# Patient Record
Sex: Male | Born: 1965 | Race: White | Hispanic: No | Marital: Married | Smoking: Former smoker
Health system: Southern US, Community
[De-identification: ages and names within clinical notes are randomized; demographics above are authoritative.]

## PROBLEM LIST (undated history)

## (undated) DIAGNOSIS — M1990 Unspecified osteoarthritis, unspecified site: Secondary | ICD-10-CM

## (undated) DIAGNOSIS — K7460 Unspecified cirrhosis of liver: Secondary | ICD-10-CM

## (undated) DIAGNOSIS — F329 Major depressive disorder, single episode, unspecified: Secondary | ICD-10-CM

## (undated) DIAGNOSIS — E119 Type 2 diabetes mellitus without complications: Secondary | ICD-10-CM

## (undated) DIAGNOSIS — K429 Umbilical hernia without obstruction or gangrene: Secondary | ICD-10-CM

## (undated) DIAGNOSIS — F32A Depression, unspecified: Secondary | ICD-10-CM

## (undated) HISTORY — PX: KNEE SURGERY: SHX244

## (undated) HISTORY — PX: HAND SURGERY: SHX662

## (undated) HISTORY — PX: FOOT SURGERY: SHX648

---

## 2003-01-01 ENCOUNTER — Emergency Department (HOSPITAL_COMMUNITY): Admission: EM | Admit: 2003-01-01 | Discharge: 2003-01-01 | Payer: Self-pay | Admitting: Emergency Medicine

## 2011-01-25 ENCOUNTER — Emergency Department (HOSPITAL_COMMUNITY)
Admission: EM | Admit: 2011-01-25 | Discharge: 2011-01-25 | Disposition: A | Discharge status: Home / Self Care | Payer: Medicaid Other | Attending: Emergency Medicine | Admitting: Emergency Medicine

## 2011-01-25 ENCOUNTER — Emergency Department (HOSPITAL_COMMUNITY): Payer: Medicaid Other

## 2011-01-25 DIAGNOSIS — Y92009 Unspecified place in unspecified non-institutional (private) residence as the place of occurrence of the external cause: Secondary | ICD-10-CM | POA: Insufficient documentation

## 2011-01-25 DIAGNOSIS — IMO0002 Reserved for concepts with insufficient information to code with codable children: Secondary | ICD-10-CM | POA: Insufficient documentation

## 2011-01-25 DIAGNOSIS — S6000XA Contusion of unspecified finger without damage to nail, initial encounter: Secondary | ICD-10-CM | POA: Insufficient documentation

## 2011-01-25 IMAGING — CR DG FINGER MIDDLE 2+V*R*
2 series · 2 of 2 positions shown · non-contrast
Comparison: None.

CLINICAL DATA: Trauma to the middle finger.  Pain.  The soft tissue
swelling

RIGHT MIDDLE FINGER 2+V

[view not recorded (1 of 2)]
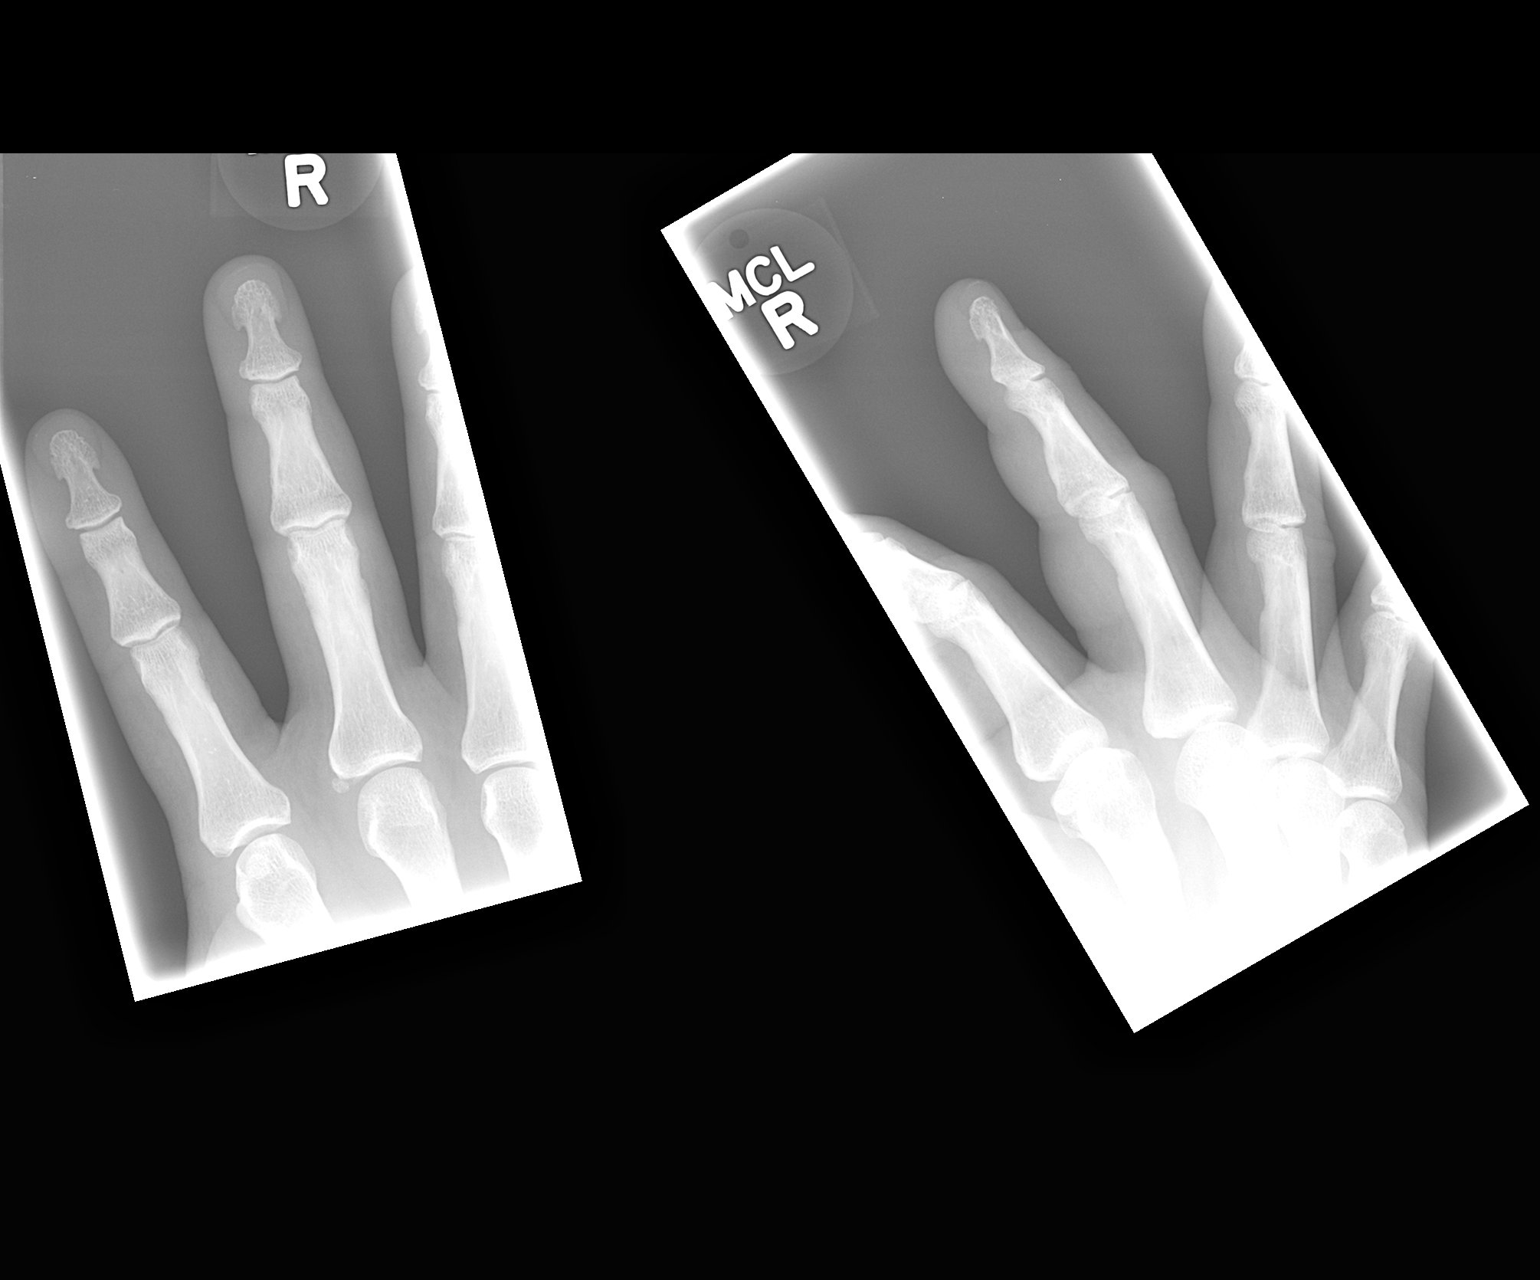

[view not recorded (2 of 2)]
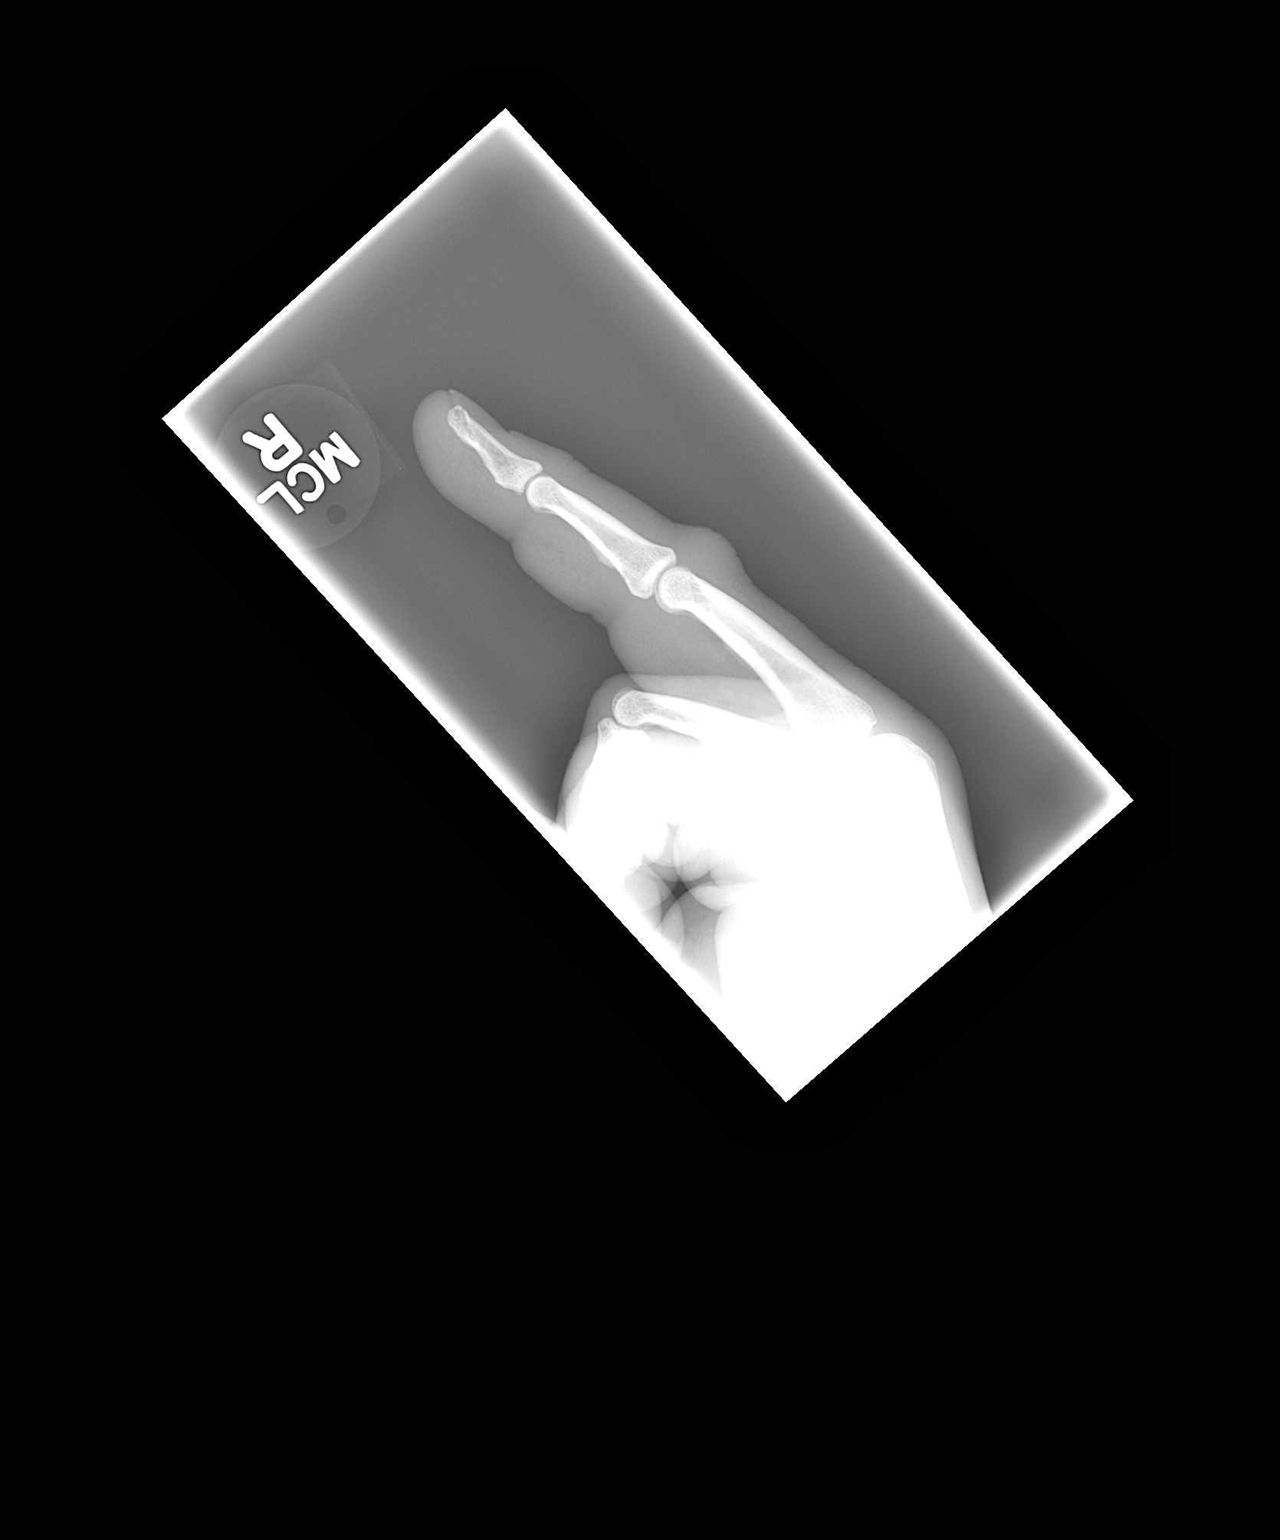

[2 of 2 positions shown; findings below may reference images not displayed]

FINDINGS: Soft tissue swelling is present over the dorsal aspect of
the PIP joint without underlying fracture dislocation.  No
radiopaque foreign body is evident.
IMPRESSION: 1.  Soft tissue swelling over the dorsum of the PIP joint.
2.  No underlying fracture or radiopaque foreign body.

## 2011-10-03 ENCOUNTER — Ambulatory Visit (INDEPENDENT_AMBULATORY_CARE_PROVIDER_SITE_OTHER): Payer: Medicaid Other | Admitting: Otolaryngology

## 2011-10-03 DIAGNOSIS — H905 Unspecified sensorineural hearing loss: Secondary | ICD-10-CM

## 2012-03-19 ENCOUNTER — Emergency Department (HOSPITAL_COMMUNITY): Payer: Medicaid Other

## 2012-03-19 ENCOUNTER — Emergency Department (HOSPITAL_COMMUNITY)
Admission: EM | Admit: 2012-03-19 | Discharge: 2012-03-19 | Disposition: A | Discharge status: Home / Self Care | Payer: Medicaid Other | Attending: Emergency Medicine | Admitting: Emergency Medicine

## 2012-03-19 ENCOUNTER — Encounter (HOSPITAL_COMMUNITY): Payer: Self-pay

## 2012-03-19 DIAGNOSIS — M25551 Pain in right hip: Secondary | ICD-10-CM

## 2012-03-19 DIAGNOSIS — L255 Unspecified contact dermatitis due to plants, except food: Secondary | ICD-10-CM

## 2012-03-19 DIAGNOSIS — IMO0002 Reserved for concepts with insufficient information to code with codable children: Secondary | ICD-10-CM | POA: Insufficient documentation

## 2012-03-19 DIAGNOSIS — M25559 Pain in unspecified hip: Secondary | ICD-10-CM | POA: Insufficient documentation

## 2012-03-19 HISTORY — DX: Unspecified osteoarthritis, unspecified site: M19.90

## 2012-03-19 IMAGING — CR DG HIP COMPLETE 2+V*R*
3 series · 3 of 3 positions shown · non-contrast
Comparison: None

CLINICAL DATA: Right hip pain, no known injury

RIGHT HIP - COMPLETE 2+ VIEW

[view not recorded (1 of 3)]
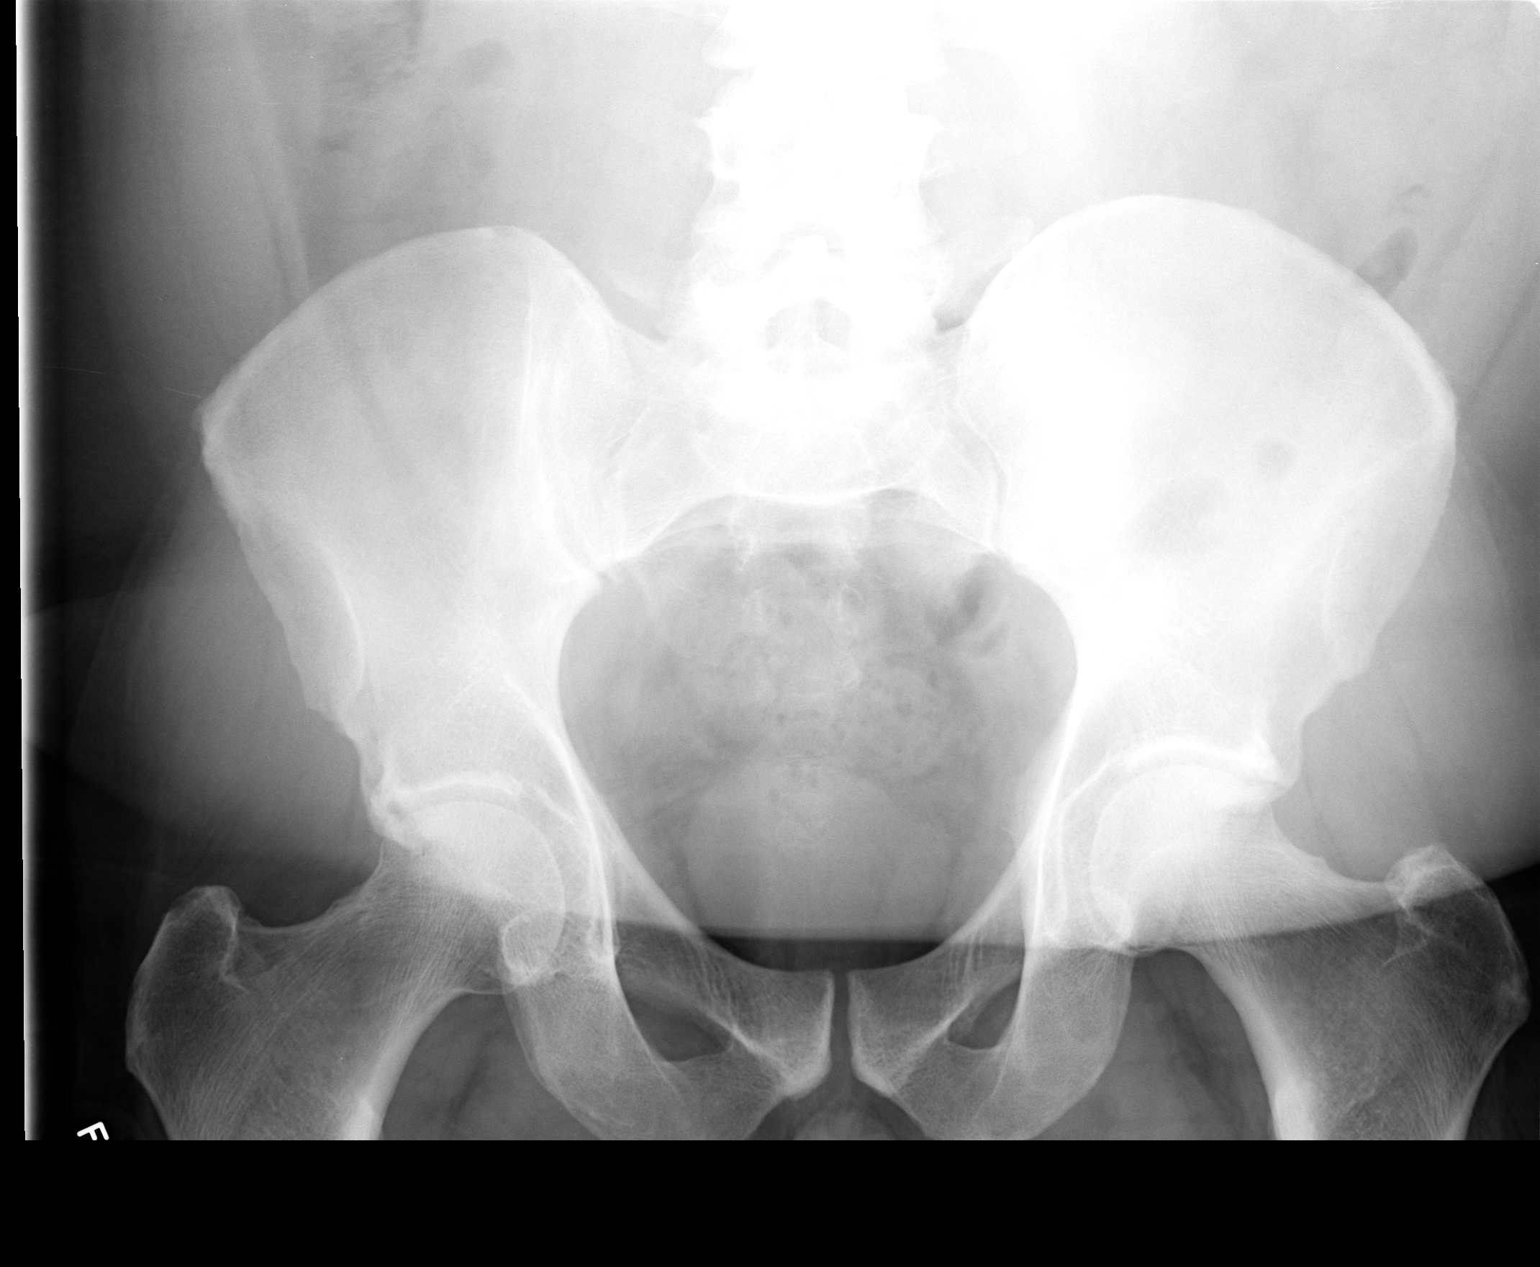

[view not recorded (2 of 3)]
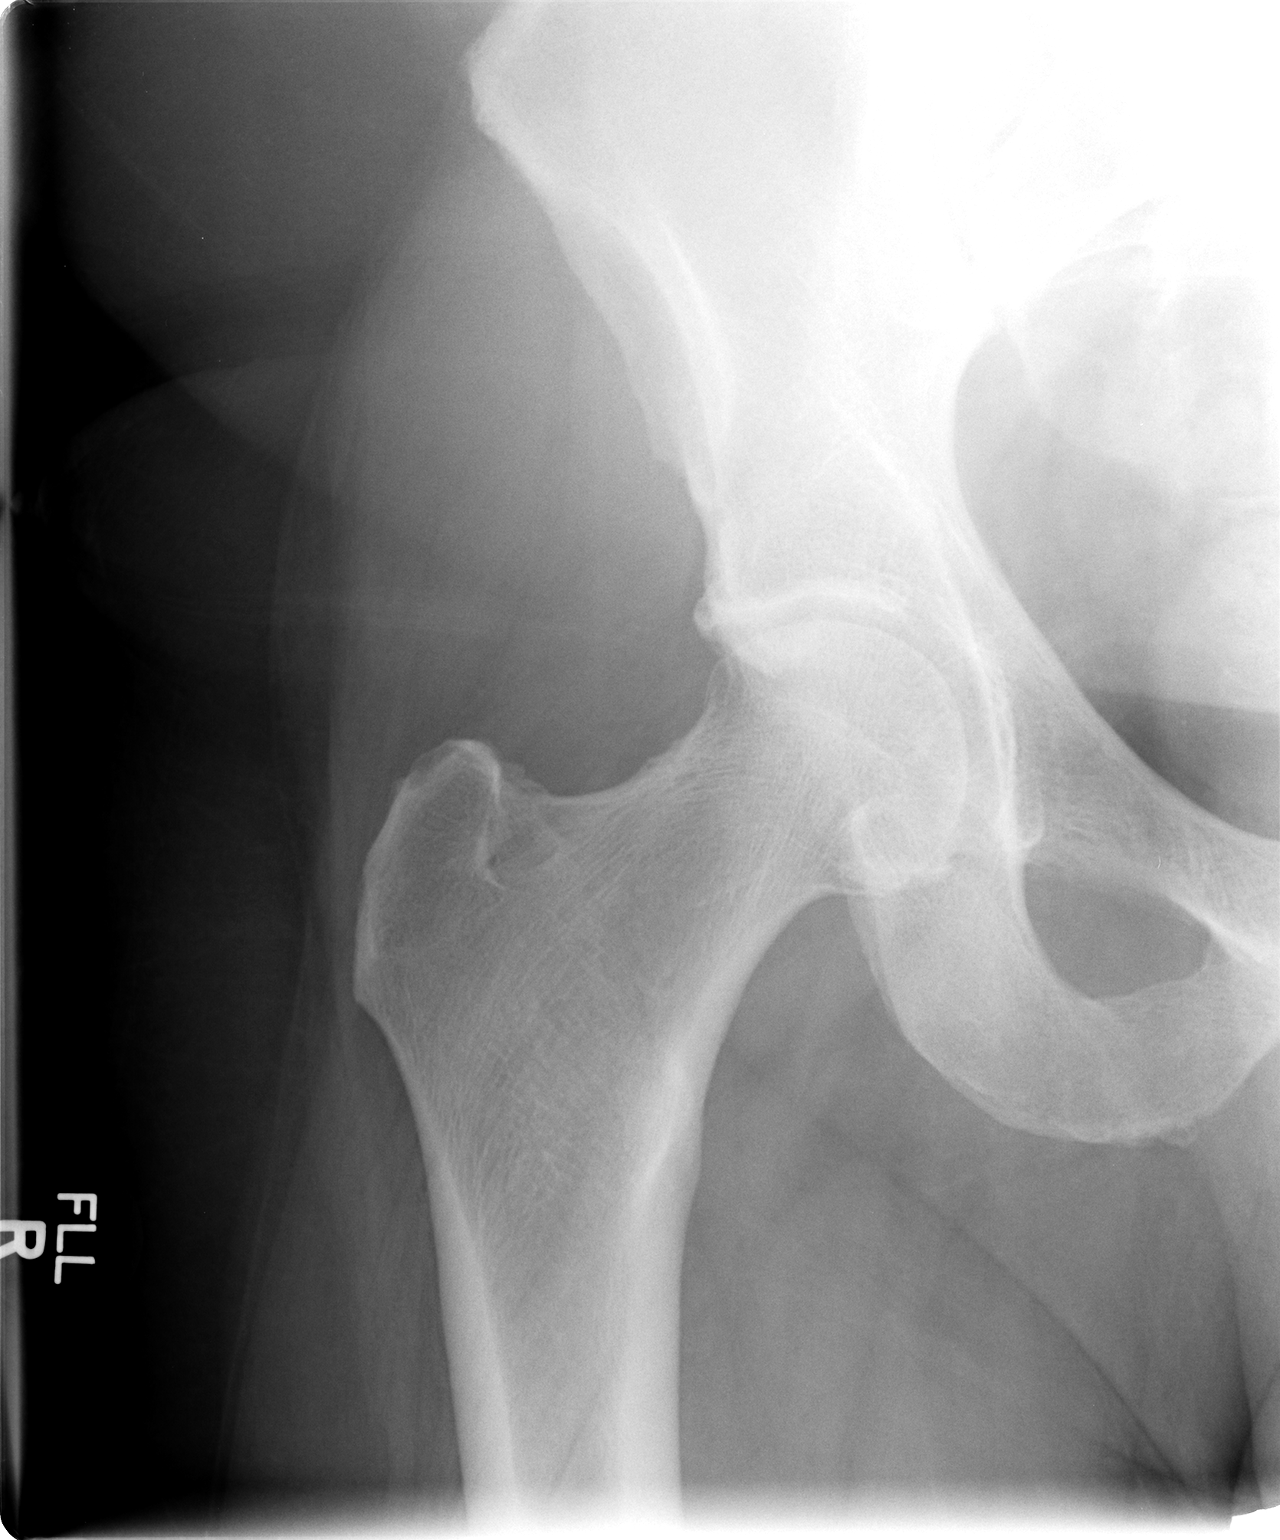

[view not recorded (3 of 3)]
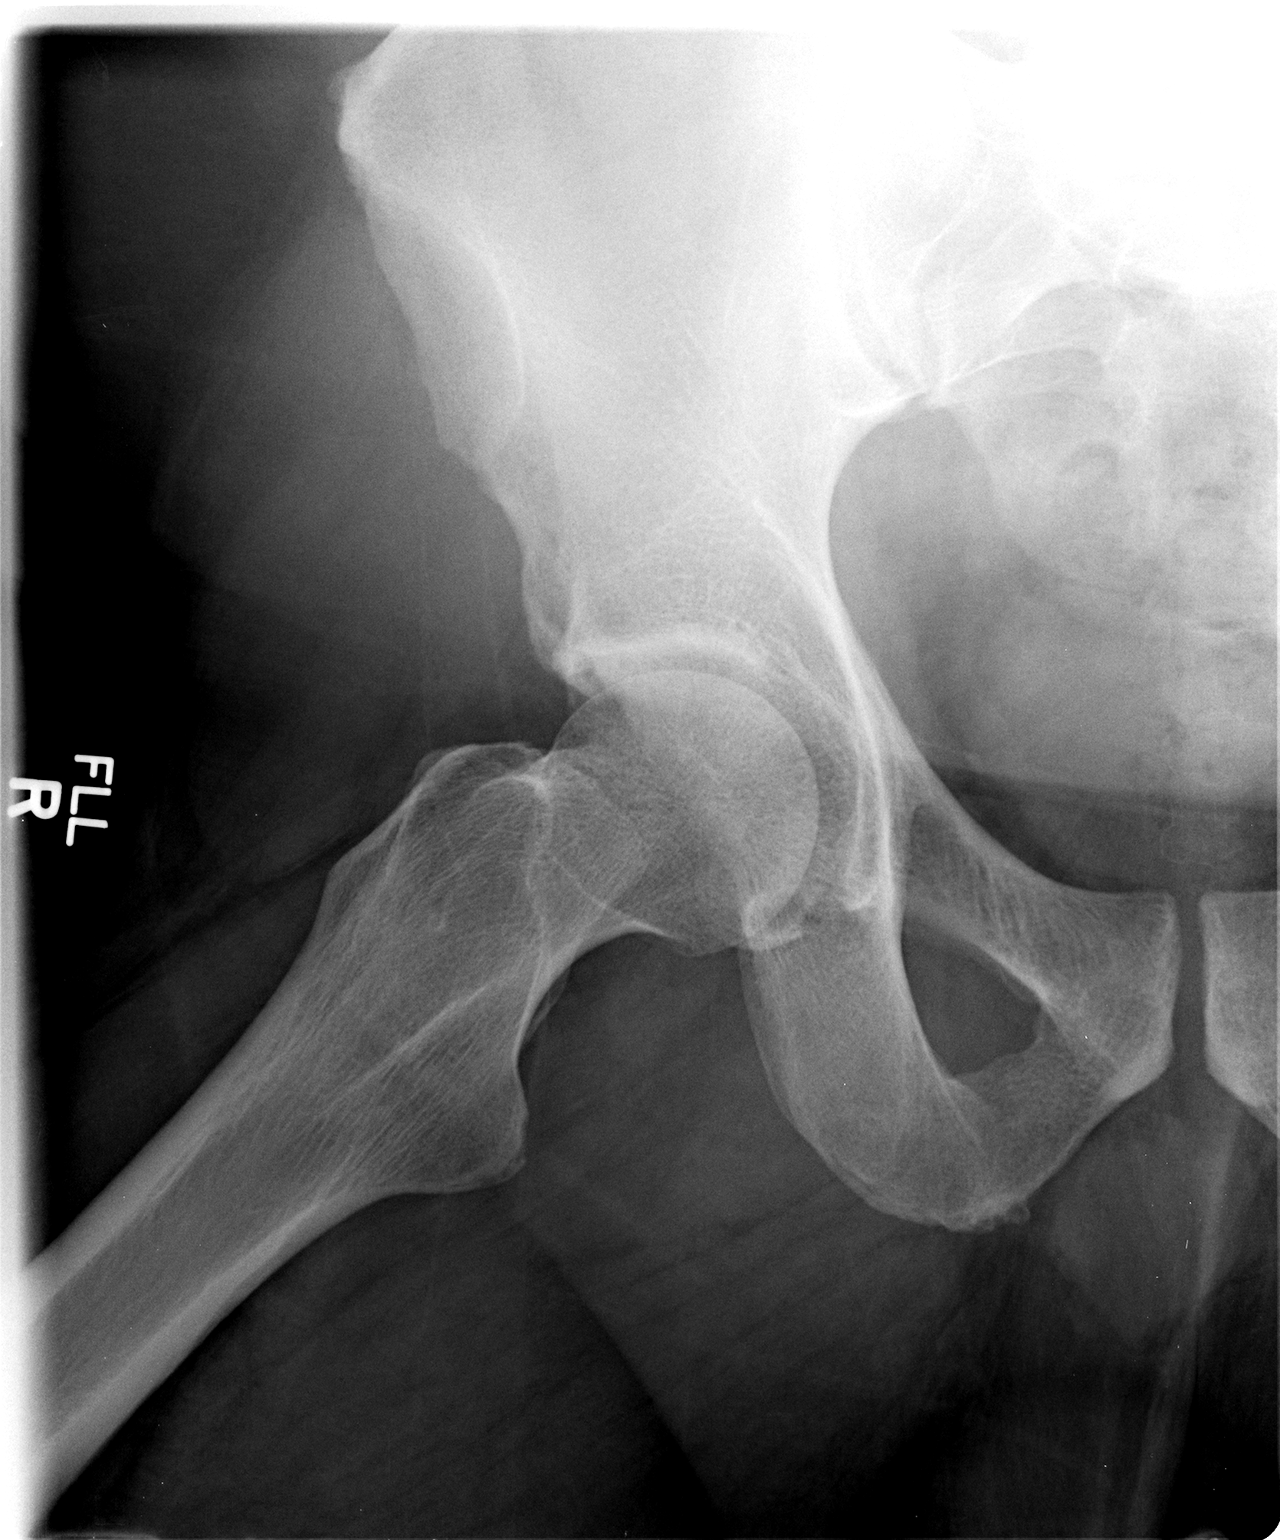

[3 of 3 positions shown; findings below may reference images not displayed]

FINDINGS: Symmetric hip and SI joints.
Osseous mineralization normal.
No acute fracture, dislocation or bone destruction.
Regional soft tissues unremarkable.
IMPRESSION: Normal exam.

## 2012-03-19 MED ORDER — PREDNISONE 20 MG PO TABS
60.0000 mg | ORAL_TABLET | Freq: Once | ORAL | Status: AC
  Administered 2012-03-19: 60 mg via ORAL
  Filled 2012-03-19: qty 3

## 2012-03-19 MED ORDER — PREDNISONE 50 MG PO TABS: ORAL_TABLET | ORAL | Status: AC

## 2012-03-19 NOTE — Discharge Instructions (Signed)
Arthralgia Your caregiver has diagnosed you as suffering from an arthralgia. Arthralgia means there is pain in a joint. This can come from many reasons including:  Bruising the joint which causes soreness (inflammation) in the joint.   Wear and tear on the joints which occur as we grow older (osteoarthritis).   Overusing the joint.   Various forms of arthritis.   Infections of the joint.  Regardless of the cause of pain in your joint, most of these different pains respond to anti-inflammatory drugs and rest. The exception to this is when a joint is infected, and these cases are treated with antibiotics, if it is a bacterial infection. HOME CARE INSTRUCTIONS   Rest the injured area for as long as directed by your caregiver. Then slowly start using the joint as directed by your caregiver and as the pain allows. Crutches as directed may be useful if the ankles, knees or hips are involved. If the knee was splinted or casted, continue use and care as directed. If an stretchy or elastic wrapping bandage has been applied today, it should be removed and re-applied every 3 to 4 hours. It should not be applied tightly, but firmly enough to keep swelling down. Watch toes and feet for swelling, bluish discoloration, coldness, numbness or excessive pain. If any of these problems (symptoms) occur, remove the ace bandage and re-apply more loosely. If these symptoms persist, contact your caregiver or return to this location.   For the first 24 hours, keep the injured extremity elevated on pillows while lying down.   Apply ice for 15 to 20 minutes to the sore joint every couple hours while awake for the first half day. Then 3 to 4 times per day for the first 48 hours. Put the ice in a plastic bag and place a towel between the bag of ice and your skin.   Wear any splinting, casting, elastic bandage applications, or slings as instructed.   Only take over-the-counter or prescription medicines for pain,  discomfort, or fever as directed by your caregiver. Do not use aspirin immediately after the injury unless instructed by your physician. Aspirin can cause increased bleeding and bruising of the tissues.   If you were given crutches, continue to use them as instructed and do not resume weight bearing on the sore joint until instructed.  Persistent pain and inability to use the sore joint as directed for more than 2 to 3 days are warning signs indicating that you should see a caregiver for a follow-up visit as soon as possible. Initially, a hairline fracture (break in bone) may not be evident on X-rays. Persistent pain and swelling indicate that further evaluation, non-weight bearing or use of the joint (use of crutches or slings as instructed), or further X-rays are indicated. X-rays may sometimes not show a small fracture until a week or 10 days later. Make a follow-up appointment with your own caregiver or one to whom we have referred you. A radiologist (specialist in reading X-rays) may read your X-rays. Make sure you know how you are to obtain your X-ray results. Do not assume everything is normal if you do not hear from us. SEEK MEDICAL CARE IF: Bruising, swelling, or pain increases. SEEK IMMEDIATE MEDICAL CARE IF:   Your fingers or toes are numb or blue.   The pain is not responding to medications and continues to stay the same or get worse.   The pain in your joint becomes severe.   You develop a fever over   102 F (38.9 C).   It becomes impossible to move or use the joint.  MAKE SURE YOU:   Understand these instructions.   Will watch your condition.   Will get help right away if you are not doing well or get worse.  Document Released: 09/16/2005 Document Revised: 09/05/2011 Document Reviewed: 05/04/2008 [facility] Patient Information 2012 [place], [place].Poison [employer] ivy is a inflammation of the skin (contact dermatitis) caused by touching the allergens on the leaves of the ivy  plant following previous exposure to the plant. The rash usually appears 48 hours after exposure. The rash is usually bumps (papules) or blisters (vesicles) in a linear pattern. Depending on your own sensitivity, the rash may simply cause redness and itching, or it may also progress to blisters which may break open. These must be well cared for to prevent secondary bacterial (germ) infection, followed by scarring. Keep any open areas dry, clean, dressed, and covered with an antibacterial ointment if needed. The eyes may also get puffy. The puffiness is worst in the morning and gets better as the day progresses. This dermatitis usually heals without scarring, within 2 to 3 weeks without treatment. HOME CARE INSTRUCTIONS  Thoroughly wash with soap and water as soon as you have been exposed to poison ivy. You have about one half hour to remove the plant resin before it will cause the rash. This washing will destroy the oil or antigen on the skin that is causing, or will cause, the rash. Be sure to wash under your fingernails as any plant resin there will continue to spread the rash. Do not rub skin vigorously when washing affected area. Poison ivy cannot spread if no oil from the plant remains on your body. A rash that has progressed to weeping sores will not spread the rash unless you have not washed thoroughly. It is also important to wash any clothes you have been wearing as these may carry active allergens. The rash will return if you wear the unwashed clothing, even several days later. Avoidance of the plant in the future is the best measure. Poison ivy plant can be recognized by the number of leaves. Generally, poison ivy has three leaves with flowering branches on a single stem. Diphenhydramine may be purchased over the counter and used as needed for itching. Do not drive with this medication if it makes you drowsy.Ask your caregiver about medication for children. SEEK MEDICAL CARE IF:  Open sores  develop.   Redness spreads beyond area of rash.   You notice purulent (pus-like) discharge.   You have increased pain.   Other signs of infection develop (such as fever).  Document Released: 09/13/2000 Document Revised: 09/05/2011 Document Reviewed: 08/02/2009 [facility] Patient Information 2012 [place], [place].   The x-rays of your right hip appear normal.  Take the prednisone both for the hip inflammation and the poison sumac.  Follow up with your MD as needed.

## 2012-03-19 NOTE — ED Notes (Signed)
1. Right hip pain x4 days, denies any known injury, usually uses a cane to walk on same ext. 2. Rash to body for 1 week.  Wants that checked

## 2012-03-19 NOTE — ED Notes (Signed)
Pt c/o rt hip pain due to the fact he has not been able to use his cane as advised due to itching with the poison oak.

## 2012-03-19 NOTE — ED Provider Notes (Signed)
History     CSN: [id number]  Arrival date & time 03/19/12  1026   First MD Initiated Contact with Patient 03/19/12 1058      Chief Complaint  Patient presents with  . 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  Normally uses a cane.  Hip hurts to walk.  Denies specific injury.  Pt also has been clearing vines to build a dog lot and thinks he got into poison sumac.  Has lesions all over both arms.  Patient is a 46 y.o. male presenting with hip pain. The history is provided by the patient. No language interpreter was used.  Hip Pain This is a chronic problem. The problem occurs constantly. The problem has been gradually worsening. Pertinent negatives include no chills, fever or weakness. The symptoms are aggravated by walking and standing. He has tried nothing for the symptoms.    Past Medical History  Diagnosis Date  . Arthritis     Past Surgical History  Procedure Date  . Foot surgery   . Knee surgery     No family history on file.  History  Substance Use Topics  . Smoking status: Never Smoker   . Smokeless tobacco: Not on file  . Alcohol Use: No      Review of Systems  Constitutional: Negative for fever and chills.  Musculoskeletal: Positive for gait problem. Negative for back pain.  Neurological: Negative for weakness.  All other systems reviewed and are negative.    Allergies  Review of patient's allergies indicates no known allergies.  Home Medications   Current Outpatient Rx  Name Route Sig Dispense Refill  . PREDNISONE 50 MG PO TABS  One tab po QD 6 tablet 0    BP 132/77  Pulse 74  Temp 98.5 F (36.9 C) (Oral)  Resp 18  Ht 5\' 11"  (1.803 m)  Wt 290 lb (131.543 kg)  BMI 40.45 kg/m2  SpO2 98%  Physical Exam  Nursing note and vitals reviewed. Constitutional: He is oriented  to person, place, and time. He appears well-developed and well-nourished.  HENT:  Head: Normocephalic and atraumatic.  Eyes: EOM are normal.  Neck: Normal range of motion.  Cardiovascular: Normal rate, regular rhythm, normal heart sounds and intact distal pulses.   Pulmonary/Chest: Effort normal and breath sounds normal. No respiratory distress.  Abdominal: Soft. He exhibits no distension. There is no tenderness.  Musculoskeletal: He exhibits tenderness.       Right hip: He exhibits decreased range of motion, tenderness and bony tenderness. He exhibits no crepitus and no deformity.       Legs: Neurological: He is alert and oriented to person, place, and time.  Skin: Skin is warm and dry. Rash noted. Rash is vesicular.       Blistery, red lesions scattered over B arms c/w rhus dermatitis.  Psychiatric: He has a normal mood and affect. Judgment normal.    ED Course  Procedures (including critical care time)  Labs Reviewed - No data to display Dg Hip Complete Right  03/19/2012  *RADIOLOGY REPORT*  Clinical Data: Right hip pain, no known injury  RIGHT HIP - COMPLETE 2+ VIEW  Comparison: None  Findings: Symmetric hip and SI joints. Osseous mineralization normal. No acute fracture, dislocation or bone destruction. Regional soft tissues unremarkable.  IMPRESSION: Normal exam.  Original Report Authenticated By: Lollie Marrow,  M.D.     1. Right hip pain   2. Rhus dermatitis       MDM  Non-traumatic hip pain and rhus dermatitis.  rx-prednisone 50 mg QD, 6        Worthy Rancher, [place] 03/19/12 1240

## 2012-03-20 NOTE — ED Provider Notes (Signed)
Medical screening examination/treatment/procedure(s) were performed by non-physician practitioner and as supervising physician I was immediately available for consultation/collaboration.   Kirsty Monjaraz W Damaria Vachon, MD 03/20/12 2326 

## 2012-10-08 ENCOUNTER — Ambulatory Visit (INDEPENDENT_AMBULATORY_CARE_PROVIDER_SITE_OTHER): Payer: Medicaid Other | Admitting: Otolaryngology

## 2013-02-26 ENCOUNTER — Emergency Department (HOSPITAL_COMMUNITY): Payer: Medicaid Other

## 2013-02-26 ENCOUNTER — Encounter (HOSPITAL_COMMUNITY): Payer: Self-pay | Admitting: *Deleted

## 2013-02-26 ENCOUNTER — Emergency Department (HOSPITAL_COMMUNITY)
Admission: EM | Admit: 2013-02-26 | Discharge: 2013-02-26 | Disposition: A | Discharge status: Home / Self Care | Payer: Medicaid Other | Attending: Emergency Medicine | Admitting: Emergency Medicine

## 2013-02-26 DIAGNOSIS — J0100 Acute maxillary sinusitis, unspecified: Secondary | ICD-10-CM | POA: Insufficient documentation

## 2013-02-26 DIAGNOSIS — Z8739 Personal history of other diseases of the musculoskeletal system and connective tissue: Secondary | ICD-10-CM | POA: Insufficient documentation

## 2013-02-26 DIAGNOSIS — R51 Headache: Secondary | ICD-10-CM | POA: Insufficient documentation

## 2013-02-26 LAB — BASIC METABOLIC PANEL
BUN: 15 mg/dL (ref 6–23)
CO2: 28 mEq/L (ref 19–32)
Chloride: 108 mEq/L (ref 96–112)
GFR calc Af Amer: >90 mL/min (ref >90)
Potassium: 3.4 mEq/L — ABNORMAL LOW (ref 3.5–5.1)

## 2013-02-26 IMAGING — CT CT MAXILLOFACIAL W/ CM
3 series · 16 of 47 positions shown, 19 images · IV contrast (omnipaque)
Comparison: None.

CLINICAL DATA: Left facial pain and swelling.  Recent dental
infection.  Evaluate for abscess.

CT MAXILLOFACIAL WITH CONTRAST
TECHNIQUE: Multidetector CT imaging of the maxillofacial
structures was performed with intravenous contrast. Multiplanar CT
image reconstructions were also generated.
Contrast: 80mL OMNIPAQUE IOHEXOL 300 MG/ML  SOLN

[Series 2: max st axial · axial · 0.38mm/px · z∈[+14,+166]mm · 10 of 90 slices shown, 13 images]
[im 7/90  brain]
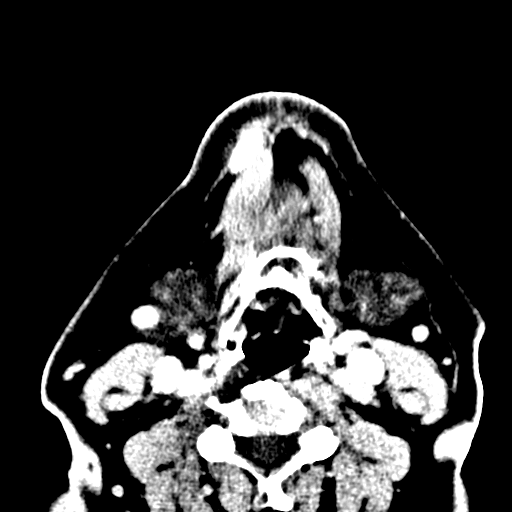
[im 7/90  bone]
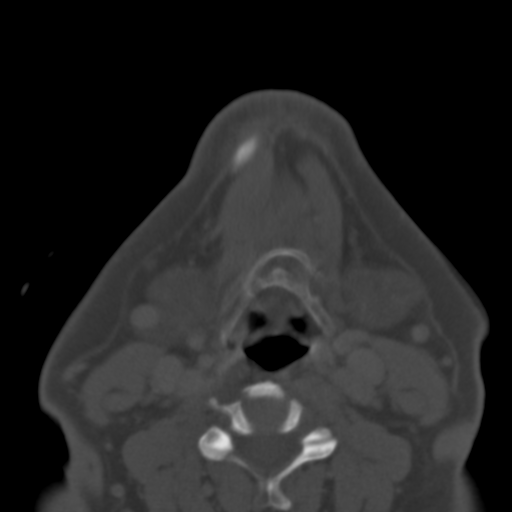
[im 16/90  bone]
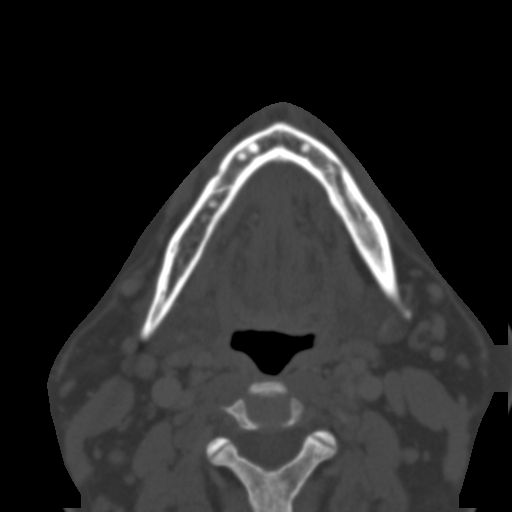
[im 25/90  bone]
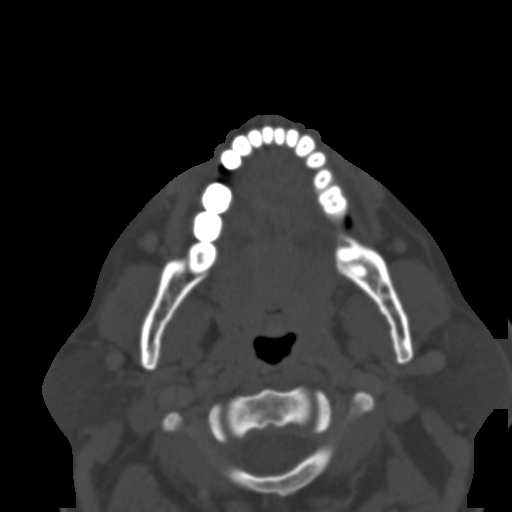
[im 31/90  bone]
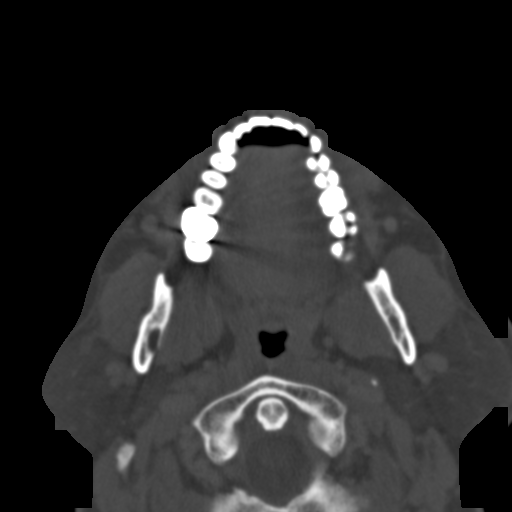
[im 40/90  brain]
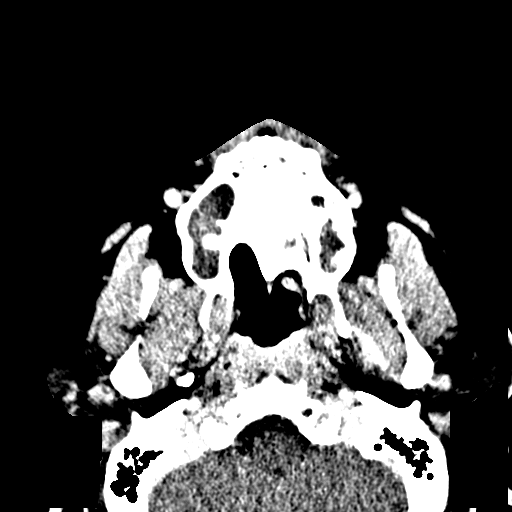
[im 40/90  bone]
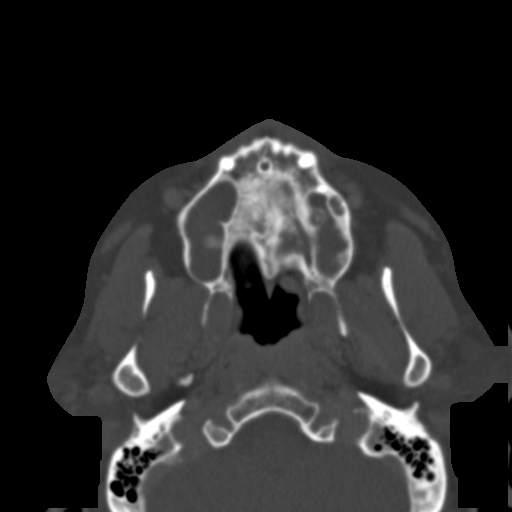
[im 50/90  bone]
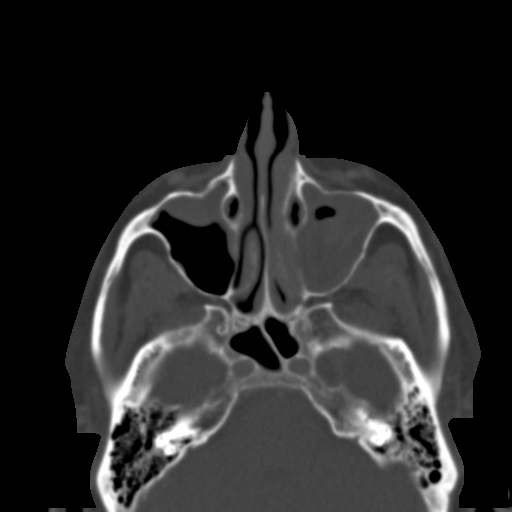
[im 59/90  bone]
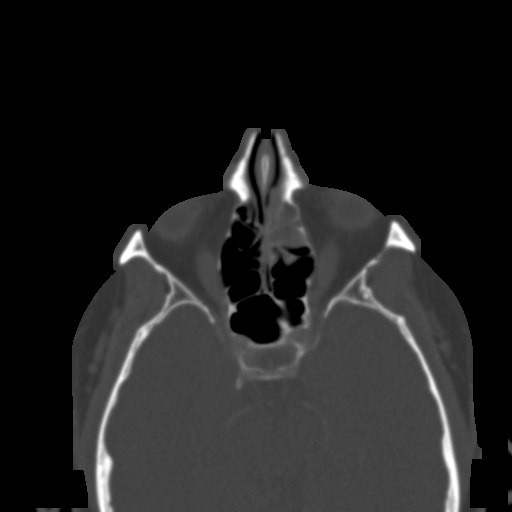
[im 68/90  bone]
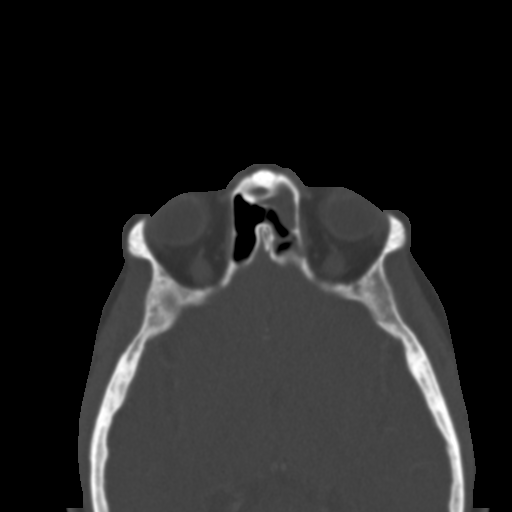
[im 74/90  brain]
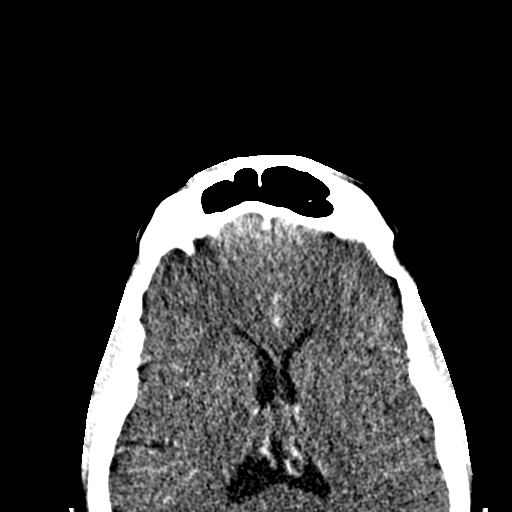
[im 74/90  bone]
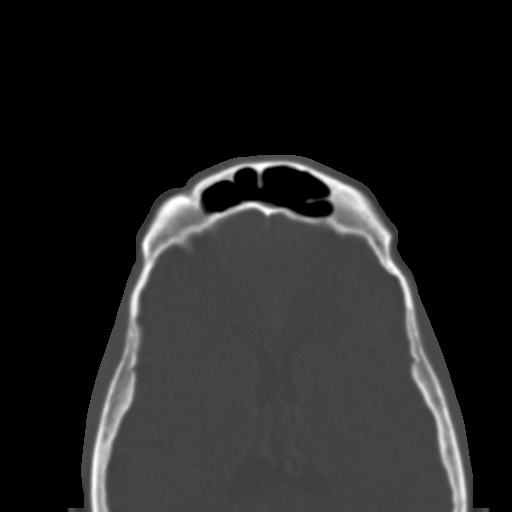
[im 83/90  bone]
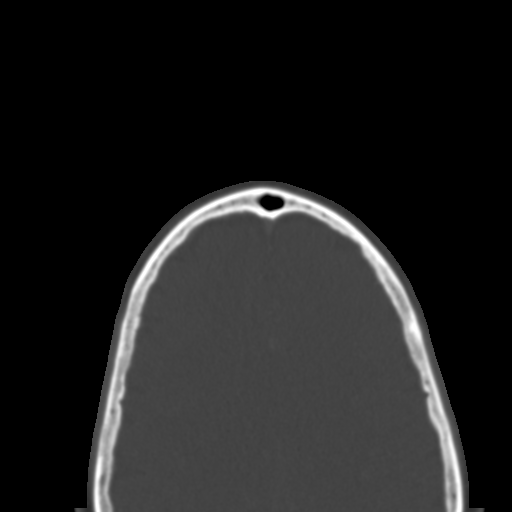

[Series 4: max st coro · coronal · 0.33mm/px · 3 of 78 slices shown]
[im 26/78  bone]
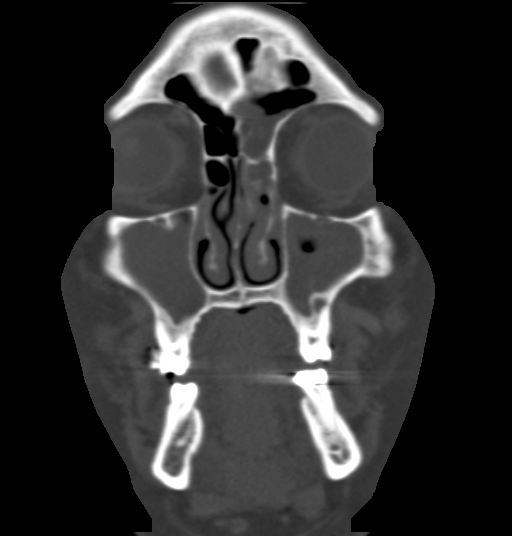
[im 35/78  bone]
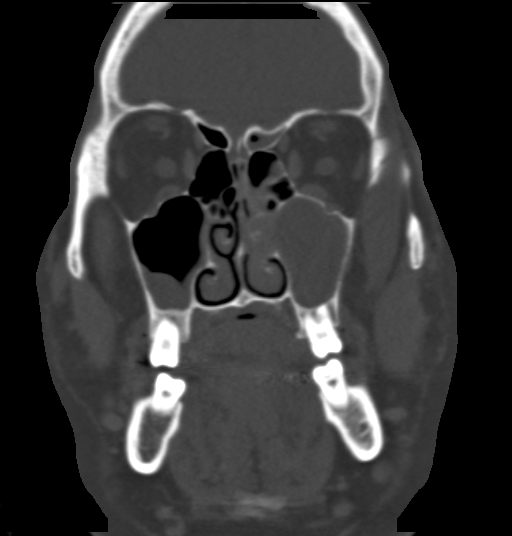
[im 43/78  bone]
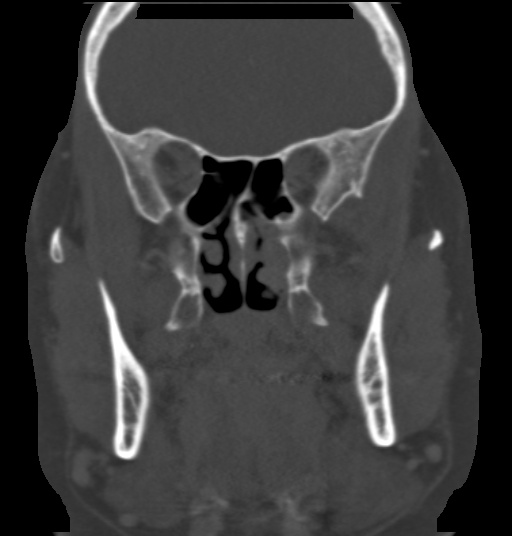

[Series 5: max st sag · sagittal · 0.31mm/px · 3 of 87 slices shown]
[im 29/87  bone]
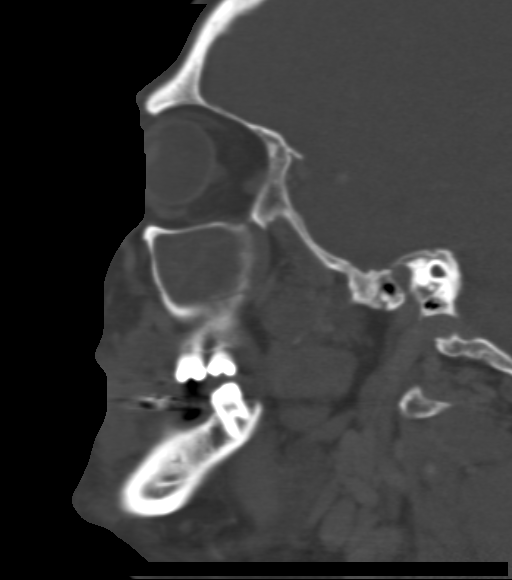
[im 44/87  bone]
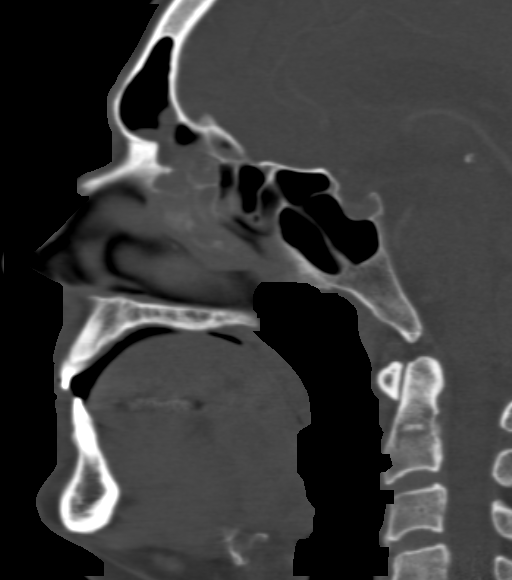
[im 58/87  bone]
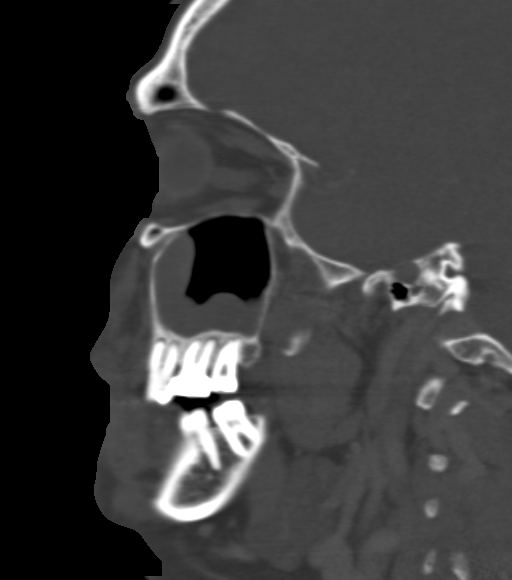

[16 of 47 positions shown; findings below may reference images not displayed]

FINDINGS: No evidence of orbital or facial bone fractures.  Globes
and intraorbital anatomy are normal appearance.  There is no
evidence of facial inflammatory process or abscess.  No evidence of
orbital emphysema.

Periapical lucency is seen surrounding the roots of the right
second maxillary molar and the left first maxillary molar. There is
near complete opacification the left maxillary sinus and extensive
mucosal thickening involving the right maxillary sinus.  In
addition, there is mucosal thickening involving the left frontal
and ethmoid sinus.
IMPRESSION: 1.  Peri apical lucency involving roots of the right second and
left first maxillary molars.
2.  Near complete opacification of left maxillary sinus, with
significant mucosal thickening also involving the right maxillary,
left ethmoid, and left frontal sinuses.
3.  No evidence of facial or orbital soft tissue inflammatory
process or abscess.

## 2013-02-26 MED ORDER — KETOROLAC TROMETHAMINE 10 MG PO TABS
10.0000 mg | ORAL_TABLET | Freq: Once | ORAL | Status: AC
  Administered 2013-02-26: 10 mg via ORAL
  Filled 2013-02-26: qty 1

## 2013-02-26 MED ORDER — DEXTROSE 5 % IV SOLN
1.0000 g | Freq: Once | INTRAVENOUS | Status: AC
  Administered 2013-02-26: 1 g via INTRAVENOUS
  Filled 2013-02-26: qty 10

## 2013-02-26 MED ORDER — CEFTRIAXONE SODIUM 1 G IJ SOLR: 1.0000 g | Freq: Once | INTRAMUSCULAR | Status: DC

## 2013-02-26 MED ORDER — IOHEXOL 300 MG/ML  SOLN
80.0000 mL | Freq: Once | INTRAMUSCULAR | Status: AC | PRN
  Administered 2013-02-26: 80 mL via INTRAVENOUS

## 2013-02-26 MED ORDER — DICLOFENAC SODIUM 75 MG PO TBEC: 75.0000 mg | DELAYED_RELEASE_TABLET | Freq: Two times a day (BID) | ORAL | Status: DC

## 2013-02-26 MED ORDER — PSEUDOEPHEDRINE HCL ER 120 MG PO TB12: 120.0000 mg | ORAL_TABLET | Freq: Two times a day (BID) | ORAL | Status: DC

## 2013-02-26 MED ORDER — ONDANSETRON HCL 4 MG PO TABS
4.0000 mg | ORAL_TABLET | Freq: Once | ORAL | Status: AC
  Administered 2013-02-26: 4 mg via ORAL
  Filled 2013-02-26: qty 1

## 2013-02-26 MED ORDER — DOXYCYCLINE HYCLATE 100 MG PO CAPS: 100.0000 mg | ORAL_CAPSULE | Freq: Two times a day (BID) | ORAL | Status: AC

## 2013-02-26 NOTE — ED Notes (Signed)
Pt thinks he has a sinus infection. Had recent  Tooth abscess.  And treated for that.

## 2013-02-26 NOTE — ED Provider Notes (Signed)
History     CSN: [id number]  Arrival date & time 02/26/13  1237   First MD Initiated Contact with Patient 02/26/13 1316      Chief Complaint  Patient presents with  . Nasal Congestion    (Consider location/radiation/quality/duration/timing/severity/associated sxs/prior treatment) Patient is a 47 y.o. male presenting with URI. The history is provided by the patient.  URI Presenting symptoms: congestion, facial pain and rhinorrhea   Presenting symptoms: no cough   Severity:  Moderate Onset quality:  Gradual Duration:  3 days Timing:  Constant Progression:  Worsening Chronicity:  Recurrent Relieved by:  Nothing Worsened by:  Nothing tried Ineffective treatments:  Decongestant and OTC medications Associated symptoms: headaches and sinus pain   Associated symptoms: no arthralgias, no neck pain and no wheezing     Past Medical History  Diagnosis Date  . Arthritis     Past Surgical History  Procedure Laterality Date  . Foot surgery    . Knee surgery      History reviewed. No pertinent family history.  History  Substance Use Topics  . Smoking status: Never Smoker   . Smokeless tobacco: Not on file  . Alcohol Use: No      Review of Systems  Constitutional: Negative for activity change.       All ROS Neg except as noted in HPI  HENT: Positive for congestion and rhinorrhea. Negative for nosebleeds and neck pain.   Eyes: Negative for photophobia and discharge.  Respiratory: Negative for cough, shortness of breath and wheezing.   Cardiovascular: Negative for chest pain and palpitations.  Gastrointestinal: Negative for abdominal pain and blood in stool.  Genitourinary: Negative for dysuria, frequency and hematuria.  Musculoskeletal: Negative for back pain and arthralgias.  Skin: Negative.   Neurological: Positive for headaches. Negative for dizziness, seizures and speech difficulty.  Psychiatric/Behavioral: Negative for hallucinations and confusion.    Allergies   Review of patient's allergies indicates no known allergies.  Home Medications  No current outpatient prescriptions on file.  BP 133/81  Pulse 86  Temp(Src) 98.2 F (36.8 C) (Oral)  Resp 20  Ht 5\' 11"  (1.803 m)  Wt 280 lb (127.007 kg)  BMI 39.07 kg/m2  SpO2 98%  Physical Exam  Nursing note and vitals reviewed. Constitutional: He is oriented to person, place, and time. He appears well-developed and well-nourished.  Non-toxic appearance.  HENT:  Head: Normocephalic.  Right Ear: Tympanic membrane and external ear normal.  Left Ear: Tympanic membrane and external ear normal.  Pain to percusion over the maxillary sinuses.Left facial swelling and tenderness.  No hot or red areas of the face. Nasal congestion.   Eyes: EOM and lids are normal. Pupils are equal, round, and reactive to light.  Neck: Normal range of motion. Neck supple. Carotid bruit is not present.  Cardiovascular: Normal rate, regular rhythm, normal heart sounds, intact distal pulses and normal pulses.   Pulmonary/Chest: Breath sounds normal. No respiratory distress.  Abdominal: Soft. Bowel sounds are normal. There is no tenderness. There is no guarding.  Musculoskeletal: Normal range of motion.  Lymphadenopathy:       Head (right side): No submandibular adenopathy present.       Head (left side): No submandibular adenopathy present.    He has no cervical adenopathy.  Neurological: He is alert and oriented to person, place, and time. He has normal strength. No cranial nerve deficit or sensory deficit.  Skin: Skin is warm and dry.  Psychiatric: He has a normal mood and  affect. His speech is normal.    ED Course  Procedures (including critical care time)  Labs Reviewed - No data to display No results found.   No diagnosis found.    MDM  I have reviewed nursing notes, vital signs, and all appropriate lab and imaging results for this patient. Pt presents to the ED with c/o left facial swelling and nasal  congestion. He has dental problems recently.  CT reveal nearly complete opacification of the left maxillary sinus and right maxillary and left frontal sinuses. No abscess noted.       Kathie Dike, PA-C 03/02/13 1309

## 2013-02-26 NOTE — ED Notes (Signed)
Pt alert & oriented x4, stable gait. Patient given discharge instructions, paperwork & prescription(s). Patient  instructed to stop at the registration desk to finish any additional paperwork. Patient verbalized understanding. Pt left department w/ no further questions. 

## 2013-03-02 NOTE — ED Provider Notes (Signed)
Medical screening examination/treatment/procedure(s) were performed by non-physician practitioner and as supervising physician I was immediately available for consultation/collaboration.  Donnetta Hutching, MD 03/02/13 [phone]

## 2014-04-25 ENCOUNTER — Encounter (HOSPITAL_COMMUNITY): Payer: Self-pay | Admitting: Emergency Medicine

## 2014-04-25 ENCOUNTER — Emergency Department (HOSPITAL_COMMUNITY): Payer: Medicare Other

## 2014-04-25 ENCOUNTER — Emergency Department (HOSPITAL_COMMUNITY)
Admission: EM | Admit: 2014-04-25 | Discharge: 2014-04-25 | Disposition: A | Discharge status: Home / Self Care | Payer: Medicare Other | Attending: Emergency Medicine | Admitting: Emergency Medicine

## 2014-04-25 DIAGNOSIS — Z8739 Personal history of other diseases of the musculoskeletal system and connective tissue: Secondary | ICD-10-CM | POA: Diagnosis not present

## 2014-04-25 DIAGNOSIS — Y9389 Activity, other specified: Secondary | ICD-10-CM | POA: Diagnosis not present

## 2014-04-25 DIAGNOSIS — S61409A Unspecified open wound of unspecified hand, initial encounter: Secondary | ICD-10-CM | POA: Insufficient documentation

## 2014-04-25 DIAGNOSIS — Z23 Encounter for immunization: Secondary | ICD-10-CM | POA: Insufficient documentation

## 2014-04-25 DIAGNOSIS — W268XXA Contact with other sharp object(s), not elsewhere classified, initial encounter: Secondary | ICD-10-CM | POA: Diagnosis not present

## 2014-04-25 DIAGNOSIS — Y929 Unspecified place or not applicable: Secondary | ICD-10-CM | POA: Diagnosis not present

## 2014-04-25 DIAGNOSIS — S61412A Laceration without foreign body of left hand, initial encounter: Secondary | ICD-10-CM

## 2014-04-25 DIAGNOSIS — Z8659 Personal history of other mental and behavioral disorders: Secondary | ICD-10-CM | POA: Insufficient documentation

## 2014-04-25 HISTORY — DX: Major depressive disorder, single episode, unspecified: F32.9

## 2014-04-25 HISTORY — DX: Depression, unspecified: F32.A

## 2014-04-25 IMAGING — CR DG HAND COMPLETE 3+V*L*
3 series · 3 of 3 positions shown · non-contrast
Comparison: None.

CLINICAL DATA: Extremity laceration. Tripped and fell today.
Laceration to index finger at MCP joint and middle finger head MCP
joint and PIP joint.

EXAM:
LEFT HAND - COMPLETE 3+ VIEW

[view not recorded (1 of 3)]
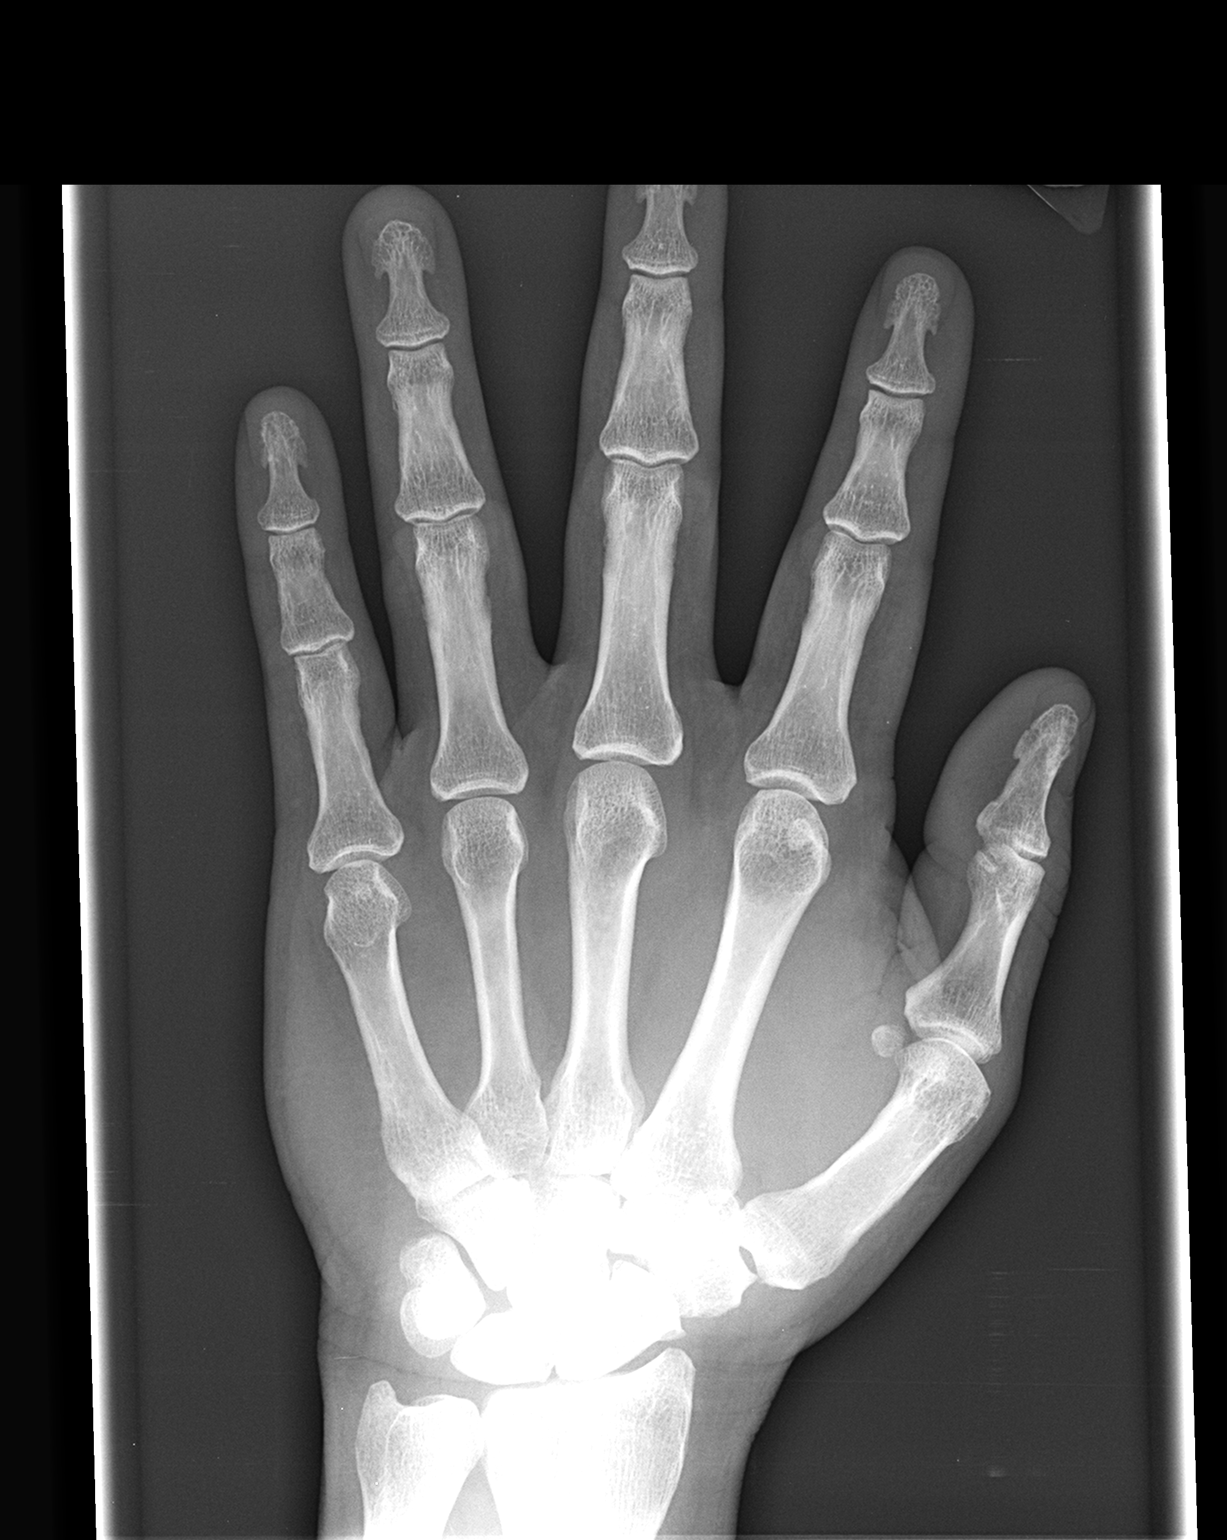

[view not recorded (2 of 3)]
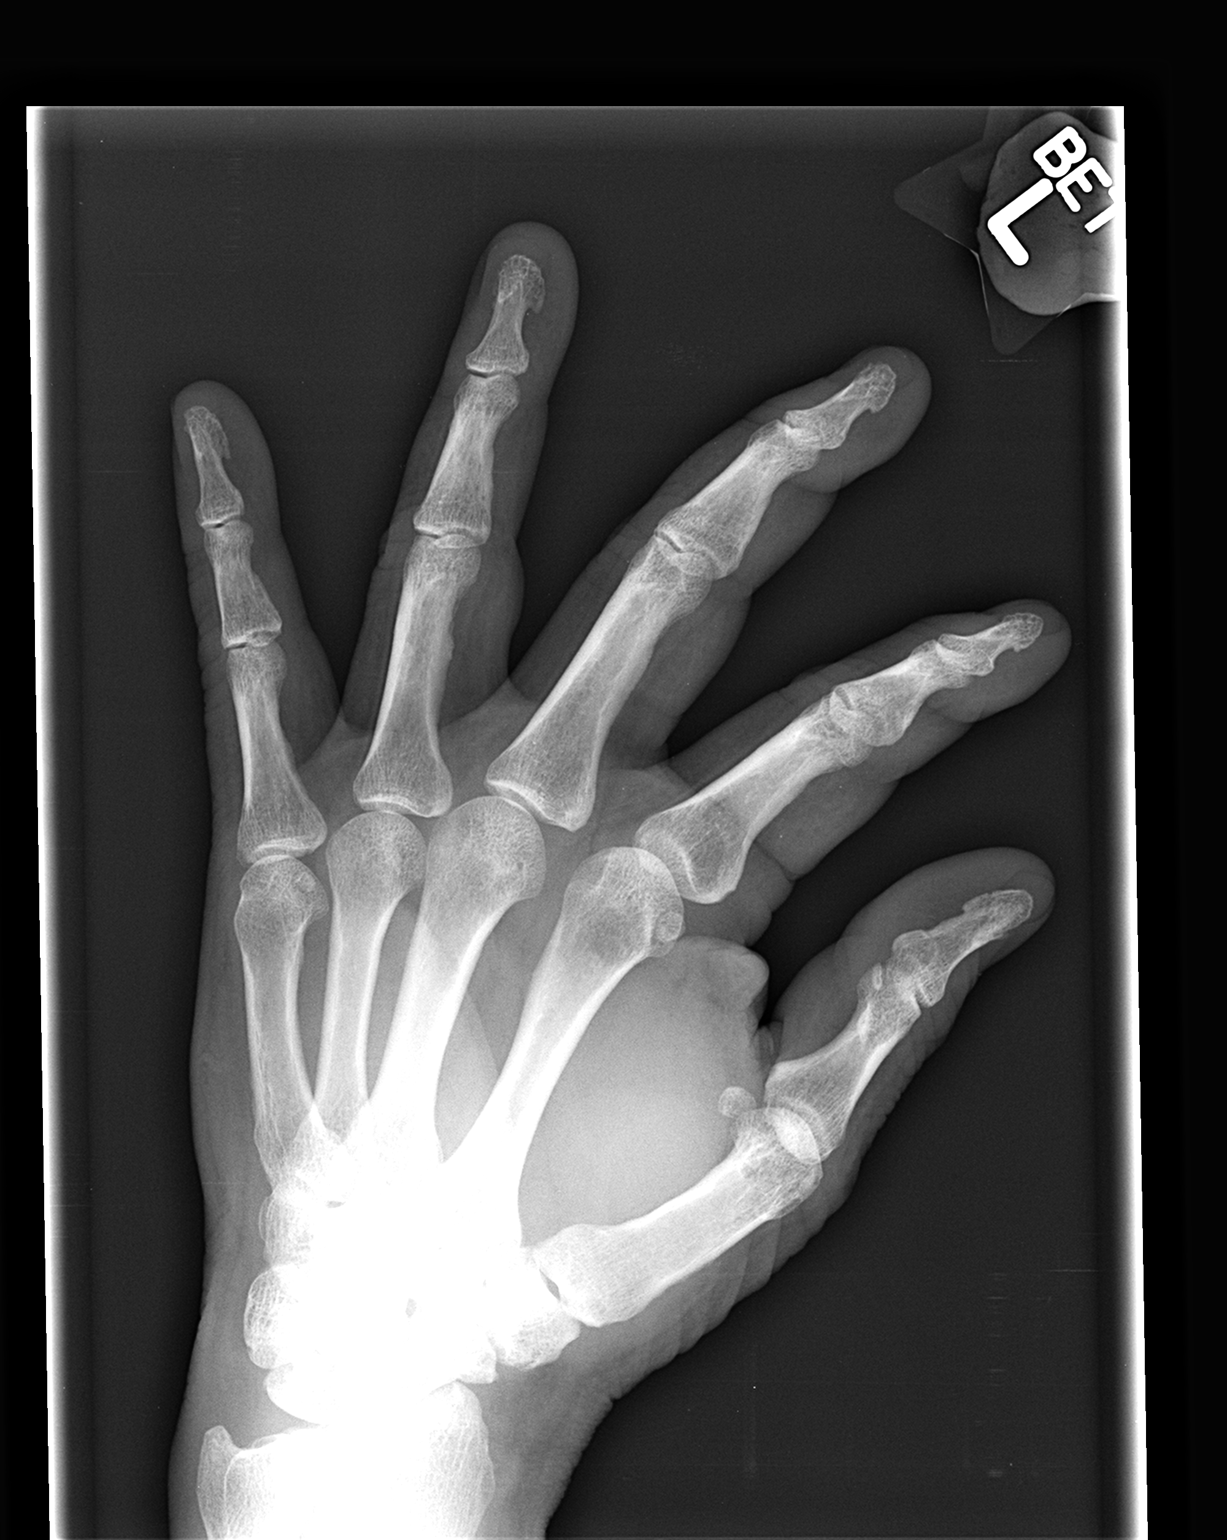

[view not recorded (3 of 3)]
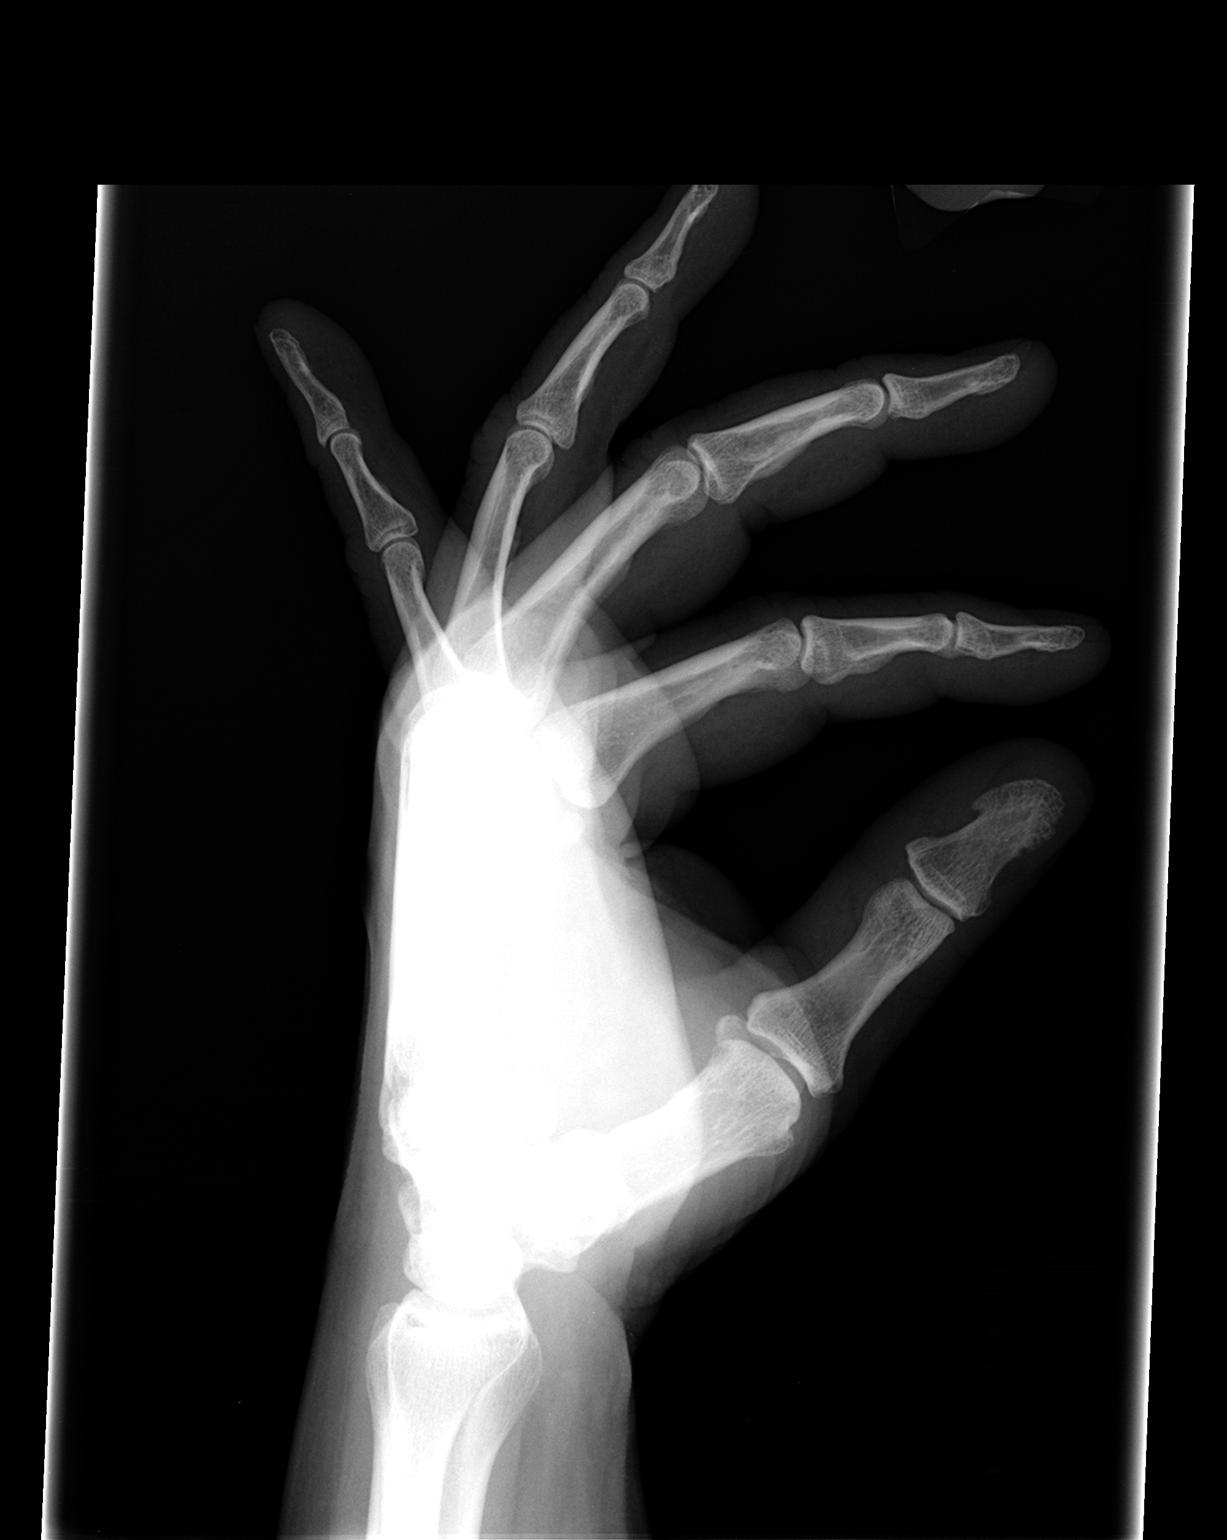

[3 of 3 positions shown; findings below may reference images not displayed]

FINDINGS: The joints of the left hand are aligned. No acute or healing
fracture is identified. No radiopaque foreign body. No soft tissue
gas. No discrete soft tissue laceration visualized radiographically.
IMPRESSION: No acute bony abnormality.

## 2014-04-25 MED ORDER — BACITRACIN-NEOMYCIN-POLYMYXIN 400-5-5000 EX OINT
TOPICAL_OINTMENT | CUTANEOUS | Status: AC
Start: 2014-04-25 — End: 2014-04-25
  Administered 2014-04-25: 20:00:00
  Filled 2014-04-25: qty 1

## 2014-04-25 MED ORDER — TETANUS-DIPHTH-ACELL PERTUSSIS 5-2.5-18.5 LF-MCG/0.5 IM SUSP
0.5000 mL | Freq: Once | INTRAMUSCULAR | Status: AC
Start: 2014-04-25 — End: 2014-04-25
  Administered 2014-04-25: 0.5 mL via INTRAMUSCULAR
  Filled 2014-04-25: qty 0.5

## 2014-04-25 MED ORDER — LIDOCAINE HCL (PF) 1 % IJ SOLN
INTRAMUSCULAR | Status: AC
  Administered 2014-04-25: 20:00:00
  Filled 2014-04-25: qty 5

## 2014-04-25 MED ORDER — BACITRACIN-NEOMYCIN-POLYMYXIN 400-5-5000 EX OINT
TOPICAL_OINTMENT | CUTANEOUS | Status: AC
  Filled 2014-04-25: qty 1

## 2014-04-25 NOTE — Discharge Instructions (Signed)

## 2014-04-25 NOTE — ED Provider Notes (Signed)
CSN: [id number]     Arrival date & time 04/25/14  1811 History  This chart was scribed for No att. providers found,  by Stacy Gardner, ED Scribe. The patient was seen in room APA15/APA15 and the patient's care was started at 6:43 PM.   First MD Initiated Contact with Patient 04/25/14 1842     Chief Complaint  Patient presents with  . Extremity Laceration     (Consider location/radiation/quality/duration/timing/severity/associated sxs/prior Treatment) The history is provided by the patient and medical records. No language interpreter was used.   HPI Comments: [name] [name] is a 48 y.o. 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 Pt mentions having pain to the affect sites. He is unsure of his tetanus shot.  The bleeding is controlled upon arrival to the ED today.   Past Medical History  Diagnosis Date  . Arthritis   . Depression    Past Surgical History  Procedure Laterality Date  . Foot surgery    . Knee surgery     No family history on file. History  Substance Use Topics  . Smoking status: Never Smoker   . Smokeless tobacco: Not on file  . Alcohol Use: No    Review of Systems  Skin: Positive for wound.  Hematological: Does not bruise/bleed easily.      Allergies  Review of patient's allergies indicates no known allergies.  Home Medications   Prior to Admission medications   Not on File   BP 114/86  Pulse 109  Temp(Src) 99.5 F (37.5 C) (Oral)  Resp 20  Ht 5\' 11"  (1.803 m)  Wt 279 lb (126.554 kg)  BMI 38.93 kg/m2  SpO2 97% Physical Exam  Nursing note and vitals reviewed. Constitutional: He is oriented to person, place, and time. He appears well-developed and well-nourished. No distress.  HENT:  Head: Normocephalic and atraumatic.  Mouth/Throat: Oropharynx is clear and moist.  Eyes: Conjunctivae  and EOM are normal. Pupils are equal, round, and reactive to light.  Neck: Normal range of motion.  No meningismus.  Cardiovascular: Normal rate, regular rhythm and normal heart sounds.   Pulmonary/Chest: Effort normal and breath sounds normal.  Abdominal: Soft. There is no tenderness. There is no rebound and no guarding.  Musculoskeletal: Normal range of motion. He exhibits no edema.  Able to extend fingers fully.  Flexion and extension of the MCP, DIP and PIP joints of digits 2 and 3 intact.  Neurological: He is alert and oriented to person, place, and time. No cranial nerve deficit. He exhibits normal muscle tone. Coordination normal.  No ataxia on finger to nose bilaterally. No pronator drift. 5/5 strength throughout. CN 2-12 intact. Negative Romberg. Equal grip strength. Sensation intact. Gait is normal.   Skin: Skin is warm.  Abrasion and skin tear to dorsal proximal phamx of second digit. 2 cm transverse laceration just pass third MCP joint. No tendons visible. Full extension achieved  Psychiatric: He has a normal mood and affect. His behavior is normal.    ED Course  LACERATION REPAIR Date/Time: 04/25/2014 7:25 PM Performed by: Ezequiel Essex Authorized by: Ezequiel Essex Consent: Verbal consent obtained. Risks and benefits: risks, benefits and alternatives were discussed Consent given by: patient Patient understanding: patient states understanding of the procedure being performed Patient consent: the patient's understanding of the procedure matches consent given Procedure consent: procedure consent  matches procedure scheduled Relevant documents: relevant documents present and verified Test results: test results available and properly labeled Site marked: the operative site was marked Imaging studies: imaging studies available Patient identity confirmed: verbally with patient and provided demographic data Time out: Immediately prior to procedure a "time out" was called to  verify the correct patient, procedure, equipment, support staff and site/side marked as required. Body area: upper extremity Location details: left hand Laceration length: 3 cm Tendon involvement: none Nerve involvement: none Vascular damage: no Anesthesia: local infiltration Local anesthetic: lidocaine 1% without epinephrine Anesthetic total: 4 ml Patient sedated: no Preparation: Patient was prepped and draped in the usual sterile fashion. Irrigation solution: saline Irrigation method: syringe Amount of cleaning: standard Debridement: none Degree of undermining: none Skin closure: 5-0 Prolene Number of sutures: 4 Technique: simple Approximation: close Approximation difficulty: complex Dressing: gauze packing and 4x4 sterile gauze Patient tolerance: Patient tolerated the procedure well with no immediate complications.   (including critical care time) DIAGNOSTIC STUDIES: Oxygen Saturation is 97% on RA, normal by my interpretation.    COORDINATION OF CARE:  6:46 PM Discussed course of care with pt which includes suture repair. Pt understands and agrees.   Labs Review Labs Reviewed - No data to display  Imaging Review Dg Hand Complete Left  04/25/2014   CLINICAL DATA:  Extremity laceration. Tripped and fell today. Laceration to index finger at MCP joint and middle finger head MCP joint and PIP joint.  EXAM: LEFT HAND - COMPLETE 3+ VIEW  COMPARISON:  None.  FINDINGS: The joints of the left hand are aligned. No acute or healing fracture is identified. No radiopaque foreign body. No soft tissue gas. No discrete soft tissue laceration visualized radiographically.  IMPRESSION: No acute bony abnormality.   Electronically Signed   By: Curlene Dolphin M.D.   On: 04/25/2014 19:13     EKG Interpretation None      MDM   Final diagnoses:  Hand laceration, left, initial encounter   Laceration to left MCP joint from piece of metal.  Extension intact, flexion intact at the MCP, PIP and  PIP joints. We'll update tetanus today. No evidence of tendon, nerve, or vessel involvement, Xray negative. Wound repaired as above. Follow up in 1 week for suture removal.      Ezequiel Essex, MD 04/26/14 0130

## 2014-04-25 NOTE — ED Notes (Signed)
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 Unable to recall last tetanus.

## 2014-11-23 ENCOUNTER — Encounter (HOSPITAL_COMMUNITY): Payer: Self-pay | Admitting: Emergency Medicine

## 2014-11-23 ENCOUNTER — Emergency Department (HOSPITAL_COMMUNITY)
Admission: EM | Admit: 2014-11-23 | Discharge: 2014-11-23 | Disposition: A | Discharge status: Home / Self Care | Payer: Medicare Other | Attending: Emergency Medicine | Admitting: Emergency Medicine

## 2014-11-23 DIAGNOSIS — Z8739 Personal history of other diseases of the musculoskeletal system and connective tissue: Secondary | ICD-10-CM | POA: Diagnosis not present

## 2014-11-23 DIAGNOSIS — Z8659 Personal history of other mental and behavioral disorders: Secondary | ICD-10-CM | POA: Diagnosis not present

## 2014-11-23 DIAGNOSIS — K029 Dental caries, unspecified: Secondary | ICD-10-CM | POA: Insufficient documentation

## 2014-11-23 DIAGNOSIS — K088 Other specified disorders of teeth and supporting structures: Secondary | ICD-10-CM | POA: Diagnosis present

## 2014-11-23 DIAGNOSIS — K047 Periapical abscess without sinus: Secondary | ICD-10-CM | POA: Diagnosis not present

## 2014-11-23 MED ORDER — CLINDAMYCIN HCL 300 MG PO CAPS: 300.0000 mg | ORAL_CAPSULE | Freq: Four times a day (QID) | ORAL | Status: DC

## 2014-11-23 MED ORDER — HYDROCODONE-ACETAMINOPHEN 5-325 MG PO TABS: ORAL_TABLET | ORAL | Status: DC

## 2014-11-23 NOTE — Discharge Instructions (Signed)

## 2014-11-23 NOTE — ED Provider Notes (Signed)
CSN: [id number]     Arrival date & time 11/23/14  1512 History   First MD Initiated Contact with Patient 11/23/14 1526     Chief Complaint  Patient presents with  . Dental Pain     (Consider location/radiation/quality/duration/timing/severity/associated sxs/prior Treatment) HPI   [name] [name] is a 49 y.o. male who presents to the Emergency Department complaining of dental pain for one week.  He states the symptoms began after accidentally biting down on a bone while eating.  He reports having pain to the left lower tooth that was controlled with naproxen, but states that he noticed facial swelling yesterday.  He contacted his dentist , but he was unable to arrange an appt.  He denies nausea, vomiting, neck pain, difficulty swallowing or fever.       Past Medical History  Diagnosis Date  . Arthritis   . Depression    Past Surgical History  Procedure Laterality Date  . Foot surgery    . Knee surgery     History reviewed. No pertinent family history. History  Substance Use Topics  . Smoking status: Never Smoker   . Smokeless tobacco: Not on file  . Alcohol Use: No    Review of Systems  Constitutional: Negative for fever and appetite change.  HENT: Positive for dental problem and facial swelling. Negative for congestion, sore throat and trouble swallowing.   Eyes: Negative for pain and visual disturbance.  Gastrointestinal: Negative for nausea and vomiting.  Musculoskeletal: Negative for neck pain and neck stiffness.  Neurological: Negative for dizziness, facial asymmetry and headaches.  Hematological: Negative for adenopathy.  All other systems reviewed and are negative.     Allergies  Review of patient's allergies indicates no known allergies.  Home Medications   Prior to Admission medications   Not on File   BP 163/102 mmHg  Pulse 99  Temp(Src) 98.2 F (36.8 C) (Oral)  Resp 18  Ht 5\' 11"  (1.803 m)  Wt 280 lb (127.007 kg)  BMI 39.07 kg/m2  SpO2  100% Physical Exam  Constitutional: He is oriented to person, place, and time. He appears well-developed and well-nourished. No distress.  HENT:  Head: Normocephalic and atraumatic.  Right Ear: Tympanic membrane and ear canal normal.  Left Ear: Tympanic membrane and ear canal normal.  Mouth/Throat: Uvula is midline, oropharynx is clear and moist and mucous membranes are normal. No trismus in the jaw. Dental caries present. No dental abscesses or uvula swelling.    Tenderness of the gums adjacent to the left lower first molar.  Mild localized facial swelling, No trismus, or sublingual abnml.    Neck: Normal range of motion. Neck supple.  Cardiovascular: Normal rate, regular rhythm and normal heart sounds.   No murmur heard. Pulmonary/Chest: Effort normal and breath sounds normal. No respiratory distress.  Musculoskeletal: Normal range of motion.  Lymphadenopathy:    He has no cervical adenopathy.  Neurological: He is alert and oriented to person, place, and time. He exhibits normal muscle tone. Coordination normal.  Skin: Skin is warm and dry.  Nursing note and vitals reviewed.   ED Course  Procedures (including critical care time) Labs Review Labs Reviewed - No data to display  Imaging Review No results found.   EKG Interpretation None      MDM   Final diagnoses:  Dental abscess    Vital signs are stable. he is nontoxic appearing. Likely dental abscess although nothing appears drainable at this time. No concerning symptoms for infection  to the floor the mouth or deep structures of the neck. Patient agrees to close follow-up with a dentist    Darris Staiger L. Ammie Ferrier 11/23/14 1557  Virgel Manifold, MD 11/24/14 1006

## 2014-11-23 NOTE — ED Notes (Signed)
Pt states that he has been having dental pain for about a week and bit down on a piece of bone by accident and exacerbated it.

## 2015-10-18 ENCOUNTER — Emergency Department (HOSPITAL_COMMUNITY): Payer: Medicare Other

## 2015-10-18 ENCOUNTER — Emergency Department (HOSPITAL_COMMUNITY)
Admission: EM | Admit: 2015-10-18 | Discharge: 2015-10-19 | Disposition: A | Discharge status: Home / Self Care | Payer: Medicare Other | Attending: Emergency Medicine | Admitting: Emergency Medicine

## 2015-10-18 ENCOUNTER — Encounter (HOSPITAL_COMMUNITY): Payer: Self-pay | Admitting: *Deleted

## 2015-10-18 DIAGNOSIS — K922 Gastrointestinal hemorrhage, unspecified: Secondary | ICD-10-CM | POA: Diagnosis not present

## 2015-10-18 DIAGNOSIS — Z87891 Personal history of nicotine dependence: Secondary | ICD-10-CM | POA: Insufficient documentation

## 2015-10-18 DIAGNOSIS — Z8739 Personal history of other diseases of the musculoskeletal system and connective tissue: Secondary | ICD-10-CM | POA: Insufficient documentation

## 2015-10-18 DIAGNOSIS — R1013 Epigastric pain: Secondary | ICD-10-CM | POA: Diagnosis present

## 2015-10-18 DIAGNOSIS — Z8659 Personal history of other mental and behavioral disorders: Secondary | ICD-10-CM | POA: Insufficient documentation

## 2015-10-18 HISTORY — DX: Umbilical hernia without obstruction or gangrene: K42.9

## 2015-10-18 LAB — URINALYSIS, ROUTINE W REFLEX MICROSCOPIC
Bilirubin Urine: NEGATIVE
Glucose, UA: NEGATIVE mg/dL
LEUKOCYTES UA: NEGATIVE
NITRITE: NEGATIVE
Specific Gravity, Urine: >1.03 — ABNORMAL HIGH (ref 1.005–1.030)
pH: 5.5 (ref 5.0–8.0)

## 2015-10-18 LAB — COMPREHENSIVE METABOLIC PANEL
ALBUMIN: 4 g/dL (ref 3.5–5.0)
ALT: 69 U/L — ABNORMAL HIGH (ref 17–63)
ANION GAP: 8 (ref 5–15)
AST: 67 U/L — ABNORMAL HIGH (ref 15–41)
Alkaline Phosphatase: 126 U/L (ref 38–126)
BILIRUBIN TOTAL: 1 mg/dL (ref 0.3–1.2)
BUN: 13 mg/dL (ref 6–20)
CO2: 25 mmol/L (ref 22–32)
Calcium: 8.9 mg/dL (ref 8.9–10.3)
Chloride: 104 mmol/L (ref 101–111)
Creatinine, Ser: 1 mg/dL (ref 0.61–1.24)
GLUCOSE: 150 mg/dL — AB (ref 65–99)
POTASSIUM: 3.6 mmol/L (ref 3.5–5.1)
Sodium: 137 mmol/L (ref 135–145)
TOTAL PROTEIN: 7.5 g/dL (ref 6.5–8.1)

## 2015-10-18 LAB — CBC WITH DIFFERENTIAL/PLATELET
BASOS PCT: 0 %
Basophils Absolute: 0 10*3/uL (ref 0.0–0.1)
Eosinophils Absolute: 0 10*3/uL (ref 0.0–0.7)
Eosinophils Relative: 0 %
HEMATOCRIT: 45.2 % (ref 39.0–52.0)
Hemoglobin: 15.9 g/dL (ref 13.0–17.0)
Lymphocytes Relative: 20 %
Lymphs Abs: 1.8 10*3/uL (ref 0.7–4.0)
MCH: 31.9 pg (ref 26.0–34.0)
MCHC: 35.2 g/dL (ref 30.0–36.0)
MCV: 90.8 fL (ref 78.0–100.0)
MONO ABS: 0.7 10*3/uL (ref 0.1–1.0)
MONOS PCT: 8 %
NEUTROS ABS: 6.5 10*3/uL (ref 1.7–7.7)
Neutrophils Relative %: 72 %
Platelets: 130 10*3/uL — ABNORMAL LOW (ref 150–400)
RBC: 4.98 MIL/uL (ref 4.22–5.81)
RDW: 12.4 % (ref 11.5–15.5)
WBC: 9.1 10*3/uL (ref 4.0–10.5)

## 2015-10-18 LAB — URINE MICROSCOPIC-ADD ON

## 2015-10-18 LAB — AMYLASE: Amylase: 37 U/L (ref 28–100)

## 2015-10-18 LAB — POC OCCULT BLOOD, ED: FECAL OCCULT BLD: POSITIVE — AB

## 2015-10-18 IMAGING — CT CT ABD-PELV W/ CM
2 of 5 series · 16 of 46 positions shown, 18 images · IV contrast (Omnipaque 300)
Comparison: None.

CLINICAL DATA: Acute onset of epigastric and right flank pain.
Initial encounter.

EXAM:
CT ABDOMEN AND PELVIS WITH CONTRAST
TECHNIQUE: Multidetector CT imaging of the abdomen and pelvis was performed
using the standard protocol following bolus administration of
intravenous contrast.
CONTRAST:  100mL OMNIPAQUE IOHEXOL 300 MG/ML  SOLN

[Series 2: abd_pel_with 5.0 b40f · axial · 0.89mm/px · z∈[-532,-92]mm · 13 of 100 slices shown, 15 images]
[im 6/100  soft-tissue]
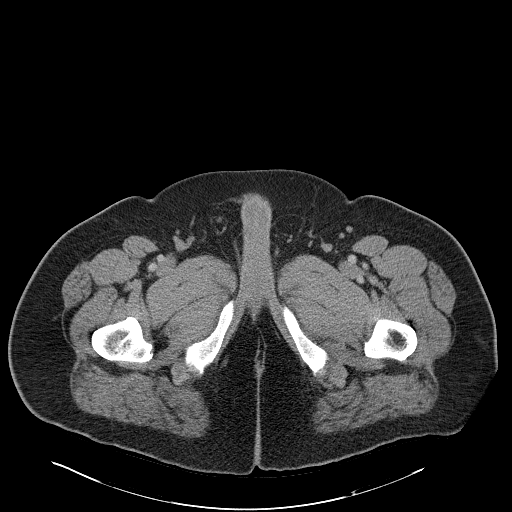
[im 6/100  bone]
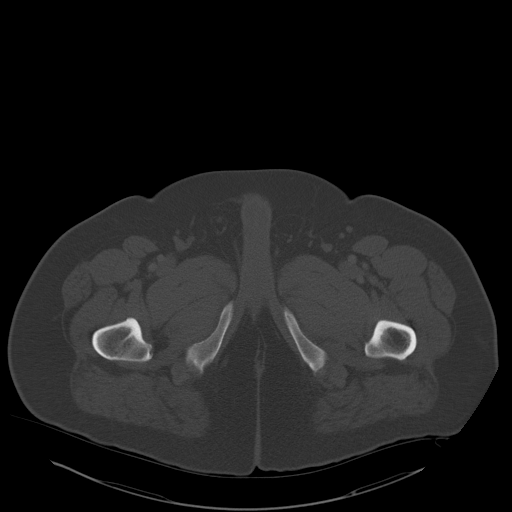
[im 16/100  soft-tissue]
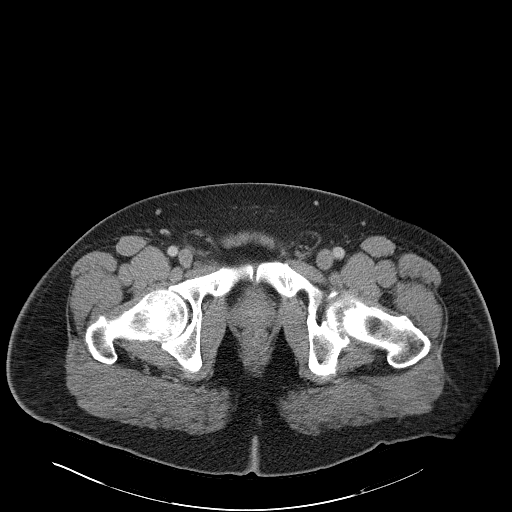
[im 21/100  soft-tissue]
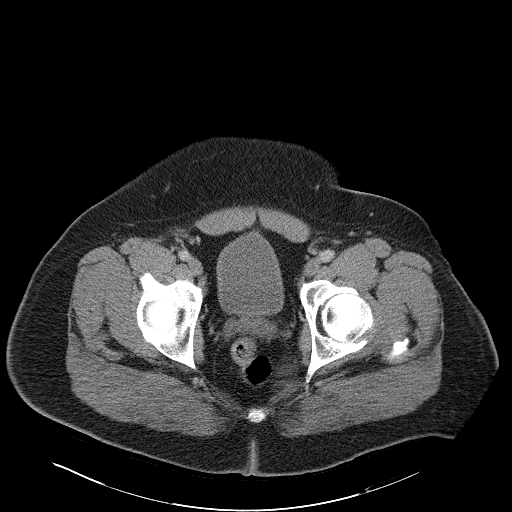
[im 27/100  soft-tissue]
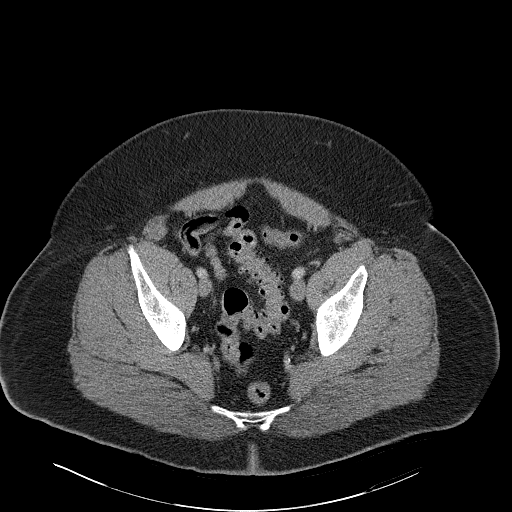
[im 37/100  soft-tissue]
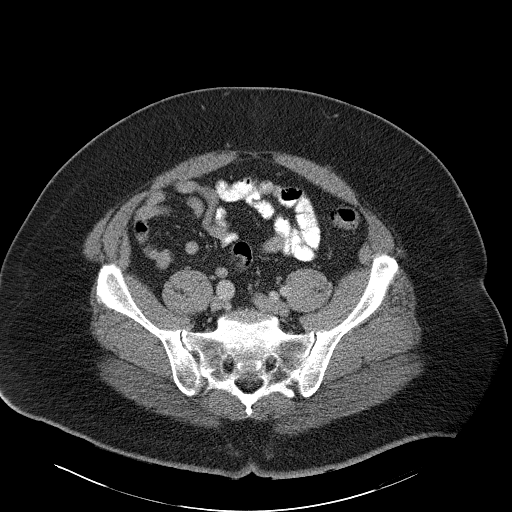
[im 42/100  soft-tissue]
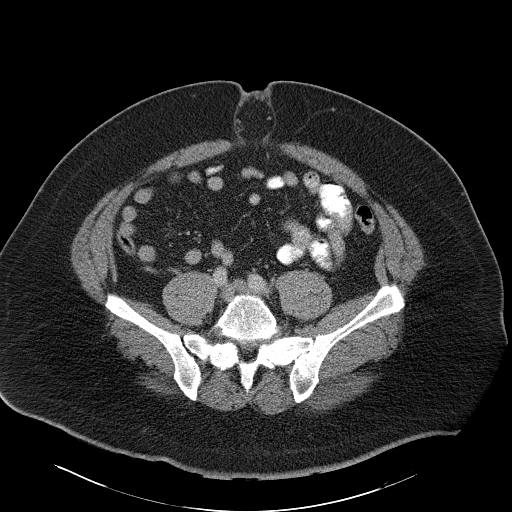
[im 53/100  soft-tissue]
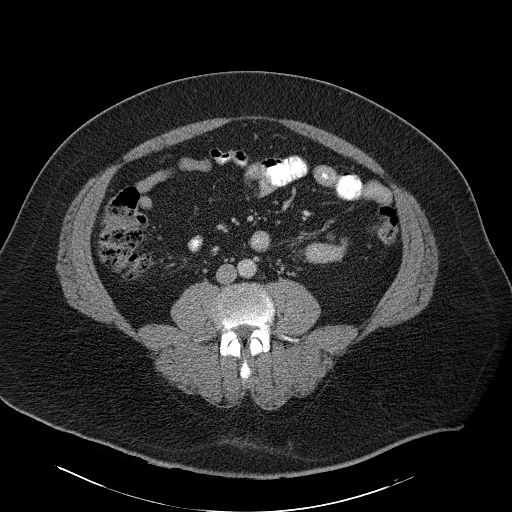
[im 58/100  soft-tissue]
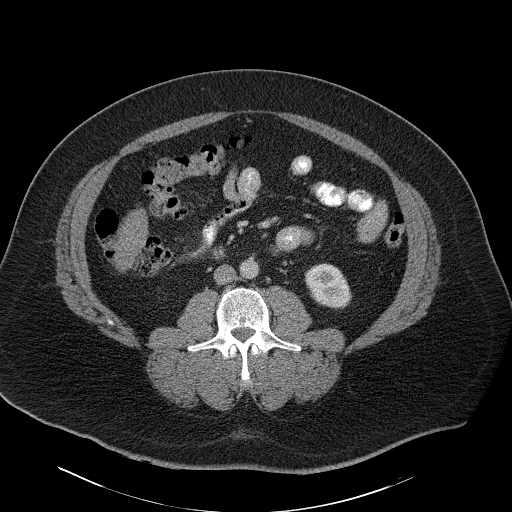
[im 63/100  soft-tissue]
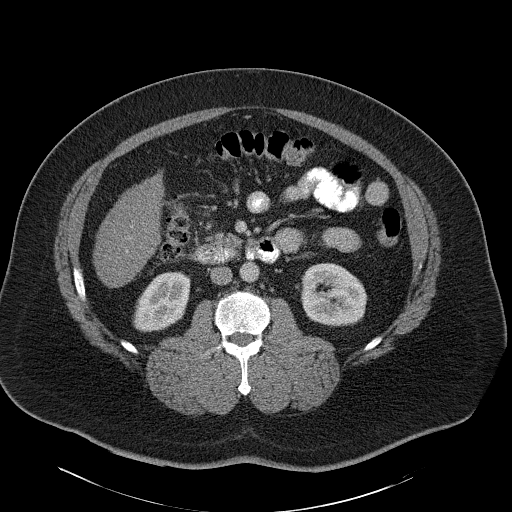
[im 63/100  bone]
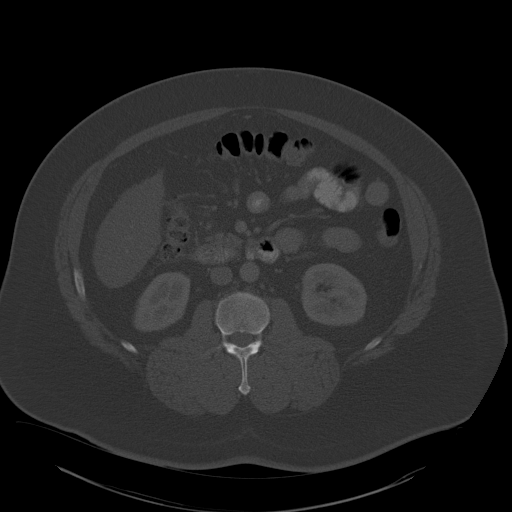
[im 73/100  soft-tissue]
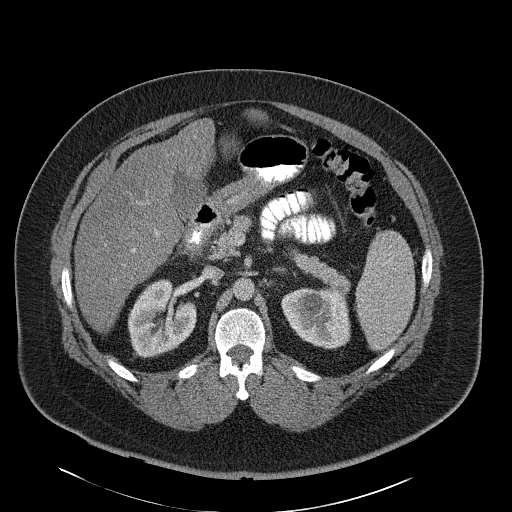
[im 79/100  soft-tissue]
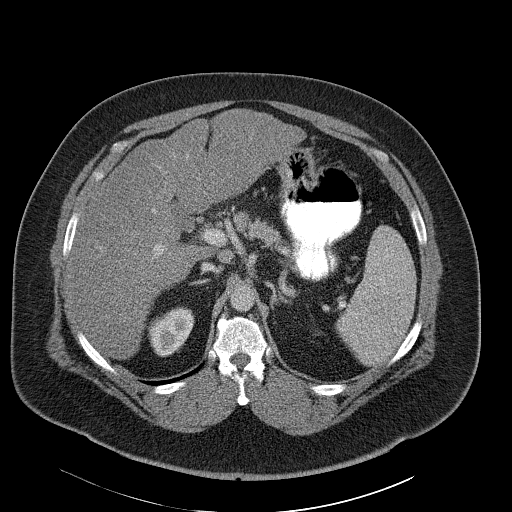
[im 84/100  soft-tissue]
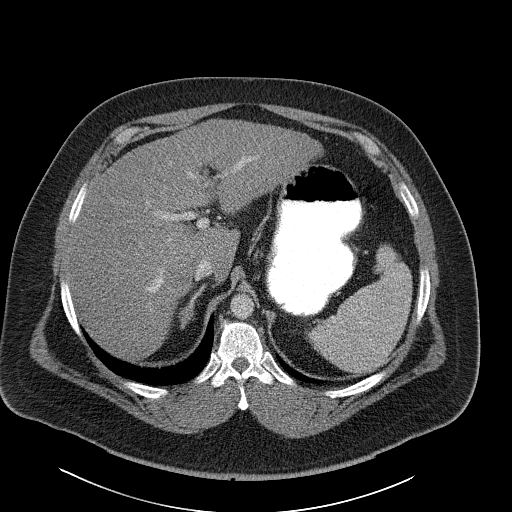
[im 94/100  soft-tissue]
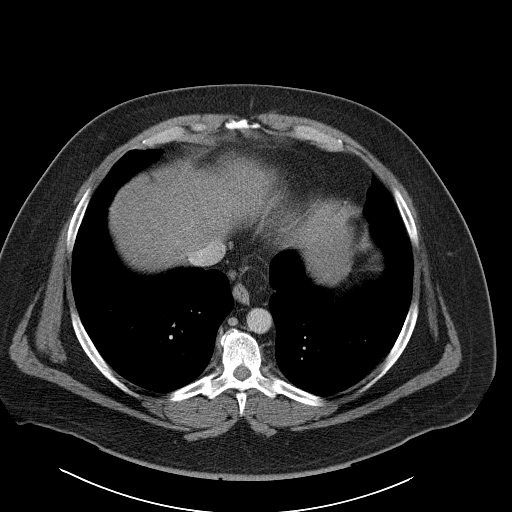

[Series 3: abd_pel_with 3.0 spo cor · coronal · 0.90mm/px · 3 of 110 slices shown]
[im 37/110  soft-tissue]
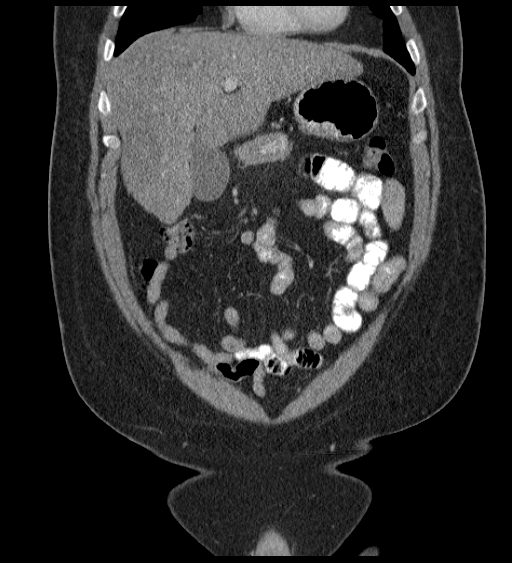
[im 49/110  soft-tissue]
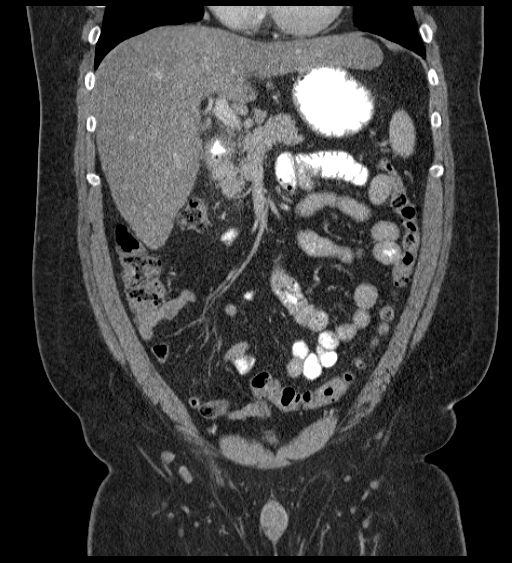
[im 61/110  soft-tissue]
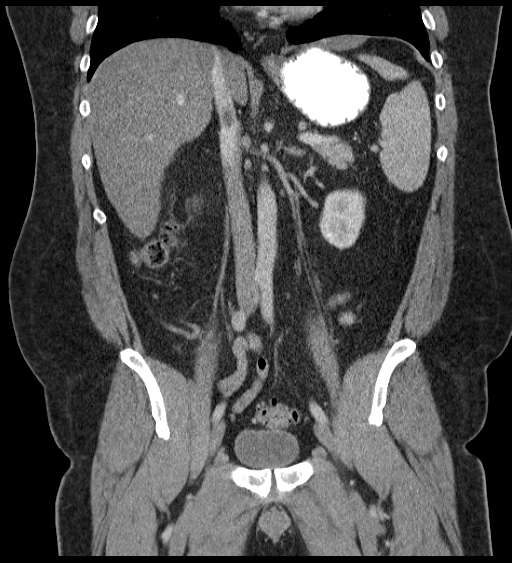

[16 of 46 positions shown; findings below may reference images not displayed]

FINDINGS: The visualized lung bases are clear.

There is a slightly nodular contour to the liver, raising concern
for mild changes of hepatic cirrhosis. The spleen is enlarged,
measuring 15.4 cm in length. The gallbladder is within normal
limits. The pancreas and left adrenal gland are unremarkable. A
small 1.1 cm right adrenal adenoma is suggested.

A 2.2 cm cyst is noted at the upper pole of the left kidney. The
kidneys are otherwise unremarkable. There is no evidence of
hydronephrosis. No renal or ureteral stones are seen. No perinephric
stranding is appreciated.

No free fluid is identified. The small bowel is unremarkable in
appearance. The stomach is within normal limits. No acute vascular
abnormalities are seen.

A small umbilical hernia is noted, containing only fat.

The appendix is normal in caliber, without evidence of appendicitis.
Scattered diverticulosis is noted along the proximal sigmoid colon,
without evidence of diverticulitis.

The bladder is mildly distended and grossly unremarkable. The
prostate remains normal in size. No inguinal lymphadenopathy is
seen.

No acute osseous abnormalities are identified.
IMPRESSION: 1. No acute abnormality seen to explain the patient's symptoms.
2. Slightly nodular contour to the liver, concerning for mild
changes of hepatic cirrhosis. Given mildly elevated LFTs, would
correlate clinically for underlying infectious or other causes of
cirrhosis.
3. Associated splenomegaly noted.
4. Small left renal cyst noted.
5. Small right adrenal adenoma suggested.
6. Small umbilical hernia, containing only fat.
7. Scattered diverticulosis along the proximal sigmoid colon,
without evidence of diverticulitis.

## 2015-10-18 MED ORDER — SODIUM CHLORIDE 0.9 % IV SOLN: 1000.0000 mL | INTRAVENOUS | Status: DC

## 2015-10-18 MED ORDER — IOHEXOL 300 MG/ML  SOLN
100.0000 mL | Freq: Once | INTRAMUSCULAR | Status: AC | PRN
  Administered 2015-10-19: 100 mL via INTRAVENOUS

## 2015-10-18 MED ORDER — SODIUM CHLORIDE 0.9 % IV SOLN
1000.0000 mL | Freq: Once | INTRAVENOUS | Status: AC
  Administered 2015-10-18: 1000 mL via INTRAVENOUS

## 2015-10-18 MED ORDER — PROCHLORPERAZINE EDISYLATE 5 MG/ML IJ SOLN
5.0000 mg | Freq: Once | INTRAMUSCULAR | Status: AC
  Administered 2015-10-18: 5 mg via INTRAVENOUS
  Filled 2015-10-18: qty 2

## 2015-10-18 MED ORDER — MORPHINE SULFATE (PF) 4 MG/ML IV SOLN
4.0000 mg | Freq: Once | INTRAVENOUS | Status: AC
  Administered 2015-10-18: 4 mg via INTRAVENOUS
  Filled 2015-10-18: qty 1

## 2015-10-18 NOTE — ED Notes (Signed)
Pt c/o lower abdominal pain that radiates to the right side of his abomen x 2 days; pt states he feels nauseous, but has not vomited; pt's last BM was today

## 2015-10-18 NOTE — ED Provider Notes (Signed)
CSN: [id number]     Arrival date & time 10/18/15  1909 History   None    Chief Complaint  Patient presents with  . Abdominal Pain     (Consider location/radiation/quality/duration/timing/severity/associated sxs/prior Treatment) Patient is a 50 y.o. male presenting with abdominal pain. The history is provided by the patient.  Abdominal Pain Pain location:  R flank and epigastric Pain quality: sharp   Duration:  5 days Timing:  Intermittent Progression:  Worsening Chronicity:  New Context: not alcohol use, not diet changes, not recent travel, not suspicious food intake and not trauma   Relieved by:  Nothing Worsened by:  Position changes Associated symptoms: nausea   Associated symptoms: no belching, no constipation, no fever and no flatus   Risk factors: NSAID use   Risk factors: no alcohol abuse     Past Medical History  Diagnosis Date  . Arthritis   . Depression   . Umbilical hernia    Past Surgical History  Procedure Laterality Date  . Foot surgery    . Knee surgery    . Hand surgery Right    History reviewed. No pertinent family history. Social History  Substance Use Topics  . Smoking status: Former Smoker    Types: Cigarettes  . Smokeless tobacco: None  . Alcohol Use: No    Review of Systems  Constitutional: Negative for fever.  Gastrointestinal: Positive for nausea and abdominal pain. Negative for constipation and flatus.  Musculoskeletal: Positive for arthralgias.  All other systems reviewed and are negative.     Allergies  Review of patient's allergies indicates no known allergies.  Home Medications   Prior to Admission medications   Medication Sig Start Date End Date Taking? Authorizing Provider  amoxicillin (AMOXIL) 500 MG capsule Take 1,500 mg by mouth 2 (two) times daily. 10/18/15  Yes Historical Provider, MD  omeprazole (PRILOSEC) 40 MG capsule Take 40 mg by mouth daily.  10/18/15  Yes Historical Provider, MD   BP 132/90 mmHg  Pulse 86   Temp(Src) 99.3 F (37.4 C) (Oral)  Resp 16  Ht 5\' 11"  (1.803 m)  Wt 130.182 kg  BMI 40.05 kg/m2  SpO2 97% Physical Exam  Constitutional: He is oriented to person, place, and time. He appears well-developed and well-nourished.  Non-toxic appearance.  HENT:  Head: Normocephalic.  Right Ear: Tympanic membrane and external ear normal.  Left Ear: Tympanic membrane and external ear normal.  Eyes: EOM and lids are normal. Pupils are equal, round, and reactive to light.  Neck: Normal range of motion. Neck supple. Carotid bruit is not present.  Cardiovascular: Normal rate, regular rhythm, normal heart sounds, intact distal pulses and normal pulses.   Pulmonary/Chest: Breath sounds normal. No respiratory distress.  Abdominal: Soft. Bowel sounds are normal. He exhibits no distension. There is no tenderness. There is no guarding.  Genitourinary: Prostate normal. Guaiac positive stool.  Musculoskeletal: Normal range of motion.  Lymphadenopathy:       Head (right side): No submandibular adenopathy present.       Head (left side): No submandibular adenopathy present.    He has no cervical adenopathy.  Neurological: He is alert and oriented to person, place, and time. He has normal strength. No cranial nerve deficit or sensory deficit.  Skin: Skin is warm and dry.  Psychiatric: He has a normal mood and affect. His speech is normal.  Nursing note and vitals reviewed.   ED Course  Procedures (including critical care time) Labs Review Labs Reviewed  CBC  WITH DIFFERENTIAL/PLATELET - Abnormal; Notable for the following:    Platelets 130 (*)    All other components within normal limits  COMPREHENSIVE METABOLIC PANEL - Abnormal; Notable for the following:    Glucose, Bld 150 (*)    AST 67 (*)    ALT 69 (*)    All other components within normal limits  URINALYSIS, ROUTINE W REFLEX MICROSCOPIC (NOT AT [facility]) - Abnormal; Notable for the following:    Specific Gravity, Urine >1.030 (*)    Hgb urine  dipstick TRACE (*)    Ketones, ur TRACE (*)    Protein, ur TRACE (*)    All other components within normal limits  URINE MICROSCOPIC-ADD ON - Abnormal; Notable for the following:    Squamous Epithelial / LPF 0-5 (*)    Bacteria, UA FEW (*)    All other components within normal limits  AMYLASE    Imaging Review No results found. I have personally reviewed and evaluated these images and lab results as part of my medical decision-making.   EKG Interpretation None      MDM  Vital signs are nonacute. Shortly after completing the examination, the patient began to have cramping sensation in the epigastric area as well as the right flank area.  Patient was treated with IV fluids and IV Compazine and morphine. The discomfort was significantly improved.  Stool for occult blood was found to be positive. Complete blood count was well within normal limits. Components of metabolic panel showed the AST and ALT mildly elevated. Otherwise within normal limits. Urine analysis shows a clear yellow specimen with a specific gravity of greater than 1.030 with a trace of protein, and a trace of ketones present. CT scan of the abdomen and pelvis with contrast shows no acute abnormality. There was a slight nodular contour of the liver possibly consistent with mild hepatic cirrhosis. There is also is associated splenomegaly present. And there is some scattered diverticulosis of the proximal sigmoid colon without evidence of diverticulitis.  I discussed the findings with the patient and the patient's wife in terms which they understand. The patient is currently on Prilosec and high-dose Amoxil. The patient will have prescription for Carafate added to that, and he is referred to Dr. Oneida Alar for GI evaluation. The patient and his wife acknowledge understanding of these instructions, and are in agreement.    Final diagnoses:  None    **I have reviewed nursing notes, vital signs, and all appropriate lab and  imaging results for this patient.Lily Kocher, PA-C 10/19/15 [id number]  Merryl Hacker, MD 10/20/15 [id number]

## 2015-10-19 ENCOUNTER — Encounter: Payer: Self-pay | Admitting: Gastroenterology

## 2015-10-19 ENCOUNTER — Other Ambulatory Visit: Payer: Self-pay

## 2015-10-19 ENCOUNTER — Other Ambulatory Visit: Payer: Self-pay | Admitting: Gastroenterology

## 2015-10-19 ENCOUNTER — Ambulatory Visit (INDEPENDENT_AMBULATORY_CARE_PROVIDER_SITE_OTHER): Payer: Medicare Other | Admitting: Gastroenterology

## 2015-10-19 VITALS — BP 149/87 | HR 82 | Temp 97.1°F | Ht 71.0 in | Wt 288.4 lb

## 2015-10-19 DIAGNOSIS — R1013 Epigastric pain: Secondary | ICD-10-CM

## 2015-10-19 DIAGNOSIS — K7469 Other cirrhosis of liver: Secondary | ICD-10-CM

## 2015-10-19 DIAGNOSIS — K7460 Unspecified cirrhosis of liver: Secondary | ICD-10-CM | POA: Insufficient documentation

## 2015-10-19 DIAGNOSIS — D696 Thrombocytopenia, unspecified: Secondary | ICD-10-CM

## 2015-10-19 DIAGNOSIS — K7581 Nonalcoholic steatohepatitis (NASH): Secondary | ICD-10-CM

## 2015-10-19 MED ORDER — SUCRALFATE 1 G PO TABS: 1.0000 g | ORAL_TABLET | Freq: Three times a day (TID) | ORAL | Status: DC

## 2015-10-19 MED ORDER — HYDROCODONE-ACETAMINOPHEN 5-325 MG PO TABS: 1.0000 | ORAL_TABLET | ORAL | Status: DC | PRN

## 2015-10-19 MED ORDER — SUCRALFATE 1 G PO TABS
1.0000 g | ORAL_TABLET | Freq: Once | ORAL | Status: AC
  Administered 2015-10-19: 1 g via ORAL
  Filled 2015-10-19: qty 1

## 2015-10-19 NOTE — Progress Notes (Signed)
   Subjective:    Patient ID: [name] [name], male    DOB: October 27, 1965, 50 y.o.   MRN: [id number]  Marval Regal, MD  HPI Was having trouble with tooth infection.SYMPTOMS FOR 3 DAYS SHARP UPPER ABDOMINAL PAIN. TAKING AMOXICILLIN x1 DOSE STARTED TAKING IBUPROFEN OTC 4 ON SCHEDULE AND WOULD TAKE 2 MORE. THEN TAKING ACETAMINOPHEN. NO ASPIRIN, BC/GOODY POWDERS, OR NAPROXEN/ALEVE. NO ETOH. UPPER ABDOMINAL PAIN ALWAYS AND RIGHT BELOW RIB CAGE. BETTER: W/ CARAFATE. APPETITE: NAUSEATED BEFORE BUT BETTER. . SAW A DENTIST. DARK ONE TIME. HAS AHD SOME BLEEDING IN YOUR MOUTH AND FROM TOOTH AREA. BMs: 2-3 X/DAY-NL. PAST 15-20 YEARS 225-280 LBS. NO TRANSFUSION OR RECREATIONAL DRUGS.  PT DENIES FEVER, CHILLS, HEMATOCHEZIA, HEMATEMESIS, nausea, vomiting, melena, diarrhea, CHEST PAIN, SHORTNESS OF BREATH, CHANGE IN BOWEL IN HABITS, constipation, problems swallowing, problems with sedation, OR heartburn or indigestion.  Past Medical History  Diagnosis Date  . Arthritis   . Depression   . Umbilical hernia    Past Surgical History  Procedure Laterality Date  . Foot surgery    . Knee surgery    . Hand surgery Right    No Known Allergies  Current Outpatient Prescriptions  Medication Sig Dispense Refill  . amoxicillin (AMOXIL) 500 MG capsule Take 1,500 mg by mouth 2 (two) times daily.    Marland Kitchen HYDROcodone-acetaminophen (NORCO/VICODIN) 5-325 MG tablet Take 1-2 tablets by mouth every 4 (four) hours as needed for moderate pain.    Marland Kitchen omeprazole (PRILOSEC) 40 MG capsule Take 40 mg by mouth daily.     . sucralfate (CARAFATE) 1 g tablet Take 1 tablet (1 g total) by mouth 4 (four) times daily -  with meals and at bedtime.     Family History  Problem Relation Age of Onset  . Colon cancer Neg Hx   . Colon polyps Neg Hx   . Stomach cancer Neg Hx   . Pancreatic cancer Neg Hx    Social History   Social History  . Marital Status: Married    Spouse Name: N/A  . Number of Children: N/A  . Years of Education: N/A    Social History Main Topics  . Smoking status: Former Smoker    Types: Cigarettes  . Smokeless tobacco: None  . Alcohol Use: No  . Drug Use: No  . 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 HAS 5 KIDS. MARRIED FOR 10 YEARS.  Review of Systems PER HPI OTHERWISE ALL SYSTEMS ARE NEGATIVE.    Objective:   Physical Exam  Constitutional: He is oriented to person, place, and time. He appears well-developed and well-nourished. No distress.  HENT:  Head: Normocephalic and atraumatic.  Mouth/Throat: Oropharynx is clear and moist. No oropharyngeal exudate.  Eyes: Pupils are equal, round, and reactive to light. No scleral icterus.  Neck: Normal range of motion. Neck supple.  Cardiovascular: Normal rate, regular rhythm and normal heart sounds.   Pulmonary/Chest: Effort normal and breath sounds normal. No respiratory distress.  Abdominal: Soft. Bowel sounds are normal. He exhibits no distension. There is no tenderness.  Musculoskeletal: He exhibits no edema.  R LEG SMALLER THAN LEFT  Lymphadenopathy:    He has no cervical adenopathy.  Neurological: He is alert and oriented to person, place, and time.  NO FOCAL DEFICITS  Psychiatric: He has a normal mood and affect.  Vitals reviewed.     Assessment & Plan:

## 2015-10-19 NOTE — Assessment & Plan Note (Addendum)
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   CONTINUE YOUR WEIGHT LOSS EFFORTS. YOU SHOULD LOSE 50 LBS. DRINK WATER TO KEEP YOUR URINE LIGHT YELLOW. CHECKED FOR VIRAL HEPATITIS.  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 FOLLOW UP IN 6 MOS.

## 2015-10-19 NOTE — Discharge Instructions (Signed)
Your vital signs are nonacute. Your comprehensive metabolic panel reveals a few changes in your liver enzymes, but otherwise within normal limits. The CT scan reveals some mild changes of your liver, and mild to moderate enlargement of your spleen, but no other problems noted. Your stool is positive for occult (hidden) blood. Please continue your  Prilosec and Amoxil. Please add Carafate 3 times dailywith meals. Please refrain from all aspirin, ibuprofen, Aleve, or related medications. Call Dr. Oneida Alar, and schedule an appointment as soon as possible concerning your bleeding and stomach cramping. Use Tylenol for mild pain, use Norco for more severe pain. Norco may cause drowsiness, please use this medication with caution.  Gastrointestinal Bleeding Gastrointestinal bleeding is bleeding somewhere along the path that food travels through the body (digestive tract). This path is anywhere between the mouth and the opening of the butt (anus). You may have blood in your throw up (vomit) or in your poop (stools). If there is a lot of bleeding, you may need to stay in the hospital. [place]  Only take medicine as told by your doctor.  Eat foods with fiber such as whole grains, fruits, and vegetables. You can also try eating 1 to 3 prunes a day.  Drink enough fluids to keep your pee (urine) clear or pale yellow. GET HELP RIGHT AWAY IF:   Your bleeding gets worse.  You feel dizzy, weak, or you pass out (faint).  You have bad cramps in your back or belly (abdomen).  You have large blood clumps (clots) in your poop.  Your problems are getting worse. MAKE SURE YOU:   Understand these instructions.  Will watch your condition.  Will get help right away if you are not doing well or get worse.   This information is not intended to replace advice given to you by your health care provider. Make sure you discuss any questions you have with your health care provider.   Document Released: 06/25/2008  Document Revised: 09/02/2012 Document Reviewed: 03/06/2015 Elsevier Interactive Patient Education [employer].

## 2015-10-19 NOTE — Assessment & Plan Note (Signed)
  CONTINUE YOUR WEIGHT LOSS EFFORTS. YOU SHOULD LOSE 50 LBS. DRINK WATER TO KEEP YOUR URINE LIGHT YELLOW. CHECKED FOR VIRAL HEPATITIS.  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 FOLLOW UP IN 6 MOS.

## 2015-10-19 NOTE — Assessment & Plan Note (Signed)
MOST LIKELY DUE TO NSAID GASTRITIS,LESS LIKELY PUD OR GASTRIC CA.    CONTINUE YOUR WEIGHT LOSS EFFORTS. YOU SHOULD LOSE 50 LBS. DRINK WATER TO KEEP YOUR URINE LIGHT YELLOW. FOLLOW A HIGH FIBER/LOW FAT DIET. SEE INFO BELOW. Continue PRILOSEC 30 MINS BEFORE YOUR FIRST MEAL. USE SUCRALFATE AS NEEDED FOR UPPER ABDOMINAL PAIN. YOU SHOULD BE CHECKED FOR VIRAL HEPATITIS. COMPLETE YOUR LAB DRAW. 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 FOLLOW UP IN 6 MOS.

## 2015-10-19 NOTE — Patient Instructions (Signed)
YOUR ENLARGED SPLEEN IS MOST LIKELY DUE TO YOUR BMI > 40 AND POSSIBLE MILD CIRRHOSIS FROM NONALCOHOLIC STEATO-HEPATITIS. YOUR LIVER ENZYMES ARE SLIGHTLY ELEVATED MOST LIKELY DUE TO NON-ALCOHOLIC STEATO-HEPATITIS.   CONTINUE YOUR WEIGHT LOSS EFFORTS. YOU SHOULD LOSE 50 LBS.  DRINK WATER TO KEEP YOUR URINE LIGHT YELLOW.  FOLLOW A HIGH FIBER/LOW FAT DIET. SEE INFO BELOW.  Continue PRILOSEC 30 MINS BEFORE YOUR FIRST MEAL.  USE SUCRALFATE AS NEEDED FOR UPPER ABDOMINAL PAIN.  YOU SHOULD BE CHECKED FOR VIRAL HEPATITIS. COMPLETE YOUR LAB DRAW.  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  FOLLOW UP IN 6 MOS.   High-Fiber Diet A high-fiber diet changes your normal diet to include more whole grains, legumes, fruits, and vegetables. Changes in the diet involve replacing refined carbohydrates with unrefined foods. The calorie level of the diet is essentially unchanged. The Dietary Reference Intake (recommended amount) for adult males is 38 grams per day. For adult females, it is 25 grams per day. Pregnant and lactating women should consume 28 grams of fiber per day. Fiber is the intact part of a plant that is not broken down during digestion. Functional fiber is fiber that has been isolated from the plant to provide a beneficial effect in the body. PURPOSE  Increase stool bulk.   Ease and regulate bowel movements.   Lower cholesterol.  REDUCE RISK OF COLON CANCER  INDICATIONS THAT YOU NEED MORE FIBER  Constipation and hemorrhoids.   Uncomplicated diverticulosis (intestine condition) and irritable bowel syndrome.   Weight management.   As a protective measure against hardening of the arteries (atherosclerosis), diabetes, and cancer.   GUIDELINES FOR INCREASING FIBER IN THE DIET  Start adding fiber to the diet slowly. A gradual increase of about 5 more grams (2 slices of whole-wheat bread, 2 servings of most fruits  or vegetables, or 1 bowl of high-fiber cereal) per day is best. Too rapid an increase in fiber may result in constipation, flatulence, and bloating.   Drink enough water and fluids to keep your urine clear or pale yellow. Water, juice, or caffeine-free drinks are recommended. Not drinking enough fluid may cause constipation.   Eat a variety of high-fiber foods rather than one type of fiber.   Try to increase your intake of fiber through using high-fiber foods rather than fiber pills or supplements that contain small amounts of fiber.   The goal is to change the types of food eaten. Do not supplement your present diet with high-fiber foods, but replace foods in your present diet.   INCLUDE A VARIETY OF FIBER SOURCES  Replace refined and processed grains with whole grains, canned fruits with fresh fruits, and incorporate other fiber sources. White rice, white breads, and most bakery goods contain little or no fiber.   Brown whole-grain rice, buckwheat oats, and many fruits and vegetables are all good sources of fiber. These include: broccoli, Brussels sprouts, cabbage, cauliflower, beets, sweet potatoes, white potatoes (skin on), carrots, tomatoes, eggplant, squash, berries, fresh fruits, and dried fruits.   Cereals appear to be the richest source of fiber. Cereal fiber is found in whole grains and bran. Bran is the fiber-rich outer coat of cereal grain, which is largely removed in refining. In whole-grain cereals, the bran remains. In breakfast cereals, the largest amount of fiber is found in those with "bran" in their names. The fiber content is sometimes indicated on the label.   You may need to  include additional fruits and vegetables each day.   In baking, for 1 cup white flour, you may use the following substitutions:   1 cup whole-wheat flour minus 2 tablespoons.   1/2 cup white flour plus 1/2 cup whole-wheat flour.   Low-Fat Diet BREADS, CEREALS, PASTA, RICE, DRIED PEAS, AND  BEANS These products are high in carbohydrates and most are low in fat. Therefore, they can be increased in the diet as substitutes for fatty foods. They too, however, contain calories and should not be eaten in excess. Cereals can be eaten for snacks as well as for breakfast.   FRUITS AND VEGETABLES It is good to eat fruits and vegetables. Besides being sources of fiber, both are rich in vitamins and some minerals. They help you get the daily allowances of these nutrients. Fruits and vegetables can be used for snacks and desserts.  MEATS Limit lean meat, chicken, [place], and fish to no more than 6 ounces per day. Beef, Pork, and Lamb Use lean cuts of beef, pork, and lamb. Lean cuts include:  Extra-lean ground beef.  Arm roast.  Sirloin tip.  Center-cut ham.  Round steak.  Loin chops.  Rump roast.  Tenderloin.  Trim all fat off the outside of meats before cooking. It is not necessary to severely decrease the intake of red meat, but lean choices should be made. Lean meat is rich in protein and contains a highly absorbable form of iron. Premenopausal women, in particular, should avoid reducing lean red meat because this could increase the risk for low red blood cells (iron-deficiency anemia).  Chicken and [place] These are good sources of protein. The fat of poultry can be reduced by removing the skin and underlying fat layers before cooking. Chicken and [place] can be substituted for lean red meat in the diet. Poultry should not be fried or covered with high-fat sauces. Fish and Shellfish Fish is a good source of protein. Shellfish contain cholesterol, but they usually are low in saturated fatty acids. The preparation of fish is important. Like chicken and [place], they should not be fried or covered with high-fat sauces. EGGS Egg whites contain no fat or cholesterol. They can be eaten often. Try 1 to 2 egg whites instead of whole eggs in recipes or use egg substitutes that do not contain  yolk. MILK AND DAIRY PRODUCTS Use skim or 1% milk instead of 2% or whole milk. Decrease whole milk, natural, and processed cheeses. Use nonfat or low-fat (2%) cottage cheese or low-fat cheeses made from vegetable oils. Choose nonfat or low-fat (1 to 2%) yogurt. Experiment with evaporated skim milk in recipes that call for heavy cream. Substitute low-fat yogurt or low-fat cottage cheese for sour cream in dips and salad dressings. Have at least 2 servings of low-fat dairy products, such as 2 glasses of skim (or 1%) milk each day to help get your daily calcium intake. FATS AND OILS Reduce the total intake of fats, especially saturated fat. Butterfat, lard, and beef fats are high in saturated fat and cholesterol. These should be avoided as much as possible. Vegetable fats do not contain cholesterol, but certain vegetable fats, such as coconut oil, palm oil, and palm kernel oil are very high in saturated fats. These should be limited. These fats are often used in bakery goods, processed foods, popcorn, oils, and nondairy creamers. Vegetable shortenings and some peanut butters contain hydrogenated oils, which are also saturated fats. Read the labels on these foods and check for saturated vegetable oils. Unsaturated  vegetable oils and fats do not raise blood cholesterol. However, they should be limited because they are fats and are high in calories. Total fat should still be limited to 30% of your daily caloric intake. Desirable liquid vegetable oils are corn oil, cottonseed oil, olive oil, canola oil, safflower oil, soybean oil, and sunflower oil. Peanut oil is not as good, but small amounts are acceptable. Buy a heart-healthy tub margarine that has no partially hydrogenated oils in the ingredients. Mayonnaise and salad dressings often are made from unsaturated fats, but they should also be limited because of their high calorie and fat content. Seeds, nuts, peanut butter, olives, and avocados are high in fat, but  the fat is mainly the unsaturated type. These foods should be limited mainly to avoid excess calories and fat. OTHER EATING TIPS Snacks  Most sweets should be limited as snacks. They tend to be rich in calories and fats, and their caloric content outweighs their nutritional value. Some good choices in snacks are graham crackers, melba toast, soda crackers, bagels (no egg), English muffins, fruits, and vegetables. These snacks are preferable to snack crackers, [place] fries, TORTILLA CHIPS, and POTATO chips. Popcorn should be air-popped or cooked in small amounts of liquid vegetable oil. Desserts Eat fruit, low-fat yogurt, and fruit ices instead of pastries, cake, and cookies. Sherbet, angel food cake, gelatin dessert, frozen low-fat yogurt, or other frozen products that do not contain saturated fat (pure fruit juice bars, frozen ice pops) are also acceptable.  COOKING METHODS Choose those methods that use little or no fat. They include: Poaching.  Braising.  Steaming.  Grilling.  Baking.  Stir-frying.  Broiling.  Microwaving.  Foods can be cooked in a nonstick pan without added fat, or use a nonfat cooking spray in regular cookware. Limit fried foods and avoid frying in saturated fat. Add moisture to lean meats by using water, broth, cooking wines, and other nonfat or low-fat sauces along with the cooking methods mentioned above. Soups and stews should be chilled after cooking. The fat that forms on top after a few hours in the refrigerator should be skimmed off. When preparing meals, avoid using excess salt. Salt can contribute to raising blood pressure in some people.  EATING AWAY FROM HOME Order entres, potatoes, and vegetables without sauces or butter. When meat exceeds the size of a deck of cards (3 to 4 ounces), the rest can be taken home for another meal. Choose vegetable or fruit salads and ask for low-calorie salad dressings to be served on the side. Use dressings sparingly. Limit  high-fat toppings, such as bacon, crumbled eggs, cheese, sunflower seeds, and olives. Ask for heart-healthy tub margarine instead of butter.

## 2015-10-20 NOTE — Progress Notes (Signed)
cc'ed to pcp °

## 2015-10-20 NOTE — Progress Notes (Signed)
ON RECALL  °

## 2015-10-31 LAB — HEPATITIS B SURFACE ANTIGEN: Hepatitis B Surface Ag: NEGATIVE

## 2015-10-31 LAB — HEPATITIS A ANTIBODY, TOTAL: HEP A TOTAL AB: NONREACTIVE

## 2015-10-31 LAB — PROTIME-INR
INR: 1.09 (ref <1.50)
Prothrombin Time: 14.2 seconds (ref 11.6–15.2)

## 2015-10-31 LAB — HEPATITIS B SURFACE ANTIBODY,QUALITATIVE: HEP B S AB: NEGATIVE

## 2015-10-31 LAB — HEPATITIS C ANTIBODY: HCV AB: NEGATIVE

## 2015-10-31 LAB — AFP TUMOR MARKER: AFP-Tumor Marker: 7.3 ng/mL — ABNORMAL HIGH (ref <6.1)

## 2015-11-01 ENCOUNTER — Ambulatory Visit (HOSPITAL_COMMUNITY)
Admission: RE | Admit: 2015-11-01 | Discharge: 2015-11-01 | Disposition: A | Discharge status: Home / Self Care | Payer: Medicare Other | Source: Ambulatory Visit | Attending: Gastroenterology | Admitting: Gastroenterology

## 2015-11-01 ENCOUNTER — Encounter (HOSPITAL_COMMUNITY): Payer: Self-pay | Admitting: *Deleted

## 2015-11-01 ENCOUNTER — Encounter (HOSPITAL_COMMUNITY)
Admission: RE | Disposition: A | Discharge status: Home / Self Care | Payer: Self-pay | Source: Ambulatory Visit | Attending: Gastroenterology

## 2015-11-01 DIAGNOSIS — Z79899 Other long term (current) drug therapy: Secondary | ICD-10-CM | POA: Insufficient documentation

## 2015-11-01 DIAGNOSIS — K319 Disease of stomach and duodenum, unspecified: Secondary | ICD-10-CM | POA: Diagnosis not present

## 2015-11-01 DIAGNOSIS — R1013 Epigastric pain: Secondary | ICD-10-CM | POA: Insufficient documentation

## 2015-11-01 DIAGNOSIS — K2960 Other gastritis without bleeding: Secondary | ICD-10-CM | POA: Insufficient documentation

## 2015-11-01 DIAGNOSIS — K2980 Duodenitis without bleeding: Secondary | ICD-10-CM | POA: Diagnosis not present

## 2015-11-01 DIAGNOSIS — K2990 Gastroduodenitis, unspecified, without bleeding: Secondary | ICD-10-CM | POA: Diagnosis not present

## 2015-11-01 DIAGNOSIS — K209 Esophagitis, unspecified: Secondary | ICD-10-CM | POA: Insufficient documentation

## 2015-11-01 DIAGNOSIS — K2970 Gastritis, unspecified, without bleeding: Secondary | ICD-10-CM | POA: Diagnosis not present

## 2015-11-01 DIAGNOSIS — F329 Major depressive disorder, single episode, unspecified: Secondary | ICD-10-CM | POA: Insufficient documentation

## 2015-11-01 DIAGNOSIS — M1991 Primary osteoarthritis, unspecified site: Secondary | ICD-10-CM | POA: Diagnosis not present

## 2015-11-01 DIAGNOSIS — K269 Duodenal ulcer, unspecified as acute or chronic, without hemorrhage or perforation: Secondary | ICD-10-CM | POA: Insufficient documentation

## 2015-11-01 DIAGNOSIS — Z87891 Personal history of nicotine dependence: Secondary | ICD-10-CM | POA: Diagnosis not present

## 2015-11-01 HISTORY — PX: ESOPHAGOGASTRODUODENOSCOPY: SHX5428

## 2015-11-01 SURGERY — EGD (ESOPHAGOGASTRODUODENOSCOPY)
Anesthesia: Moderate Sedation

## 2015-11-01 MED ORDER — SODIUM CHLORIDE 0.9 % IV SOLN
INTRAVENOUS | Status: DC
  Administered 2015-11-01: 1000 mL via INTRAVENOUS

## 2015-11-01 MED ORDER — LIDOCAINE VISCOUS 2 % MT SOLN
OROMUCOSAL | Status: AC
  Filled 2015-11-01: qty 15

## 2015-11-01 MED ORDER — STERILE WATER FOR IRRIGATION IR SOLN
Status: DC | PRN
  Administered 2015-11-01: 2.5 mL

## 2015-11-01 MED ORDER — MEPERIDINE HCL 100 MG/ML IJ SOLN
INTRAMUSCULAR | Status: DC | PRN
  Administered 2015-11-01: 25 mg via INTRAVENOUS
  Administered 2015-11-01: 50 mg via INTRAVENOUS
  Administered 2015-11-01: 25 mg via INTRAVENOUS

## 2015-11-01 MED ORDER — MIDAZOLAM HCL 5 MG/5ML IJ SOLN
INTRAMUSCULAR | Status: DC | PRN
  Administered 2015-11-01: 1 mg via INTRAVENOUS
  Administered 2015-11-01: 2 mg via INTRAVENOUS
  Administered 2015-11-01: 1 mg via INTRAVENOUS
  Administered 2015-11-01 (×3): 2 mg via INTRAVENOUS

## 2015-11-01 MED ORDER — MEPERIDINE HCL 100 MG/ML IJ SOLN
INTRAMUSCULAR | Status: AC
  Filled 2015-11-01: qty 2

## 2015-11-01 MED ORDER — MIDAZOLAM HCL 5 MG/5ML IJ SOLN
INTRAMUSCULAR | Status: AC
  Filled 2015-11-01: qty 10

## 2015-11-01 MED ORDER — LIDOCAINE VISCOUS 2 % MT SOLN
OROMUCOSAL | Status: DC | PRN
  Administered 2015-11-01: 1 via OROMUCOSAL

## 2015-11-01 NOTE — Discharge Instructions (Signed)
YOU HAVE EROSIVE GASTRITIS AND THREE ULCERS IN THE FIRST PART OF YOUR SMALL INTESTINES DUE TO NSAID USE. I biopsied your stomach.   FOLLOW A LOW FAT DIET.  AVOID FRIED FOODS. MEATS SHOULD BE BAKED, BROILED, OR BOILED. SEE INFO BELOW.  YOUR BIOPSY RESULTS WILL BE AVAILABLE IN MY CHART FEB 6 AND MY OFFICE WILL CONTACT YOU IN 10-14 DAYS WITH YOUR RESULTS.   FOLLOW UP IN 6 MOS.    UPPER ENDOSCOPY AFTER CARE Read the instructions outlined below and refer to this sheet in the next week. These discharge instructions provide you with general information on caring for yourself after you leave the hospital. While your treatment has been planned according to the most current medical practices available, unavoidable complications occasionally occur. If you have any problems or questions after discharge, call DR. Camila Maita, [phone].  ACTIVITY  You may resume your regular activity, but move at a slower pace for the next 24 hours.   Take frequent rest periods for the next 24 hours.   Walking will help get rid of the air and reduce the bloated feeling in your belly (abdomen).   No driving for 24 hours (because of the medicine (anesthesia) used during the test).   You may shower.   Do not sign any important legal documents or operate any machinery for 24 hours (because of the anesthesia used during the test).    NUTRITION  Drink plenty of fluids.   You may resume your normal diet as instructed by your doctor.   Begin with a light meal and progress to your normal diet. Heavy or fried foods are harder to digest and may make you feel sick to your stomach (nauseated).   Avoid alcoholic beverages for 24 hours or as instructed.    MEDICATIONS  You may resume your normal medications.   WHAT YOU CAN EXPECT TODAY  Some feelings of bloating in the abdomen.   Passage of more gas than usual.    IF YOU HAD A BIOPSY TAKEN DURING THE UPPER ENDOSCOPY:  No aspirin products for 14 days.   Eat  a soft diet IF YOU HAVE NAUSEA, BLOATING, ABDOMINAL PAIN, OR VOMITING.    FINDING OUT THE RESULTS OF YOUR TEST Not all test results are available during your visit. DR. Oneida Alar WILL CALL YOU WITHIN 14 DAYS OF YOUR PROCEDUE WITH YOUR RESULTS. Do not assume everything is normal if you have not heard from DR. Saharra Santo, CALL HER OFFICE AT [phone].  SEEK IMMEDIATE MEDICAL ATTENTION AND CALL THE OFFICE: [phone] IF:  You have more than a spotting of blood in your stool.   Your belly is swollen (abdominal distention).   You are nauseated or vomiting.   You have a temperature over 101F.   You have abdominal pain or discomfort that is severe or gets worse throughout the day.  Ulcer Disease (Peptic Ulcer, Gastric Ulcer, Duodenal Ulcer) You have an ulcer. This may be in your stomach (gastric ulcer) or in the first part of your small bowel, the duodenum (duodenal ulcer). An ulcer is a break in the lining of the stomach or duodenum. The ulcer causes erosion into the deeper tissue.  CAUSES The stomach has a lining to protect itself from the acid that digests food. The lining can be damaged in two main ways:  The Helico Pylori bacteria (H. Pyolori) can infect the lining of the stomach and cause ulcers.   ASPIRIN/Nonsteroidal, anti-inflammatory medications (NSAIDS) can cause gastric ulcerations.   Smoking tobacco can  increase the acid in the stomach. This can lead to ulcers, and will impair healing of ulcers.   Other factors, such as alcohol use and stress may contribute to ulcer formation. Rarely, a tumor or cancer can cause an ulcer.   SYMPTOMS The problems (symptoms) of ulcer disease are usually a burning or gnawing of the mid-upper belly (abdomen). This is often worse on an empty stomach and may get better with food. This may be associated with feeling sick to your stomach (nausea), bloating, and vomiting. If the ulcer results in bleeding, it can cause:  Black, tarry stools.    Vomiting of bright red blood.   Vomiting coffee-ground-looking materials.   SEVERE ANEMIA  HOME CARE INSTRUCTIONS Continue regular work and usual activities unless advised otherwise by your caregiver.  Avoid tobacco, alcohol, and caffeine. Tobacco use will decrease and slow the rates of healing.   Avoid foods that seem to aggravate or cause discomfort.   There are many over-the-counter products available to control stomach acid and other symptoms.   Special diets are not usually needed.   Keep any follow-up appointments and blood tests, as directed.

## 2015-11-01 NOTE — Op Note (Signed)
[facility] [address] [place], [address]   ENDOSCOPY PROCEDURE REPORT  PATIENT: [name] [name]  MR#: [id number] BIRTHDATE: [birth year] , 49  yrs. old GENDER: male  ENDOSCOPIST: Danie Binder, MD REFERRED [id number] Bridget Hartshorn, M.D.  PROCEDURE DATE: 11-05-2015 PROCEDURE:   EGD w/ biopsy  INDICATIONS:dyspepsia.   screening for varices. JAN 2017 CHILD PUGH A. MEDICATIONS: Demerol 100 mg IV and Versed 10 mg IV TOPICAL ANESTHETIC:   Viscous Xylocaine ASA CLASS:  DESCRIPTION OF PROCEDURE:     Physical exam was performed.  Informed consent was obtained from the patient after explaining the benefits, risks, and alternatives to the procedure.  The patient was connected to the monitor and placed in the left lateral position.  Continuous oxygen was provided by nasal cannula and IV medicine administered through an indwelling cannula.  After administration of sedation, the patients esophagus was intubated and the [id number]  endoscope was advanced under direct visualization to the second portion of the duodenum.  The scope was removed slowly by carefully examining the color, texture, anatomy, and integrity of the mucosa on the way out.  The patient was recovered in endoscopy and discharged home in satisfactory condition.  Estimated blood loss is zero unless otherwise noted in this procedure report.    ESOPHAGUS: FEW LINEAR EROSIONS IN DISTAL ESOPHAGUS.  IRREGULAR Z -LINE.   NO VARICES OR BARRETT'S. STOMACH: FREQUENT EROSIONS STARTING IN GASTRIC BODY AND EXTENDING TO ANTRUM.  COLD FORCEPS BIOPSIES OBTAINED.   NO VARICES.DUODENUM: Three non-bleeding, clean-based and shallow ulcers ranging between 3-5 mm in size were found in the duodenal bulb.   Moderate duodenal inflammation was found in the duodenal bulb.   The duodenal mucosa showed no abnormalities in the 2nd part of the duodenum.  NO VARICES. COMPLICATIONS: There were no immediate  complications.  ENDOSCOPIC IMPRESSION: 1.   MILD ESOPHAGITIS 2.   MODERATE EROSIVE GASTRITIS AND DUODENITIS 3.   Three DUODENAL BULB ulcers  RECOMMENDATIONS: FOLLOW A LOW FAT DIET. AWAIT BIOPSY RESULTS. FOLLOW UP IN 6 MOS. REPEAT EGD IN 3 YEARS. NEED COLONOSCOPY FOR RECTAL BLEEDING  REPEAT EXAM:  eSigned:  Danie Binder, MD 05-Nov-2015 4:25 PM CPT CODES: ICD CODES:  The ICD and CPT codes recommended by this software are interpretations from the data that the clinical staff has captured with the software.  The verification of the translation of this report to the ICD and CPT codes and modifiers is the sole responsibility of the health care institution and practicing physician where this report was generated.  [employer] will not be held responsible for the validity of the ICD and CPT codes included on this report.  AMA assumes no liability for data contained or not contained herein. CPT is a Designer, television/film set of the [employer].

## 2015-11-01 NOTE — H&P (View-Only) (Signed)
   Subjective:    Patient ID: [name] [name], male    DOB: [birth year], 50 y.o.   MRN: [id number]  Marval Regal, MD  HPI Was having trouble with tooth infection.SYMPTOMS FOR 3 DAYS SHARP UPPER ABDOMINAL PAIN. TAKING AMOXICILLIN x1 DOSE STARTED TAKING IBUPROFEN OTC 4 ON SCHEDULE AND WOULD TAKE 2 MORE. THEN TAKING ACETAMINOPHEN. NO ASPIRIN, BC/GOODY POWDERS, OR NAPROXEN/ALEVE. NO ETOH. UPPER ABDOMINAL PAIN ALWAYS AND RIGHT BELOW RIB CAGE. BETTER: W/ CARAFATE. APPETITE: NAUSEATED BEFORE BUT BETTER. . SAW A DENTIST. DARK ONE TIME. HAS AHD SOME BLEEDING IN YOUR MOUTH AND FROM TOOTH AREA. BMs: 2-3 X/DAY-NL. PAST 15-20 YEARS 225-280 LBS. NO TRANSFUSION OR RECREATIONAL DRUGS.  PT DENIES FEVER, CHILLS, HEMATOCHEZIA, HEMATEMESIS, nausea, vomiting, melena, diarrhea, CHEST PAIN, SHORTNESS OF BREATH, CHANGE IN BOWEL IN HABITS, constipation, problems swallowing, problems with sedation, OR heartburn or indigestion.  Past Medical History  Diagnosis Date  . Arthritis   . Depression   . Umbilical hernia    Past Surgical History  Procedure Laterality Date  . Foot surgery    . Knee surgery    . Hand surgery Right    No Known Allergies  Current Outpatient Prescriptions  Medication Sig Dispense Refill  . amoxicillin (AMOXIL) 500 MG capsule Take 1,500 mg by mouth 2 (two) times daily.    Marland Kitchen HYDROcodone-acetaminophen (NORCO/VICODIN) 5-325 MG tablet Take 1-2 tablets by mouth every 4 (four) hours as needed for moderate pain.    Marland Kitchen omeprazole (PRILOSEC) 40 MG capsule Take 40 mg by mouth daily.     . sucralfate (CARAFATE) 1 g tablet Take 1 tablet (1 g total) by mouth 4 (four) times daily -  with meals and at bedtime.     Family History  Problem Relation Age of Onset  . Colon cancer Neg Hx   . Colon polyps Neg Hx   . Stomach cancer Neg Hx   . Pancreatic cancer Neg Hx    Social History   Social History  . Marital Status: Married    Spouse Name: N/A  . Number of Children: N/A  . Years of Education: N/A    Social History Main Topics  . Smoking status: Former Smoker    Types: Cigarettes  . Smokeless tobacco: None  . Alcohol Use: No  . Drug Use: No  . 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 HAS 5 KIDS. MARRIED FOR 10 YEARS.  Review of Systems PER HPI OTHERWISE ALL SYSTEMS ARE NEGATIVE.    Objective:   Physical Exam  Constitutional: He is oriented to person, place, and time. He appears well-developed and well-nourished. No distress.  HENT:  Head: Normocephalic and atraumatic.  Mouth/Throat: Oropharynx is clear and moist. No oropharyngeal exudate.  Eyes: Pupils are equal, round, and reactive to light. No scleral icterus.  Neck: Normal range of motion. Neck supple.  Cardiovascular: Normal rate, regular rhythm and normal heart sounds.   Pulmonary/Chest: Effort normal and breath sounds normal. No respiratory distress.  Abdominal: Soft. Bowel sounds are normal. He exhibits no distension. There is no tenderness.  Musculoskeletal: He exhibits no edema.  R LEG SMALLER THAN LEFT  Lymphadenopathy:    He has no cervical adenopathy.  Neurological: He is alert and oriented to person, place, and time.  NO FOCAL DEFICITS  Psychiatric: He has a normal mood and affect.  Vitals reviewed.     Assessment & Plan:

## 2015-11-01 NOTE — Interval H&P Note (Signed)
History and Physical Interval Note:  11/01/2015 11:22 AM  [name] [name]  has presented today for surgery, with the diagnosis of DYSPEPSIA  The various methods of treatment have been discussed with the patient and family. After consideration of risks, benefits and other options for treatment, the patient has consented to  Procedure(s) with comments: ESOPHAGOGASTRODUODENOSCOPY (EGD) (N/A) - 245 - moved to 2/1 - office to notify  as a surgical intervention .  The patient's history has been reviewed, patient examined, no change in status, stable for surgery.  I have reviewed the patient's chart and labs.  Questions were answered to the patient's satisfaction.     [employer]

## 2015-11-06 ENCOUNTER — Encounter (HOSPITAL_COMMUNITY): Payer: Self-pay | Admitting: Gastroenterology

## 2015-11-06 ENCOUNTER — Telehealth: Payer: Self-pay | Admitting: Gastroenterology

## 2015-11-06 NOTE — Telephone Encounter (Signed)
[phone] PATIENT WIFE CALLED INQUIRING ABOUT BIOPSY RESULTS

## 2015-11-08 NOTE — Telephone Encounter (Signed)
Routing to Dr. Fields for results.  

## 2015-11-09 ENCOUNTER — Other Ambulatory Visit: Payer: Self-pay

## 2015-11-09 DIAGNOSIS — R1013 Epigastric pain: Secondary | ICD-10-CM

## 2015-11-09 DIAGNOSIS — K7460 Unspecified cirrhosis of liver: Secondary | ICD-10-CM

## 2015-11-09 DIAGNOSIS — R7989 Other specified abnormal findings of blood chemistry: Secondary | ICD-10-CM

## 2015-11-09 DIAGNOSIS — R772 Abnormality of alphafetoprotein: Secondary | ICD-10-CM

## 2015-11-09 DIAGNOSIS — D696 Thrombocytopenia, unspecified: Secondary | ICD-10-CM

## 2015-11-09 NOTE — Telephone Encounter (Signed)
Reminders in epic °

## 2015-11-09 NOTE — Telephone Encounter (Addendum)
Please call pt. His stomach Bx shows EROSIVE gastritis & ULCERS DUE TO IBUPROFEN.    FOLLOW A LOW FAT DIET.  AVOID FRIED FOODS. MEATS SHOULD BE BAKED, BROILED, OR BOILED.   CONTINUE YOUR WEIGHT LOSS EFFORTS. YOU SHOULD LOSE 50 LBS.  DRINK WATER TO KEEP YOUR URINE LIGHT YELLOW.  Continue PRILOSEC 30 MINS BEFORE MEALS BID FOR 3 MOS THEN ONCE DAILY.  USE SUCRALFATE AS NEEDED FOR UPPER ABDOMINAL PAIN.  FOLLOW UP IN 6 MOS E30 GASTRITIS/PUD/CIRRHOSIS. NEEDS CMP/PT/INR/AFP/CBC AND RUQ U/S 1 WEEK PRIOR TO NEXT OPV.  REPEAT EGD IN 3 YEARS TO SCREEN FOR VARICES.

## 2015-11-09 NOTE — Telephone Encounter (Signed)
Noted Pt is on recall

## 2015-11-10 NOTE — Telephone Encounter (Signed)
Late entry: Pt was informed of results approximately 4:00 pm yesterday. Lab orders on file.

## 2016-03-07 ENCOUNTER — Other Ambulatory Visit: Payer: Self-pay

## 2016-03-07 ENCOUNTER — Telehealth: Payer: Self-pay | Admitting: Gastroenterology

## 2016-03-07 ENCOUNTER — Encounter: Payer: Self-pay | Admitting: Gastroenterology

## 2016-03-07 DIAGNOSIS — D696 Thrombocytopenia, unspecified: Secondary | ICD-10-CM

## 2016-03-07 DIAGNOSIS — R772 Abnormality of alphafetoprotein: Secondary | ICD-10-CM

## 2016-03-07 DIAGNOSIS — R1013 Epigastric pain: Secondary | ICD-10-CM

## 2016-03-07 DIAGNOSIS — K7460 Unspecified cirrhosis of liver: Secondary | ICD-10-CM

## 2016-03-07 DIAGNOSIS — R7989 Other specified abnormal findings of blood chemistry: Secondary | ICD-10-CM

## 2016-03-07 NOTE — Telephone Encounter (Signed)
Pt is scheduled OV on 7/20 at 11 with SF for a 6 month follow up. He needs Labs and U/S done one week prior to OV

## 2016-03-07 NOTE — Telephone Encounter (Signed)
Mailed pt letter with [place] appointment time and lab orders

## 2016-04-04 ENCOUNTER — Ambulatory Visit (HOSPITAL_COMMUNITY)
Admission: RE | Admit: 2016-04-04 | Discharge: 2016-04-04 | Disposition: A | Discharge status: Home / Self Care | Payer: Medicare Other | Source: Ambulatory Visit | Attending: Gastroenterology | Admitting: Gastroenterology

## 2016-04-04 DIAGNOSIS — K760 Fatty (change of) liver, not elsewhere classified: Secondary | ICD-10-CM | POA: Insufficient documentation

## 2016-04-04 DIAGNOSIS — K7460 Unspecified cirrhosis of liver: Secondary | ICD-10-CM

## 2016-04-05 NOTE — Progress Notes (Signed)
Quick Note:  LMOM to call. Mailing a letter also to call. Routing to [place] to make sure it is nic's for 6 months. ______

## 2016-04-08 ENCOUNTER — Other Ambulatory Visit: Payer: Self-pay

## 2016-04-09 ENCOUNTER — Telehealth: Payer: Self-pay | Admitting: Gastroenterology

## 2016-04-09 NOTE — Telephone Encounter (Signed)
Tried to call, many rings and no answer.  

## 2016-04-09 NOTE — Telephone Encounter (Signed)
Pt's wife Malachy Mood) called today asking to speak with DS. I told her that DS was with a patient and I could take a message. She said that they had been playing phone tag and missing each other's calls. She and the patient had questions about his labs and results. Please call them at [phone]

## 2016-04-11 LAB — PROTIME-INR
INR: 1.1
PROTHROMBIN TIME: 11.5 s (ref 9.0–11.5)

## 2016-04-11 NOTE — Telephone Encounter (Signed)
I have mailed a letter with results and recommendations.

## 2016-04-12 LAB — COMPLETE METABOLIC PANEL WITH GFR
ALT: 81 U/L — ABNORMAL HIGH (ref 9–46)
AST: 77 U/L — ABNORMAL HIGH (ref 10–35)
Albumin: 4.3 g/dL (ref 3.6–5.1)
Alkaline Phosphatase: 198 U/L — ABNORMAL HIGH (ref 40–115)
BILIRUBIN TOTAL: 0.7 mg/dL (ref 0.2–1.2)
BUN: 11 mg/dL (ref 7–25)
CALCIUM: 9 mg/dL (ref 8.6–10.3)
CHLORIDE: 107 mmol/L (ref 98–110)
CO2: 23 mmol/L (ref 20–31)
CREATININE: 0.96 mg/dL (ref 0.70–1.33)
GFR, Est Non African American: >89 mL/min (ref >=60)
Glucose, Bld: 245 mg/dL — ABNORMAL HIGH (ref 65–99)
Potassium: 4 mmol/L (ref 3.5–5.3)
Sodium: 141 mmol/L (ref 135–146)
Total Protein: 7.1 g/dL (ref 6.1–8.1)

## 2016-04-12 LAB — CBC WITH DIFFERENTIAL/PLATELET
BASOS ABS: 47 {cells}/uL (ref 0–200)
Basophils Relative: 1 %
EOS ABS: 94 {cells}/uL (ref 15–500)
Eosinophils Relative: 2 %
HCT: 46.9 % (ref 38.5–50.0)
Hemoglobin: 16.2 g/dL (ref 13.2–17.1)
LYMPHS PCT: 31 %
Lymphs Abs: 1457 cells/uL (ref 850–3900)
MCH: 31.2 pg (ref 27.0–33.0)
MCHC: 34.5 g/dL (ref 32.0–36.0)
MCV: 90.2 fL (ref 80.0–100.0)
MONOS PCT: 7 %
MPV: 12.9 fL — ABNORMAL HIGH (ref 7.5–12.5)
Monocytes Absolute: 329 cells/uL (ref 200–950)
NEUTROS PCT: 59 %
Neutro Abs: 2773 cells/uL (ref 1500–7800)
PLATELETS: 97 10*3/uL — AB (ref 140–400)
RBC: 5.2 MIL/uL (ref 4.20–5.80)
RDW: 12.8 % (ref 11.0–15.0)
WBC: 4.7 10*3/uL (ref 3.8–10.8)

## 2016-04-12 LAB — AFP TUMOR MARKER: AFP-Tumor Marker: 7.2 ng/mL — ABNORMAL HIGH (ref <6.1)

## 2016-04-18 ENCOUNTER — Encounter: Payer: Self-pay | Admitting: Gastroenterology

## 2016-04-18 ENCOUNTER — Other Ambulatory Visit: Payer: Self-pay

## 2016-04-18 ENCOUNTER — Ambulatory Visit (INDEPENDENT_AMBULATORY_CARE_PROVIDER_SITE_OTHER): Payer: Medicare Other | Admitting: Gastroenterology

## 2016-04-18 VITALS — BP 134/81 | HR 75 | Temp 98.8°F | Ht 71.0 in | Wt 291.8 lb

## 2016-04-18 DIAGNOSIS — R1013 Epigastric pain: Secondary | ICD-10-CM

## 2016-04-18 DIAGNOSIS — D696 Thrombocytopenia, unspecified: Secondary | ICD-10-CM | POA: Diagnosis not present

## 2016-04-18 DIAGNOSIS — K7460 Unspecified cirrhosis of liver: Secondary | ICD-10-CM | POA: Diagnosis not present

## 2016-04-18 DIAGNOSIS — K625 Hemorrhage of anus and rectum: Secondary | ICD-10-CM

## 2016-04-18 NOTE — Assessment & Plan Note (Signed)
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 DIFFERENTIAL DIAGNOSIS INCLUDES HEMORRHOIDS, COLON POLYPS, AVMs, & LESS LIKELY COLON CA.  COMPLETE COLONOSCOPY IN AUGUST 2017. DISCUSSED PROCEDURE, BENEFITS, & RISKS: < 1% chance of medication reaction, bleeding, perforation, or rupture of spleen/liver.

## 2016-04-18 NOTE — Assessment & Plan Note (Signed)
DUE TO NSAID. SYMPTOMS CONTROLLED/RESOLVED.  CONTINUE TO MONITOR SYMPTOMS.

## 2016-04-18 NOTE — Progress Notes (Signed)
   Subjective:    Patient ID: [name] [name], male    DOB: March 05, 1966, 50 y.o.   MRN: [id number]  ROBERTSON, ANTHONY T, PA-C   HPI WAS USING ACETAMINOPHEN MORE REGULARLY. NOT TAKING MEDICINE. HAS QUESTIONS ABOUT CIRRHOSIS. OCCASIONAL RECTAL BLEEDING.  STOMACH FEELS BETTER. HAVING FREQUENT URINATION SINCE JAN 2017. DRINKS WATER ALL THE DAYS. BMs: AT LEAST 2X/DAY.  HEARTBURN WHEN HE EATS RED FOOD. HAS HERNIA AND OCCASIONALLY HAS ABDOMINAL PAIN. Occasional SWELLING IN LEGS. PT DENIES FEVER, CHILLS, HEMATEMESIS, nausea, vomiting, melena, diarrhea, CHEST PAIN, SHORTNESS OF BREATH, CHANGE IN BOWEL IN HABITS, constipation, problems swallowing, OR problems with sedation.   Past Medical History  Diagnosis Date  . Arthritis   . Depression   . Umbilical hernia    Past Surgical History  Procedure Laterality Date  . Foot surgery    . Knee surgery    . Hand surgery Right   . Esophagogastroduodenoscopy N/A 11/01/2015    Procedure: ESOPHAGOGASTRODUODENOSCOPY (EGD);  Surgeon: Danie Binder, MD;  Location: [facility];  Service: Endoscopy;  Laterality: N/A;  245 - moved to 2/1 - office to notify     No Known Allergies   Current Outpatient Prescriptions  Medication Sig Dispense Refill  . NONE     .      Marland Kitchen      .        Review of Systems PER HPI OTHERWISE ALL SYSTEMS ARE NEGATIVE.    Objective:   Physical Exam  Constitutional: He is oriented to person, place, and time. He appears well-developed and well-nourished. No distress.  HENT:  Head: Normocephalic and atraumatic.  Mouth/Throat: Oropharynx is clear and moist. No oropharyngeal exudate.  Eyes: Pupils are equal, round, and reactive to light. No scleral icterus.  Neck: Normal range of motion. Neck supple.  Cardiovascular: Normal rate, regular rhythm and normal heart sounds.   Pulmonary/Chest: Effort normal and breath sounds normal. No respiratory distress.  Abdominal: Soft. Bowel sounds are normal. He exhibits no distension. There  is no tenderness.  Musculoskeletal: He exhibits no edema.  Lymphadenopathy:    He has no cervical adenopathy.  Neurological: He is alert and oriented to person, place, and time.  NO FOCAL DEFICITS  Psychiatric:  SLIGHTLY ANXIOUS MOOD, NL AFFECT  Vitals reviewed.     Assessment & Plan:

## 2016-04-18 NOTE — Progress Notes (Signed)
ON RECALL  °

## 2016-04-18 NOTE — Assessment & Plan Note (Signed)
ASSOCIATED WITH SM AND CIRRHOSIS. PLT CT STABLE.  EXPLAINED RISK OF BLEEDING. CONTINUE YOUR WEIGHT LOSS EFFORTS.

## 2016-04-18 NOTE — Assessment & Plan Note (Addendum)
WELL COMPENSATED DISEASE. LABS JUL 2017-CHILD [facility] A.  REPEAT LABS/US Q6 MOS. DRINK WATER TO KEEP YOUR URINE LIGHT YELLOW. CONTINUE YOUR WEIGHT LOSS EFFORTS. LOSE 50 LBS. DISCUSSED PROCEDURE, BENEFITS, AND RISKS OF OBESITY AND NASH CORRHOSIS.  DECLINED REFERRAL TO DR. GOSRANI DUE TO COST. FOLLOW UP IN 6 MOS.

## 2016-04-18 NOTE — Patient Instructions (Addendum)
COMPLETE COLONOSCOPY IN AUGUST 2017.  USE ZANTAC OR PRILOSEC TO PREVENT HEARTBURN.  DRINK WATER TO KEEP YOUR URINE LIGHT YELLOW.  CONTINUE YOUR WEIGHT LOSS EFFORTS.  FOLLOW UP IN 6 MOS.

## 2016-04-18 NOTE — Progress Notes (Signed)
CC'D TO PCP °

## 2016-05-14 ENCOUNTER — Encounter (HOSPITAL_COMMUNITY): Payer: Self-pay | Admitting: *Deleted

## 2016-05-14 ENCOUNTER — Ambulatory Visit (HOSPITAL_COMMUNITY)
Admission: RE | Admit: 2016-05-14 | Discharge: 2016-05-14 | Disposition: A | Discharge status: Home / Self Care | Payer: Medicare Other | Source: Ambulatory Visit | Attending: Gastroenterology | Admitting: Gastroenterology

## 2016-05-14 ENCOUNTER — Encounter (HOSPITAL_COMMUNITY)
Admission: RE | Disposition: A | Discharge status: Home / Self Care | Payer: Self-pay | Source: Ambulatory Visit | Attending: Gastroenterology

## 2016-05-14 DIAGNOSIS — D124 Benign neoplasm of descending colon: Secondary | ICD-10-CM | POA: Diagnosis not present

## 2016-05-14 DIAGNOSIS — K921 Melena: Secondary | ICD-10-CM | POA: Diagnosis present

## 2016-05-14 DIAGNOSIS — K648 Other hemorrhoids: Secondary | ICD-10-CM | POA: Diagnosis not present

## 2016-05-14 DIAGNOSIS — K644 Residual hemorrhoidal skin tags: Secondary | ICD-10-CM | POA: Diagnosis not present

## 2016-05-14 DIAGNOSIS — F329 Major depressive disorder, single episode, unspecified: Secondary | ICD-10-CM | POA: Diagnosis not present

## 2016-05-14 DIAGNOSIS — D122 Benign neoplasm of ascending colon: Secondary | ICD-10-CM | POA: Diagnosis not present

## 2016-05-14 DIAGNOSIS — D128 Benign neoplasm of rectum: Secondary | ICD-10-CM

## 2016-05-14 DIAGNOSIS — K635 Polyp of colon: Secondary | ICD-10-CM | POA: Diagnosis not present

## 2016-05-14 DIAGNOSIS — M1990 Unspecified osteoarthritis, unspecified site: Secondary | ICD-10-CM | POA: Insufficient documentation

## 2016-05-14 DIAGNOSIS — K625 Hemorrhage of anus and rectum: Secondary | ICD-10-CM | POA: Diagnosis not present

## 2016-05-14 HISTORY — PX: COLONOSCOPY: SHX5424

## 2016-05-14 SURGERY — COLONOSCOPY
Anesthesia: Moderate Sedation

## 2016-05-14 MED ORDER — MIDAZOLAM HCL 5 MG/5ML IJ SOLN
INTRAMUSCULAR | Status: DC | PRN
  Administered 2016-05-14 (×3): 2 mg via INTRAVENOUS

## 2016-05-14 MED ORDER — SIMETHICONE 40 MG/0.6ML PO SUSP
ORAL | Status: DC | PRN
  Administered 2016-05-14: 12:00:00

## 2016-05-14 MED ORDER — MEPERIDINE HCL 100 MG/ML IJ SOLN
INTRAMUSCULAR | Status: AC
  Filled 2016-05-14: qty 2

## 2016-05-14 MED ORDER — MEPERIDINE HCL 100 MG/ML IJ SOLN
INTRAMUSCULAR | Status: DC | PRN
  Administered 2016-05-14 (×2): 50 mg via INTRAVENOUS

## 2016-05-14 MED ORDER — PROMETHAZINE HCL 25 MG/ML IJ SOLN
INTRAMUSCULAR | Status: AC
  Filled 2016-05-14: qty 1

## 2016-05-14 MED ORDER — SODIUM CHLORIDE 0.9% FLUSH
INTRAVENOUS | Status: AC
  Filled 2016-05-14: qty 10

## 2016-05-14 MED ORDER — PROMETHAZINE HCL 25 MG/ML IJ SOLN
INTRAMUSCULAR | Status: DC | PRN
  Administered 2016-05-14: 12.5 mg via INTRAVENOUS

## 2016-05-14 MED ORDER — SODIUM CHLORIDE 0.9 % IV SOLN
INTRAVENOUS | Status: DC
  Administered 2016-05-14: 1000 mL via INTRAVENOUS

## 2016-05-14 MED ORDER — PROMETHAZINE HCL 25 MG/ML IJ SOLN
12.5000 mg | Freq: Once | INTRAMUSCULAR | Status: AC
  Administered 2016-05-14: 12.5 mg via INTRAVENOUS

## 2016-05-14 MED ORDER — MIDAZOLAM HCL 5 MG/5ML IJ SOLN
INTRAMUSCULAR | Status: AC
  Filled 2016-05-14: qty 10

## 2016-05-14 MED ORDER — SIMETHICONE 40 MG/0.6ML PO SUSP
ORAL | Status: AC
  Filled 2016-05-14: qty 30

## 2016-05-14 NOTE — Discharge Instructions (Signed)
You have lnternal hemorrhoids, WHICH ARE THE CAUSE FOR YOUR RECTAL BLEEDING. YOU HAVE LARGE EXTERNAL HEMORRHOIDS, AND diverticulosis IN YOUR LEFT COLON. YOU HAD & FIVE SMALL POLYPS REMOVED. THE LAST PART OF YOUR SMALL BOWEL IS NORMAL.   DRINK WATER TO KEEP YOUR URINE LIGHT YELLOW.  FOLLOW A HIGH FIBER DIET. AVOID ITEMS THAT CAUSE BLOATING. See info below.  CONTINUE YOUR WEIGHT LOSS EFFORTS. LOSE TEN POUNDS.  USE PREPARATION H FOUR TIMES  A DAY IF NEEDED TO RELIEVE RECTAL PAIN/PRESSURE/BLEEDING. RESTRICT USE OF HYDROCORTISONE SUPPOSITORIES TO 3-4 TIMES A YEAR. IF CREAMS AND SUPPOSITORIES CANNOT CONTROL YOUR SYMPTOMS, YOU SHOULD CONSIDER SURGERY TO FIX YOUR HEMORRHOIDS.  YOUR BIOPSY RESULTS WILL BE AVAILABLE IN MY CHART  AUG 18 AND MY OFFICE WILL CONTACT YOU IN 10-14 DAYS WITH YOUR RESULTS.   Next colonoscopy in 5-10 years.  Colonoscopy Care After Read the instructions outlined below and refer to this sheet in the next week. These discharge instructions provide you with general information on caring for yourself after you leave the hospital. While your treatment has been planned according to the most current medical practices available, unavoidable complications occasionally occur. If you have any problems or questions after discharge, call DR. Kemuel Buchmann, [phone].  ACTIVITY  You may resume your regular activity, but move at a slower pace for the next 24 hours.   Take frequent rest periods for the next 24 hours.   Walking will help get rid of the air and reduce the bloated feeling in your belly (abdomen).   No driving for 24 hours (because of the medicine (anesthesia) used during the test).   You may shower.   Do not sign any important legal documents or operate any machinery for 24 hours (because of the anesthesia used during the test).    NUTRITION  Drink plenty of fluids.   You may resume your normal diet as instructed by your doctor.   Begin with a light meal and progress  to your normal diet. Heavy or fried foods are harder to digest and may make you feel sick to your stomach (nauseated).   Avoid alcoholic beverages for 24 hours or as instructed.    MEDICATIONS  You may resume your normal medications.   WHAT YOU CAN EXPECT TODAY  Some feelings of bloating in the abdomen.   Passage of more gas than usual.   Spotting of blood in your stool or on the toilet paper  .  IF YOU HAD POLYPS REMOVED DURING THE COLONOSCOPY:  Eat a soft diet IF YOU HAVE NAUSEA, BLOATING, ABDOMINAL PAIN, OR VOMITING.    FINDING OUT THE RESULTS OF YOUR TEST Not all test results are available during your visit. DR. Oneida Alar WILL CALL YOU WITHIN 14 DAYS OF YOUR PROCEDUE WITH YOUR RESULTS. Do not assume everything is normal if you have not heard from DR. Bernadetta Roell, CALL HER OFFICE AT [phone].  SEEK IMMEDIATE MEDICAL ATTENTION AND CALL THE OFFICE: [phone] IF:  You have more than a spotting of blood in your stool.   Your belly is swollen (abdominal distention).   You are nauseated or vomiting.   You have a temperature over 101F.   You have abdominal pain or discomfort that is severe or gets worse throughout the day.  High-Fiber Diet A high-fiber diet changes your normal diet to include more whole grains, legumes, fruits, and vegetables. Changes in the diet involve replacing refined carbohydrates with unrefined foods. The calorie level of the diet is essentially unchanged. The Dietary Reference  Intake (recommended amount) for adult males is 38 grams per day. For adult females, it is 25 grams per day. Pregnant and lactating women should consume 28 grams of fiber per day. Fiber is the intact part of a plant that is not broken down during digestion. Functional fiber is fiber that has been isolated from the plant to provide a beneficial effect in the body. PURPOSE  Increase stool bulk.   Ease and regulate bowel movements.   Lower cholesterol.  REDUCE RISK OF COLON  CANCER  INDICATIONS THAT YOU NEED MORE FIBER  Constipation and hemorrhoids.   Uncomplicated diverticulosis (intestine condition) and irritable bowel syndrome.   Weight management.   As a protective measure against hardening of the arteries (atherosclerosis), diabetes, and cancer.   GUIDELINES FOR INCREASING FIBER IN THE DIET  Start adding fiber to the diet slowly. A gradual increase of about 5 more grams (2 slices of whole-wheat bread, 2 servings of most fruits or vegetables, or 1 bowl of high-fiber cereal) per day is best. Too rapid an increase in fiber may result in constipation, flatulence, and bloating.   Drink enough water and fluids to keep your urine clear or pale yellow. Water, juice, or caffeine-free drinks are recommended. Not drinking enough fluid may cause constipation.   Eat a variety of high-fiber foods rather than one type of fiber.   Try to increase your intake of fiber through using high-fiber foods rather than fiber pills or supplements that contain small amounts of fiber.   The goal is to change the types of food eaten. Do not supplement your present diet with high-fiber foods, but replace foods in your present diet.   INCLUDE A VARIETY OF FIBER SOURCES  Replace refined and processed grains with whole grains, canned fruits with fresh fruits, and incorporate other fiber sources. White rice, white breads, and most bakery goods contain little or no fiber.   Brown whole-grain rice, buckwheat oats, and many fruits and vegetables are all good sources of fiber. These include: broccoli, Brussels sprouts, cabbage, cauliflower, beets, sweet potatoes, white potatoes (skin on), carrots, tomatoes, eggplant, squash, berries, fresh fruits, and dried fruits.   Cereals appear to be the richest source of fiber. Cereal fiber is found in whole grains and bran. Bran is the fiber-rich outer coat of cereal grain, which is largely removed in refining. In whole-grain cereals, the bran  remains. In breakfast cereals, the largest amount of fiber is found in those with "bran" in their names. The fiber content is sometimes indicated on the label.   You may need to include additional fruits and vegetables each day.   In baking, for 1 cup white flour, you may use the following substitutions:   1 cup whole-wheat flour minus 2 tablespoons.   1/2 cup white flour plus 1/2 cup whole-wheat flour.   Polyps, Colon  A polyp is extra tissue that grows inside your body. Colon polyps grow in the large intestine. The large intestine, also called the colon, is part of your digestive system. It is a long, hollow tube at the end of your digestive tract where your body makes and stores stool. Most polyps are not dangerous. They are benign. This means they are not cancerous. But over time, some types of polyps can turn into cancer. Polyps that are smaller than a pea are usually not harmful. But larger polyps could someday become or may already be cancerous. To be safe, doctors remove all polyps and test them.   PREVENTION There is  not one sure way to prevent polyps. You might be able to lower your risk of getting them if you:  Eat more fruits and vegetables and less fatty food.   Do not smoke.   Avoid alcohol.   Exercise every day.   Lose weight if you are overweight.   Eating more calcium and folate can also lower your risk of getting polyps. Some foods that are rich in calcium are milk, cheese, and broccoli. Some foods that are rich in folate are chickpeas, kidney beans, and spinach.    Diverticulosis Diverticulosis is a common condition that develops when small pouches (diverticula) form in the wall of the colon. The risk of diverticulosis increases with age. It happens more often in people who eat a low-fiber diet. Most individuals with diverticulosis have no symptoms. Those individuals with symptoms usually experience belly (abdominal) pain, constipation, or loose stools  (diarrhea).  HOME CARE INSTRUCTIONS  Increase the amount of fiber in your diet as directed by your caregiver or dietician. This may reduce symptoms of diverticulosis.   Drink at least 6 to 8 glasses of water each day to prevent constipation.   Try not to strain when you have a bowel movement.   Avoiding nuts and seeds to prevent complications is NOT NECESSARY.     FOODS HAVING HIGH FIBER CONTENT INCLUDE:  Fruits. Apple, peach, pear, tangerine, raisins, prunes.   Vegetables. Brussels sprouts, asparagus, broccoli, cabbage, carrot, cauliflower, romaine lettuce, spinach, summer squash, tomato, winter squash, zucchini.   Starchy Vegetables. Baked beans, kidney beans, lima beans, split peas, lentils, potatoes (with skin).   Grains. Whole wheat bread, brown rice, bran flake cereal, plain oatmeal, white rice, shredded wheat, bran muffins.    SEEK IMMEDIATE MEDICAL CARE IF:  You develop increasing pain or severe bloating.   You have an oral temperature above 101F.   You develop vomiting or bowel movements that are bloody or black.   Hemorrhoids Hemorrhoids are dilated (enlarged) veins around the rectum. Sometimes clots will form in the veins. This makes them swollen and painful. These are called thrombosed hemorrhoids. Causes of hemorrhoids include:  Constipation.   Straining to have a bowel movement.   HEAVY LIFTING  HOME CARE INSTRUCTIONS  Eat a well balanced diet and drink 6 to 8 glasses of water every day to avoid constipation. You may also use a bulk laxative.   Avoid straining to have bowel movements.   Keep anal area dry and clean.   Do not use a donut shaped pillow or sit on the toilet for long periods. This increases blood pooling and pain.   Move your bowels when your body has the urge; this will require less straining and will decrease pain and pressure.

## 2016-05-14 NOTE — Interval H&P Note (Signed)
History and Physical Interval Note:  05/14/2016 11:22 AM  [name] [name]  has presented today for surgery, with the diagnosis of RECTAL BLEEDING  The various methods of treatment have been discussed with the patient and family. After consideration of risks, benefits and other options for treatment, the patient has consented to  Procedure(s) with comments: COLONOSCOPY (N/A) - 1000 - moved to 1:30 - office to notify pt as a surgical intervention .  The patient's history has been reviewed, patient examined, no change in status, stable for surgery.  I have reviewed the patient's chart and labs.  Questions were answered to the patient's satisfaction.     [employer]

## 2016-05-14 NOTE — Op Note (Signed)
[facility] Patient Name: [name] [name] Procedure Date: 05/14/2016 12:08 PM MRN: [id number] Date of Birth: [birth year] Attending MD: Barney Drain , MD CSN: [id number] Age: 50 Admit Type: Outpatient Procedure:                Colonoscopy with COLD FORCEPS POLYPECTOMY/ablation                            small polyp VIA CAUTERY Indications:              Hematochezia Providers:                Barney Drain, MD, Janeece Riggers, RN, Purcell Nails.                            Tina Griffiths, Technician Referring MD:             Bronson Curb Medicines:                Promethazine 25 mg IV, Meperidine 100 mg IV,                            Midazolam 6 mg IV Complications:            No immediate complications. Estimated Blood Loss:     Estimated blood loss was minimal. Procedure:                Pre-Anesthesia Assessment:                           - Prior to the procedure, a History and Physical                            was performed, and patient medications and                            allergies were reviewed. The patient's tolerance of                            previous anesthesia was also reviewed. The risks                            and benefits of the procedure and the sedation                            options and risks were discussed with the patient.                            All questions were answered, and informed consent                            was obtained. Prior Anticoagulants: The patient has                            taken no previous anticoagulant or antiplatelet  agents. ASA Grade Assessment: II - A patient with                            mild systemic disease. After reviewing the risks                            and benefits, the patient was deemed in                            satisfactory condition to undergo the procedure.                            After obtaining informed consent, the colonoscope                            was passed under  direct vision. Throughout the                            procedure, the patient's blood pressure, pulse, and                            oxygen saturations were monitored continuously. The                            [id number] scope was introduced through                            the anus and advanced to the 10 cm into the ileum.                            The terminal ileum, ileocecal valve, appendiceal                            orifice, and rectum were photographed. The                            colonoscopy was somewhat difficult due to a                            tortuous colon. Successful completion of the                            procedure was aided by COLOWRAP. The patient                            tolerated the procedure well. The quality of the                            bowel preparation was excellent. Scope In: 12:27:59 PM Scope Out: 12:53:15 PM Scope Withdrawal Time: 0 hours 21 minutes 20 seconds  Total Procedure Duration: 0 hours 25 minutes 16 seconds  Findings:      The terminal ileum appeared normal.      Five sessile polyps were found in the  rectum, recto-sigmoid colon and       ascending colon. The polyps were 2 to 3 mm in size. These polyps were       removed with a cold biopsy forceps. Resection and retrieval were       complete.      Non-bleeding external hemorrhoids were found. The hemorrhoids were large.      Non-bleeding internal hemorrhoids were found. The hemorrhoids were       moderate.      A 2 mm polyp was found in the proximal descending colon. The polyp was       sessile. Coagulation for tissue destruction using snare was successful.      The sigmoid colon and descending colon were moderately redundant. Impression:               - Five 2 to 3 mm polyps in the rectum, at the                            recto-sigmoid colon and in the ascending colon,                            removed with a cold biopsy forceps. Resected and                             retrieved.                           - Non-bleeding external hemorrhoids.                           - RECTAL BLEEDING MOST LIKELY DUE TO Non-bleeding                            internal hemorrhoids.                           - One 2 mm polyp in the proximal descending colon.                            Treated with a hot snare. Moderate Sedation:      Moderate (conscious) sedation was administered by the endoscopy nurse       and supervised by the endoscopist. The following parameters were       monitored: oxygen saturation, heart rate, blood pressure, and response       to care. Total physician intraservice time was 44 minutes. Recommendation:           - High fiber diet.                           - Continue present medications.                           - Await pathology results.                           - Repeat colonoscopy in 5-10 years for surveillance.                           -  Return to my office in 5 months.                           - Patient has a contact number available for                            emergencies. The signs and symptoms of potential                            delayed complications were discussed with the                            patient. Return to normal activities tomorrow.                            Written discharge instructions were provided to the                            patient. Procedure Code(s):        --- Professional ---                           [phone], Colonoscopy, flexible; with ablation of                            tumor(s), polyp(s), or other lesion(s) (includes                            pre- and post-dilation and guide wire passage, when                            performed)                           45380, 59, Colonoscopy, flexible; with biopsy,                            single or multiple                           99152, Moderate sedation services provided by the                            same physician or other qualified  health care                            professional performing the diagnostic or                            therapeutic service that the sedation supports,                            requiring the presence of an independent trained                            observer  to assist in the monitoring of the                            patient's level of consciousness and physiological                            status; initial 15 minutes of intraservice time,                            patient age 56 years or older                           [phone], Moderate sedation services; each additional                            15 minutes intraservice time                           [phone], Moderate sedation services; each additional                            15 minutes intraservice time Diagnosis Code(s):        --- Professional ---                           K64.4, Residual hemorrhoidal skin tags                           K64.8, Other hemorrhoids                           K62.1, Rectal polyp                           D12.7, Benign neoplasm of rectosigmoid junction                           D12.2, Benign neoplasm of ascending colon                           D12.4, Benign neoplasm of descending colon                           K92.1, Melena (includes Hematochezia) CPT copyright 2016 American [employer] Association. All rights reserved. The codes documented in this report are preliminary and upon coder review may  be revised to meet current compliance requirements. Barney Drain, MD Barney Drain, MD 05/14/2016 1:34:54 PM This report has been signed electronically. Number of Addenda: 0

## 2016-05-14 NOTE — H&P (View-Only) (Signed)
   Subjective:    Patient ID: TASHA BUSSIERE, male    DOB: 06/24/1966, 50 y.o.   MRN: [id number]  ROBERTSON, ANTHONY T, PA-C   HPI WAS USING ACETAMINOPHEN MORE REGULARLY. NOT TAKING MEDICINE. HAS QUESTIONS ABOUT CIRRHOSIS. OCCASIONAL RECTAL BLEEDING.  STOMACH FEELS BETTER. HAVING FREQUENT URINATION SINCE JAN 2017. DRINKS WATER ALL THE DAYS. BMs: AT LEAST 2X/DAY.  HEARTBURN WHEN HE EATS RED FOOD. HAS HERNIA AND OCCASIONALLY HAS ABDOMINAL PAIN. Occasional SWELLING IN LEGS. PT DENIES FEVER, CHILLS, HEMATEMESIS, nausea, vomiting, melena, diarrhea, CHEST PAIN, SHORTNESS OF BREATH, CHANGE IN BOWEL IN HABITS, constipation, problems swallowing, OR problems with sedation.   Past Medical History  Diagnosis Date  . Arthritis   . Depression   . Umbilical hernia    Past Surgical History  Procedure Laterality Date  . Foot surgery    . Knee surgery    . Hand surgery Right   . Esophagogastroduodenoscopy N/A 11/01/2015    Procedure: ESOPHAGOGASTRODUODENOSCOPY (EGD);  Surgeon: Danie Binder, MD;  Location: [facility];  Service: Endoscopy;  Laterality: N/A;  245 - moved to 2/1 - office to notify     No Known Allergies   Current Outpatient Prescriptions  Medication Sig Dispense Refill  . NONE     .      Marland Kitchen      .        Review of Systems PER HPI OTHERWISE ALL SYSTEMS ARE NEGATIVE.    Objective:   Physical Exam  Constitutional: He is oriented to person, place, and time. He appears well-developed and well-nourished. No distress.  HENT:  Head: Normocephalic and atraumatic.  Mouth/Throat: Oropharynx is clear and moist. No oropharyngeal exudate.  Eyes: Pupils are equal, round, and reactive to light. No scleral icterus.  Neck: Normal range of motion. Neck supple.  Cardiovascular: Normal rate, regular rhythm and normal heart sounds.   Pulmonary/Chest: Effort normal and breath sounds normal. No respiratory distress.  Abdominal: Soft. Bowel sounds are normal. He exhibits no distension. There  is no tenderness.  Musculoskeletal: He exhibits no edema.  Lymphadenopathy:    He has no cervical adenopathy.  Neurological: He is alert and oriented to person, place, and time.  NO FOCAL DEFICITS  Psychiatric:  SLIGHTLY ANXIOUS MOOD, NL AFFECT  Vitals reviewed.     Assessment & Plan:

## 2016-05-20 ENCOUNTER — Encounter (HOSPITAL_COMMUNITY): Payer: Self-pay | Admitting: Gastroenterology

## 2016-05-21 ENCOUNTER — Telehealth: Payer: Self-pay | Admitting: Gastroenterology

## 2016-05-21 NOTE — Telephone Encounter (Signed)
Please call pt. HE had ONE simple adenoma AND FOUR HYPERPLASTIC POLYPS removed  NEXT COLONOSCOPY IN 5-10 YEARS.

## 2016-05-22 NOTE — Telephone Encounter (Signed)
Pt is aware.  

## 2016-07-09 ENCOUNTER — Ambulatory Visit: Payer: Medicare Other | Admitting: Gastroenterology

## 2016-09-10 ENCOUNTER — Telehealth: Payer: Self-pay | Admitting: Gastroenterology

## 2016-09-10 ENCOUNTER — Encounter: Payer: Self-pay | Admitting: Gastroenterology

## 2016-09-10 NOTE — Telephone Encounter (Signed)
Recall for ultrasound 

## 2016-09-10 NOTE — Telephone Encounter (Signed)
Letter mailed

## 2016-10-09 IMAGING — US US ABDOMEN LIMITED
1 series · 14 of 25 positions shown · non-contrast
Comparison: [DATE] abdominal CT

CLINICAL DATA: Cirrhosis.  Screening.

EXAM:
US ABDOMEN LIMITED - RIGHT UPPER QUADRANT

[Series 1: us abdomen limited · 0.25mm/px · 14 of 77 slices shown]
[im 1/77]
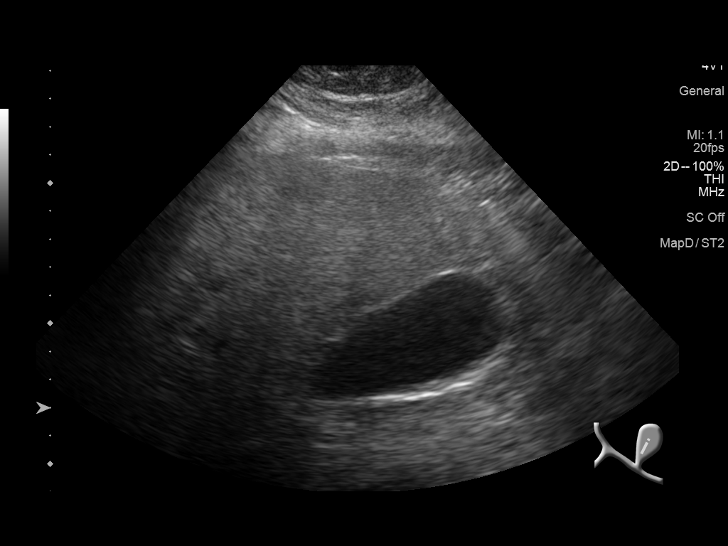
[im 7/77]
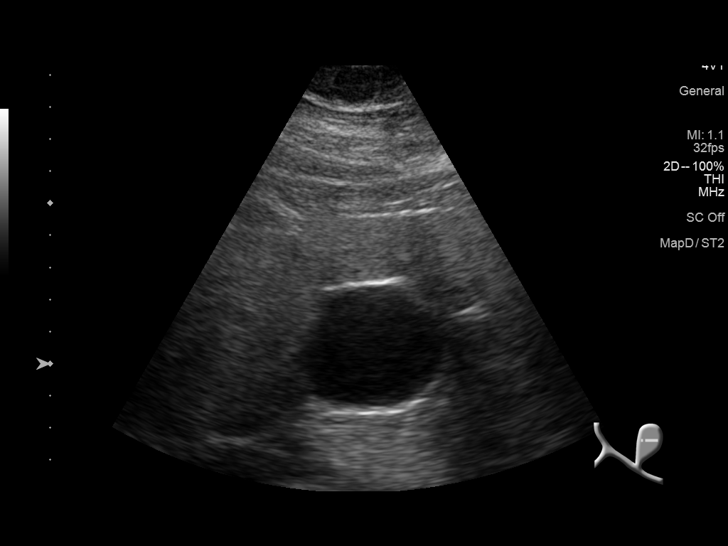
[im 13/77]
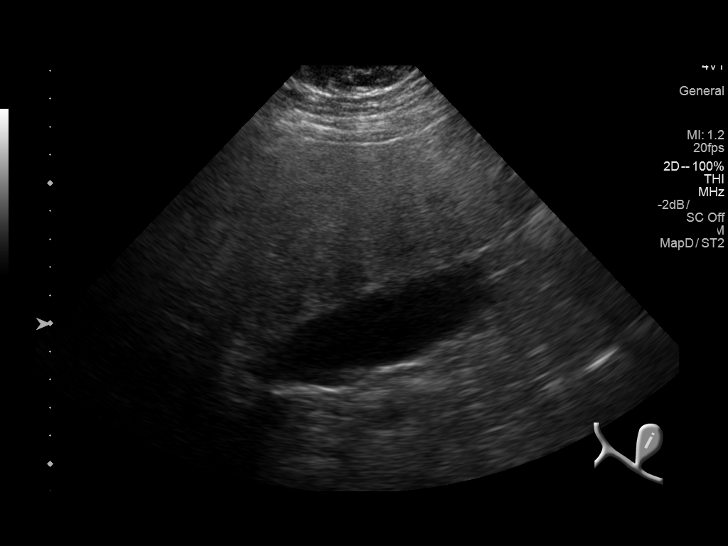
[im 20/77]
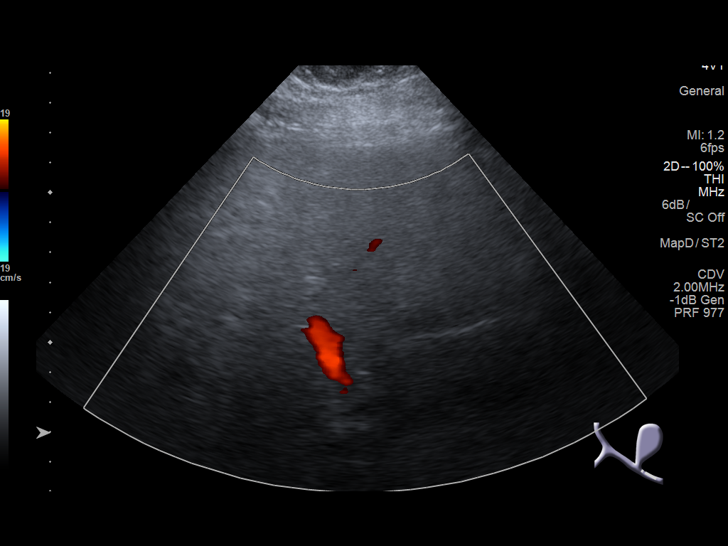
[im 26/77]
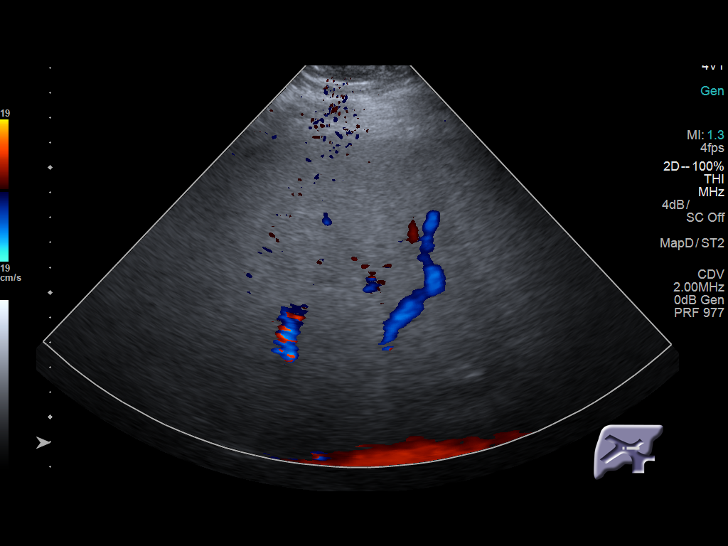
[im 29/77]
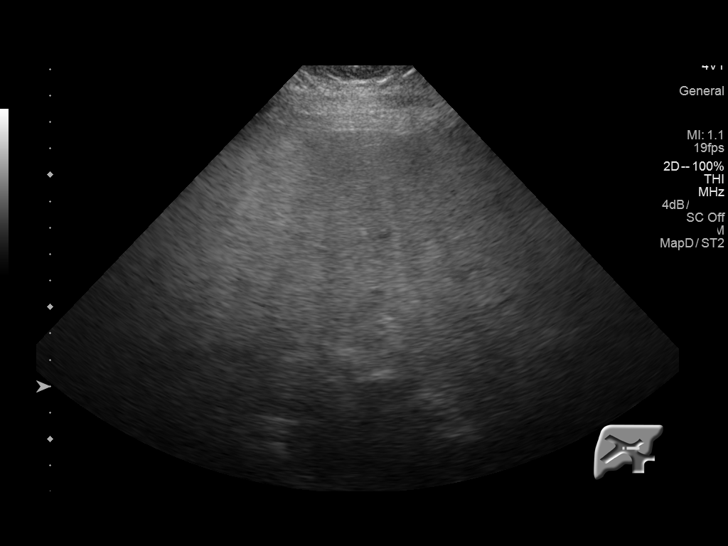
[im 35/77]
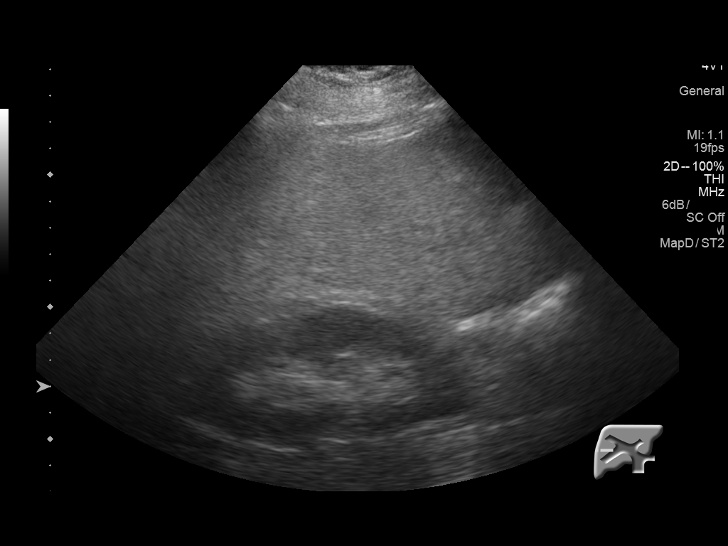
[im 42/77]
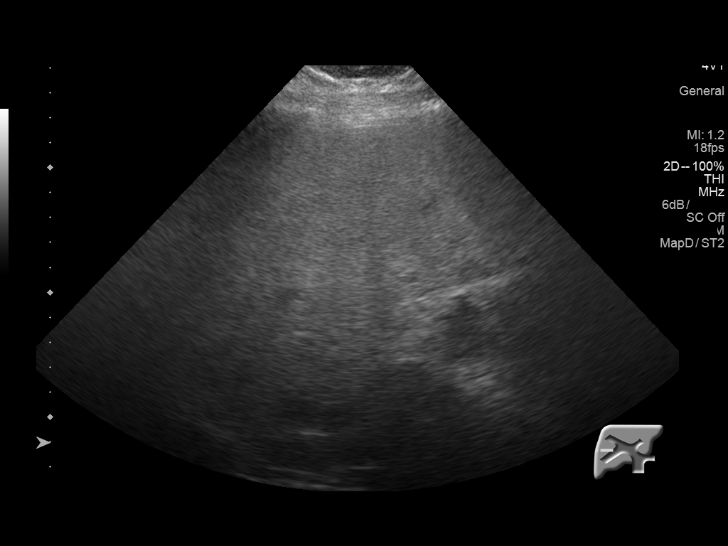
[im 48/77]
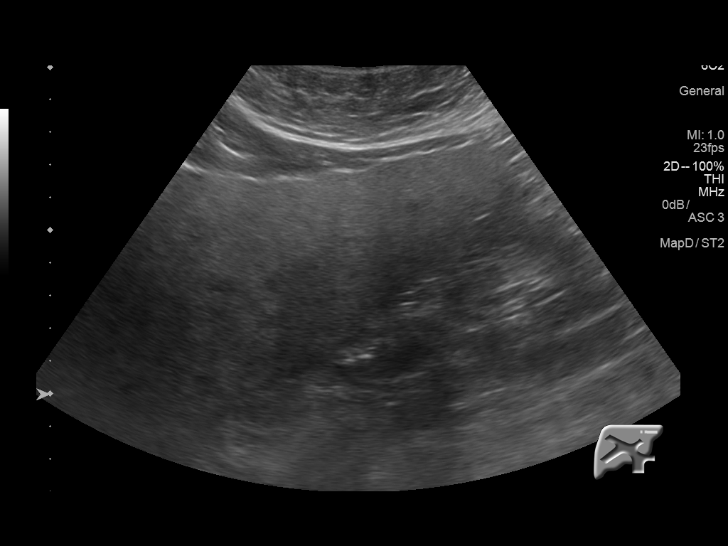
[im 51/77]
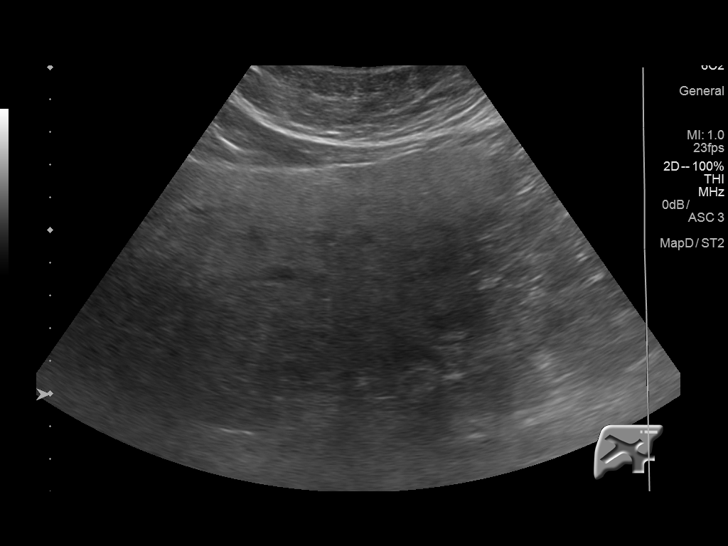
[im 58/77]
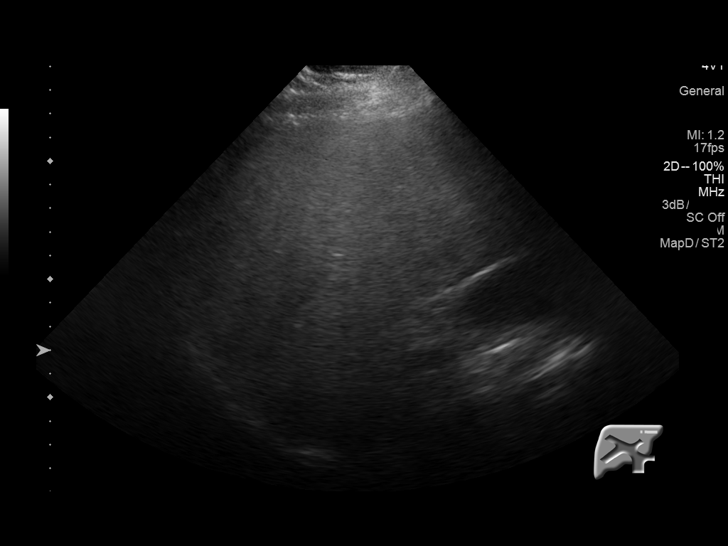
[im 64/77]
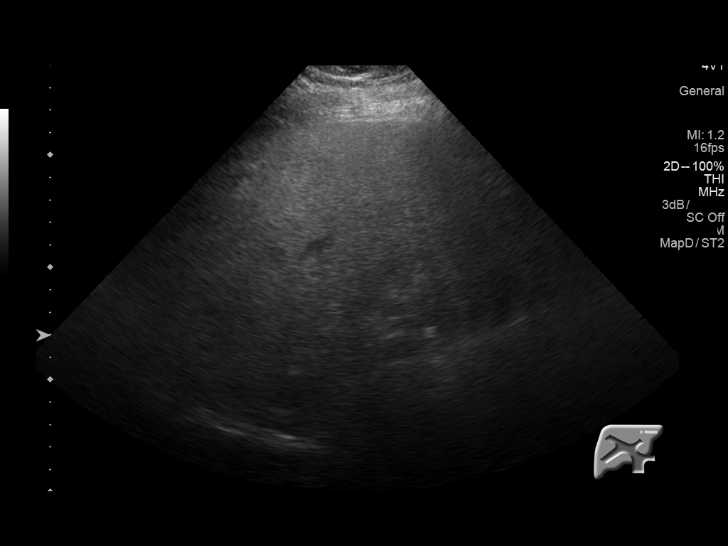
[im 70/77]
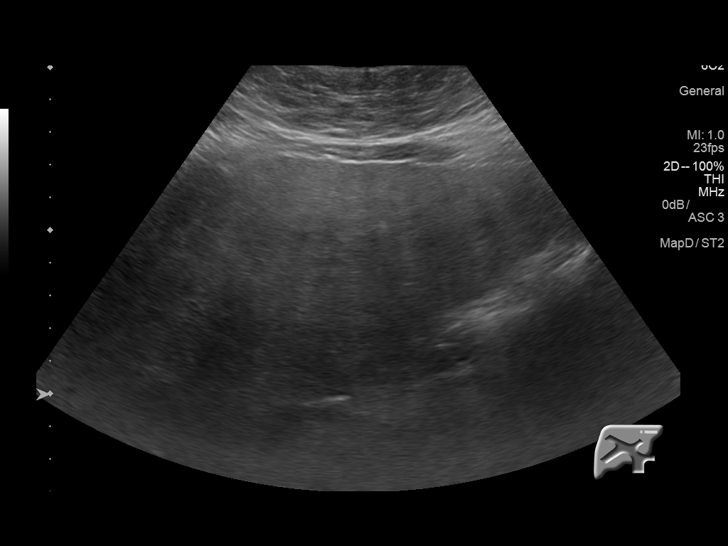
[im 77/77]
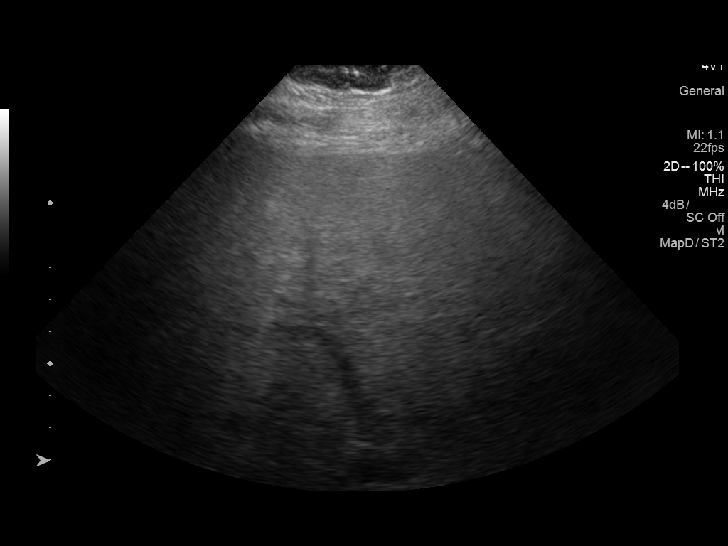

[14 of 25 positions shown; findings below may reference images not displayed]

FINDINGS: Gallbladder:

No gallstones or wall thickening visualized. No sonographic Murphy
sign noted by sonographer.

Common bile duct:

Diameter: 5 mm

Liver:

Cirrhotic liver with heterogeneous echotexture and surface
nodularity. There is also steatosis, confirmed on previous CT, with
poor acoustic penetration. No mass is seen. Antegrade flow in the
imaged portal venous system.
IMPRESSION: Cirrhosis without visible mass. Superimposed hepatic steatosis
limits sonographic sensitivity. Antegrade flow in the main portal
vein; no ascites.

## 2016-10-28 ENCOUNTER — Telehealth: Payer: Self-pay

## 2016-10-28 NOTE — Telephone Encounter (Signed)
Too late for Tamilflu if this is day three. Agree with recommendations provided.

## 2016-10-28 NOTE — Telephone Encounter (Signed)
Pt called and said he is having flu like symptoms, this is day 3.  H does not have a PCP at this time. He has a cough that is productive and he has spit up gobs of dark brown mucous a couple of times. Headache sometimes.  He has had a fever of 102.1 but it is now down to 100.6. He wants to know what he can take since he has liver problems. I spoke with Neil Crouch, PA in Dr. Nona Dell absence. She said he can take Ibuprofen 400 mg every 8 hours and if that doesn't help, he can alternate with Tylenol 500 mg but no more than 4 tablets of 500 mg daily.  I advised ( per Magda Paganini) that if symptoms worsen or do not improve he should seek medical attention.  Pt is aware of all.

## 2016-11-01 DIAGNOSIS — K0889 Other specified disorders of teeth and supporting structures: Secondary | ICD-10-CM | POA: Diagnosis not present

## 2016-11-01 DIAGNOSIS — K7469 Other cirrhosis of liver: Secondary | ICD-10-CM | POA: Diagnosis not present

## 2016-11-01 DIAGNOSIS — D369 Benign neoplasm, unspecified site: Secondary | ICD-10-CM | POA: Diagnosis not present

## 2016-11-07 ENCOUNTER — Encounter: Payer: Self-pay | Admitting: Gastroenterology

## 2016-11-07 ENCOUNTER — Ambulatory Visit (INDEPENDENT_AMBULATORY_CARE_PROVIDER_SITE_OTHER): Payer: Medicare HMO | Admitting: Gastroenterology

## 2016-11-07 DIAGNOSIS — K7460 Unspecified cirrhosis of liver: Secondary | ICD-10-CM

## 2016-11-07 DIAGNOSIS — D696 Thrombocytopenia, unspecified: Secondary | ICD-10-CM

## 2016-11-07 NOTE — Assessment & Plan Note (Signed)
WELL COMPENSATED DISEASE.  CONTINUE YOUR WEIGHT LOSS EFFORTS. DRINK WATER TO KEEP YOUR URINE LIGHT YELLOW. FOLLOW A HIGH FIBER/LOW FAT DIET. MEATS SHOULD BE BAKED, BROILED, OR BOILED. AVOID FRIED FOODS. COMPLETE HFP/PT/INR/CBC AND RUQ ULTRASOUND.  FOLLOW UP IN 6 MOS.

## 2016-11-07 NOTE — Progress Notes (Signed)
ON RECALL  °

## 2016-11-07 NOTE — Progress Notes (Signed)
cc'ed to pcp °

## 2016-11-07 NOTE — Progress Notes (Signed)
   Subjective:    Patient ID: DAUGHTRY DOBEK, male    DOB: 08/25/1966, 51 y.o.   MRN: [id number] Jani Gravel, MD  HPI OCCASIONAL left foot pain. Feels pretty good. HAD FLU A COUPLE WEEKS AGO. RODE Rankin FLU. HAD BAD CASE OF SINUS INFECTION AFTERWARDS. DIARRHEA AFTER [place] SOMETIMES. OCCASIONAL TENDERNESS AT [place] WHEN HE LAYS ON HIS STOMACH. WEIGHT DOWN: 291 LBS --> 285 LBS.  PT DENIES FEVER, CHILLS, HEMATOCHEZIA, nausea, vomiting, melena, CHEST PAIN, SHORTNESS OF BREATH,  CHANGE IN BOWEL IN HABITS, constipation, problems swallowing, OR heartburn or indigestion.  Past Medical History:  Diagnosis Date  . Arthritis   . Depression   . Umbilical hernia    Past Surgical History:  Procedure Laterality Date  . COLONOSCOPY N/A 05/14/2016   Procedure: COLONOSCOPY;  Surgeon: Danie Binder, MD;  [facility];  Service: Endoscopy;  Laterality: N/A;  1000 - moved to 1:30 - office to notify pt  . ESOPHAGOGASTRODUODENOSCOPY N/A 11/01/2015   Procedure: ESOPHAGOGASTRODUODENOSCOPY (EGD);  Surgeon: Danie Binder, MD;  [facility]: [facility];  Service: Endoscopy;  Laterality: N/A;  245 - moved to 2/1 - office to notify   . FOOT SURGERY    . HAND SURGERY Right   . KNEE SURGERY     No Known Allergies  MEDS: SINUS MEDICINE(2 MORE DAYS)  Family History  Problem Relation Age of Onset  . Colon cancer Neg Hx   . Colon polyps Neg Hx   . Stomach cancer Neg Hx   . Pancreatic cancer Neg Hx    Review of Systems PER HPI OTHERWISE ALL SYSTEMS ARE NEGATIVE.    Objective:   Physical Exam  Constitutional: He is oriented to person, place, and time. He appears well-developed and well-nourished. No distress.  HENT:  Head: Normocephalic and atraumatic.  Mouth/Throat: Oropharynx is clear and moist. No oropharyngeal exudate.  Eyes: Pupils are equal, round, and reactive to light. No scleral icterus.  Neck: Normal range of motion. Neck supple.  Cardiovascular: Normal rate, regular rhythm and  normal heart sounds.   Pulmonary/Chest: Effort normal and breath sounds normal. No respiratory distress.  Abdominal: Soft. Bowel sounds are normal. He exhibits no distension. There is no tenderness.  Musculoskeletal: He exhibits no edema.  Lymphadenopathy:    He has no cervical adenopathy.  Neurological: He is alert and oriented to person, place, and time.  Psychiatric: He has a normal mood and affect.  Vitals reviewed.     Assessment & Plan:

## 2016-11-07 NOTE — Patient Instructions (Addendum)
CONTINUE YOUR WEIGHT LOSS EFFORTS.  DRINK WATER TO KEEP YOUR URINE LIGHT YELLOW.  FOLLOW A HIGH FIBER/LOW FAT DIET. MEATS SHOULD BE BAKED, BROILED, OR BOILED. AVOID FRIED FOODS.  COMPLETE LABS AND ULTRASOUND. YOU SHOULD NOT EAT OR DRINK ANYTHING 8 HOURS PRIOR TO HAVING YOUR LABS DRAWN.  FOLLOW UP IN 6 MOS.

## 2016-11-07 NOTE — Assessment & Plan Note (Signed)
NO BRBPR OR MELENA.  CONTINUE TO MONITOR SYMPTOMS. NEEDS CBC.

## 2016-11-12 ENCOUNTER — Ambulatory Visit (HOSPITAL_COMMUNITY)
Admission: RE | Admit: 2016-11-12 | Discharge: 2016-11-12 | Disposition: A | Discharge status: Home / Self Care | Payer: Medicare HMO | Source: Ambulatory Visit | Attending: Gastroenterology | Admitting: Gastroenterology

## 2016-11-12 DIAGNOSIS — Z125 Encounter for screening for malignant neoplasm of prostate: Secondary | ICD-10-CM | POA: Diagnosis not present

## 2016-11-12 DIAGNOSIS — K7460 Unspecified cirrhosis of liver: Secondary | ICD-10-CM | POA: Insufficient documentation

## 2016-11-12 DIAGNOSIS — Z0000 Encounter for general adult medical examination without abnormal findings: Secondary | ICD-10-CM | POA: Diagnosis not present

## 2016-11-13 ENCOUNTER — Telehealth: Payer: Self-pay

## 2016-11-13 LAB — CBC WITH DIFFERENTIAL/PLATELET
BASOS ABS: 0.1 10*3/uL (ref 0.0–0.2)
Basos: 1 %
EOS (ABSOLUTE): 0.1 10*3/uL (ref 0.0–0.4)
Eos: 2 %
HEMATOCRIT: 44.8 % (ref 37.5–51.0)
Hemoglobin: 15.2 g/dL (ref 13.0–17.7)
Immature Grans (Abs): 0 10*3/uL (ref 0.0–0.1)
Immature Granulocytes: 0 %
LYMPHS ABS: 1.3 10*3/uL (ref 0.7–3.1)
Lymphs: 32 %
MCH: 31.1 pg (ref 26.6–33.0)
MCHC: 33.9 g/dL (ref 31.5–35.7)
MCV: 92 fL (ref 79–97)
MONOS ABS: 0.4 10*3/uL (ref 0.1–0.9)
Monocytes: 8 %
Neutrophils Absolute: 2.4 10*3/uL (ref 1.4–7.0)
Neutrophils: 57 %
Platelets: 108 10*3/uL — ABNORMAL LOW (ref 150–379)
RBC: 4.89 x10E6/uL (ref 4.14–5.80)
RDW: 13.1 % (ref 12.3–15.4)
WBC: 4.2 10*3/uL (ref 3.4–10.8)

## 2016-11-13 LAB — PROTIME-INR
INR: 1.1 (ref 0.8–1.2)
Prothrombin Time: 11.5 s (ref 9.1–12.0)

## 2016-11-13 LAB — ALPHA-1-ANTITRYPSIN: A1 ANTITRYPSIN: 145 mg/dL (ref 90–200)

## 2016-11-13 LAB — CERULOPLASMIN: Ceruloplasmin: 19.8 mg/dL (ref 16.0–31.0)

## 2016-11-13 LAB — TRANSFERRIN: Transferrin: 279 mg/dL (ref 200–370)

## 2016-11-13 NOTE — Telephone Encounter (Signed)
Pt returned call and is aware of the [place] results.

## 2016-11-14 NOTE — Progress Notes (Signed)
LM for pt to returncall

## 2016-11-18 NOTE — Progress Notes (Signed)
PT is aware of results.  

## 2017-01-04 ENCOUNTER — Encounter (HOSPITAL_COMMUNITY): Payer: Self-pay | Admitting: Emergency Medicine

## 2017-01-04 ENCOUNTER — Emergency Department (HOSPITAL_COMMUNITY)
Admission: EM | Admit: 2017-01-04 | Discharge: 2017-01-04 | Disposition: A | Discharge status: Home / Self Care | Payer: Medicare HMO | Attending: Emergency Medicine | Admitting: Emergency Medicine

## 2017-01-04 DIAGNOSIS — M779 Enthesopathy, unspecified: Secondary | ICD-10-CM | POA: Insufficient documentation

## 2017-01-04 DIAGNOSIS — M79631 Pain in right forearm: Secondary | ICD-10-CM | POA: Diagnosis present

## 2017-01-04 DIAGNOSIS — Z87891 Personal history of nicotine dependence: Secondary | ICD-10-CM | POA: Diagnosis not present

## 2017-01-04 HISTORY — DX: Unspecified cirrhosis of liver: K74.60

## 2017-01-04 MED ORDER — OXYCODONE-ACETAMINOPHEN 5-325 MG PO TABS: 1.0000 | ORAL_TABLET | ORAL | 0 refills | Status: DC | PRN

## 2017-01-04 MED ORDER — PREDNISONE 10 MG PO TABS: 20.0000 mg | ORAL_TABLET | Freq: Every day | ORAL | 0 refills | Status: DC

## 2017-01-04 NOTE — ED Provider Notes (Signed)
[facility] DEPT Provider Note   CSN: [id number] Arrival date & time: 01/04/17  1138     History   Chief Complaint Chief Complaint  Patient presents with  . Arm Injury    HPI [name] [name] is a 51 y.o. male.  Patient complains of pain in the proximal lateral right forearm for several months. He has been doing excessive amount of repetition movement secondary to hammering and painting.  He also complains of pain in his mid right lower back secondary to pulling on a lawnmower engine starter rope. Severity is moderate.      Past Medical History:  Diagnosis Date  . Arthritis   . Cirrhosis ([place])   . Depression   . Umbilical hernia     Patient Active Problem List   Diagnosis Date Noted  . 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 Dyspepsia   . Thrombocytopenia ([place]) 10/19/2015    Past Surgical History:  Procedure Laterality Date  . COLONOSCOPY N/A 05/14/2016   Procedure: COLONOSCOPY;  Surgeon: Danie Binder, MD;  [facility];  Service: Endoscopy;  Laterality: N/A;  1000 - moved to 1:30 - office to notify pt  . ESOPHAGOGASTRODUODENOSCOPY N/A 11/01/2015   Procedure: ESOPHAGOGASTRODUODENOSCOPY (EGD);  Surgeon: Danie Binder, MD;  [facility];  Service: Endoscopy;  Laterality: N/A;  245 - moved to 2/1 - office to notify   . FOOT SURGERY    . HAND SURGERY Right   . KNEE SURGERY         Home Medications    Prior to Admission medications   Medication Sig Start Date End Date Taking? Authorizing Provider  oxyCODONE-acetaminophen (PERCOCET) 5-325 MG tablet Take 1-2 tablets by mouth every 4 (four) hours as needed. 01/04/17   Nat Christen, MD  predniSONE (DELTASONE) 10 MG tablet Take 2 tablets (20 mg total) by mouth daily. 01/04/17   Nat Christen, MD    Family History Family History  Problem Relation Age of Onset  . Colon cancer Neg Hx   . Colon polyps Neg Hx   . Stomach cancer Neg Hx   . Pancreatic cancer Neg Hx     Social  History Social History  Substance Use Topics  . Smoking status: Former Smoker    Types: Cigarettes  . Smokeless tobacco: Never Used  . Alcohol use No     Allergies   Patient has no known allergies.   Review of Systems Review of Systems  All other systems reviewed and are negative.    Physical Exam Updated Vital Signs Ht 5\' 11"  (1.803 m)   Wt 280 lb (127 kg)   BMI 39.05 kg/m   Physical Exam  Constitutional: He is oriented to person, place, and time. He appears well-developed and well-nourished.  HENT:  Head: Normocephalic and atraumatic.  Eyes: Conjunctivae are normal.  Neck: Neck supple.  Cardiovascular: Normal rate and regular rhythm.   Pulmonary/Chest: Effort normal and breath sounds normal.  Abdominal: Soft. Bowel sounds are normal.  Musculoskeletal:  Right forearm: Tender in the proximal lateral aspect of the forearm.  Back exam: Skinner tenderness mid right back  Neurological: He is alert and oriented to person, place, and time.  Skin: Skin is warm and dry.  Psychiatric: He has a normal mood and affect. His behavior is normal.  Nursing note and vitals reviewed.    ED Treatments / Results  Labs (all labs ordered are listed, but only abnormal results are displayed) Labs Reviewed -  No data to display  EKG  EKG Interpretation None       Radiology No results found.  Procedures Procedures (including critical care time)  Medications Ordered in ED Medications - No data to display   Initial Impression / Assessment and Plan / ED Course  I have reviewed the triage vital signs and the nursing notes.  Pertinent labs & imaging results that were available during my care of the patient were reviewed by me and considered in my medical decision making (see chart for details).   Patient has a tendinitis or repetitive motion. Will Rx prednisone and Percocet. Referral to hand surgeon for physical therapy    Final Clinical Impressions(s) / ED Diagnoses    Final diagnoses:  Tendonitis    New Prescriptions New Prescriptions   OXYCODONE-ACETAMINOPHEN (PERCOCET) 5-325 MG TABLET    Take 1-2 tablets by mouth every 4 (four) hours as needed.   PREDNISONE (DELTASONE) 10 MG TABLET    Take 2 tablets (20 mg total) by mouth daily.     Nat Christen, MD 01/04/17 1246

## 2017-01-04 NOTE — Discharge Instructions (Signed)
Ice pack to forearm. Prescription for pain medicine and prednisone.  Recommend follow-up with hand surgeon if symptoms not improving. Phone number given.

## 2017-01-04 NOTE — ED Triage Notes (Signed)
Pt reports right arm/elbow pain.  States he was unable to get out of bed without assistance this morning due to pain.  Denies injury, began on Thursday.

## 2017-01-21 DIAGNOSIS — Z0001 Encounter for general adult medical examination with abnormal findings: Secondary | ICD-10-CM | POA: Diagnosis not present

## 2017-01-21 DIAGNOSIS — R739 Hyperglycemia, unspecified: Secondary | ICD-10-CM | POA: Diagnosis not present

## 2017-01-21 DIAGNOSIS — R52 Pain, unspecified: Secondary | ICD-10-CM | POA: Diagnosis not present

## 2017-01-21 DIAGNOSIS — K760 Fatty (change of) liver, not elsewhere classified: Secondary | ICD-10-CM | POA: Diagnosis not present

## 2017-01-23 DIAGNOSIS — M25521 Pain in right elbow: Secondary | ICD-10-CM | POA: Diagnosis not present

## 2017-01-23 DIAGNOSIS — M7711 Lateral epicondylitis, right elbow: Secondary | ICD-10-CM | POA: Insufficient documentation

## 2017-03-04 DIAGNOSIS — M25521 Pain in right elbow: Secondary | ICD-10-CM | POA: Diagnosis not present

## 2017-03-04 DIAGNOSIS — M7711 Lateral epicondylitis, right elbow: Secondary | ICD-10-CM | POA: Diagnosis not present

## 2017-03-26 ENCOUNTER — Encounter: Payer: Self-pay | Admitting: Gastroenterology

## 2017-04-25 DIAGNOSIS — K0889 Other specified disorders of teeth and supporting structures: Secondary | ICD-10-CM | POA: Diagnosis not present

## 2017-04-25 DIAGNOSIS — K7469 Other cirrhosis of liver: Secondary | ICD-10-CM | POA: Diagnosis not present

## 2017-04-25 DIAGNOSIS — D369 Benign neoplasm, unspecified site: Secondary | ICD-10-CM | POA: Diagnosis not present

## 2017-04-29 DIAGNOSIS — K7469 Other cirrhosis of liver: Secondary | ICD-10-CM | POA: Diagnosis not present

## 2017-04-29 DIAGNOSIS — E785 Hyperlipidemia, unspecified: Secondary | ICD-10-CM | POA: Diagnosis not present

## 2017-04-29 DIAGNOSIS — K760 Fatty (change of) liver, not elsewhere classified: Secondary | ICD-10-CM | POA: Diagnosis not present

## 2017-04-29 DIAGNOSIS — E119 Type 2 diabetes mellitus without complications: Secondary | ICD-10-CM | POA: Diagnosis not present

## 2017-05-19 IMAGING — US US ABDOMEN LIMITED
1 series · 14 of 25 positions shown · non-contrast
Comparison: [DATE].

CLINICAL DATA: Cirrhosis.

EXAM:
US ABDOMEN LIMITED - RIGHT UPPER QUADRANT

[Series 1: us abdomen limited · 0.25mm/px · 14 of 55 slices shown]
[im 1/55]
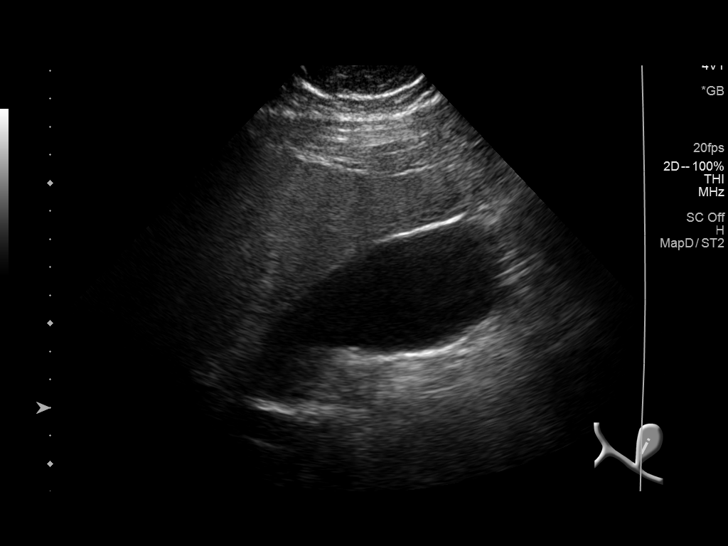
[im 5/55]
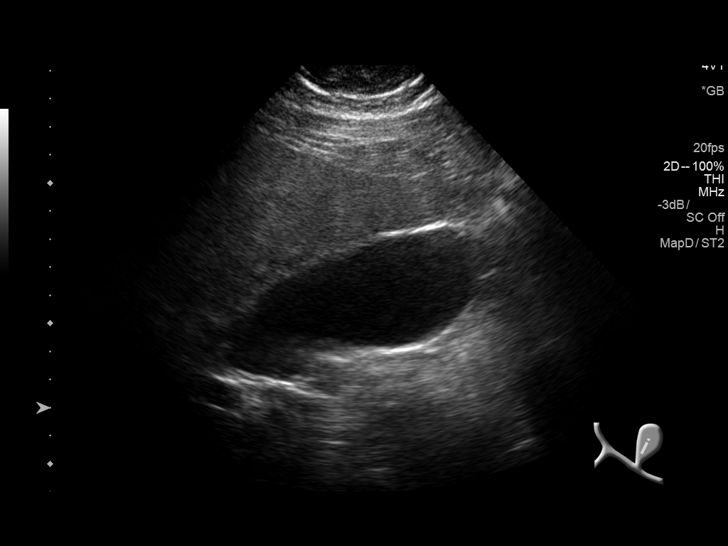
[im 10/55]
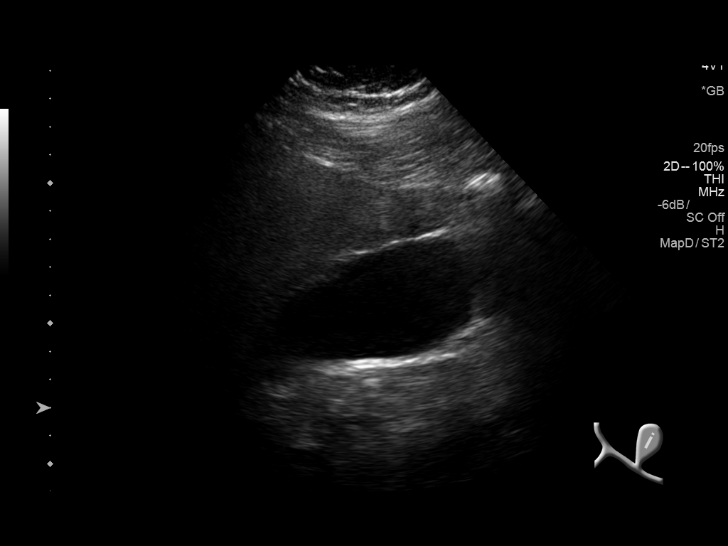
[im 14/55]
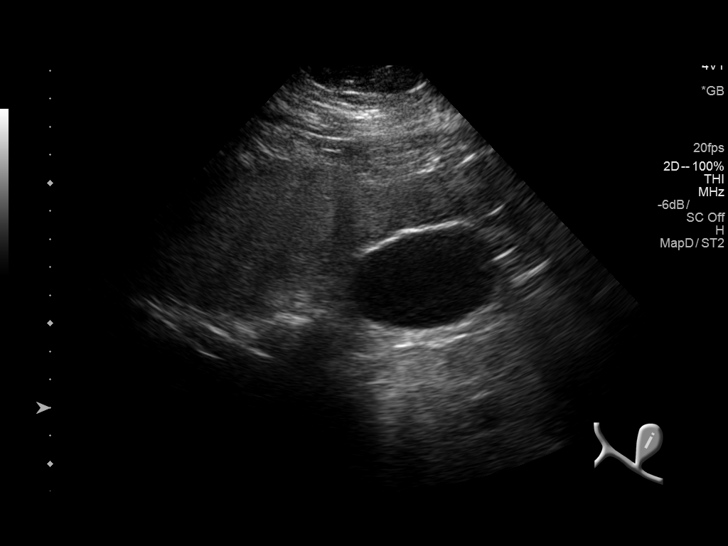
[im 19/55]
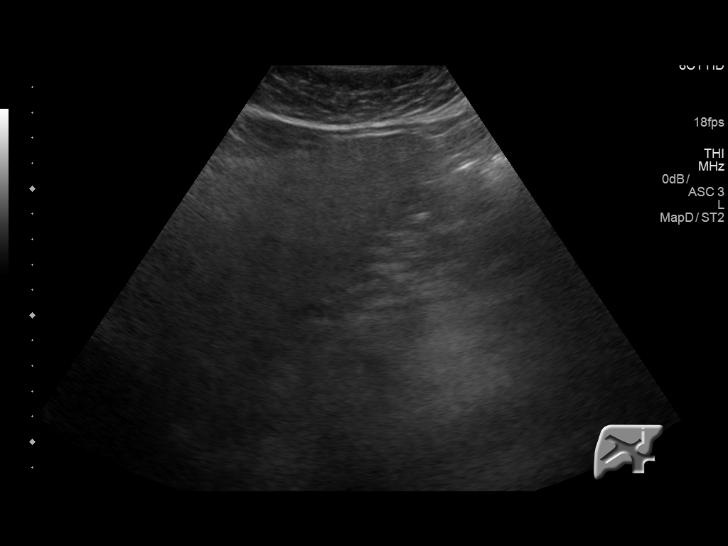
[im 21/55]
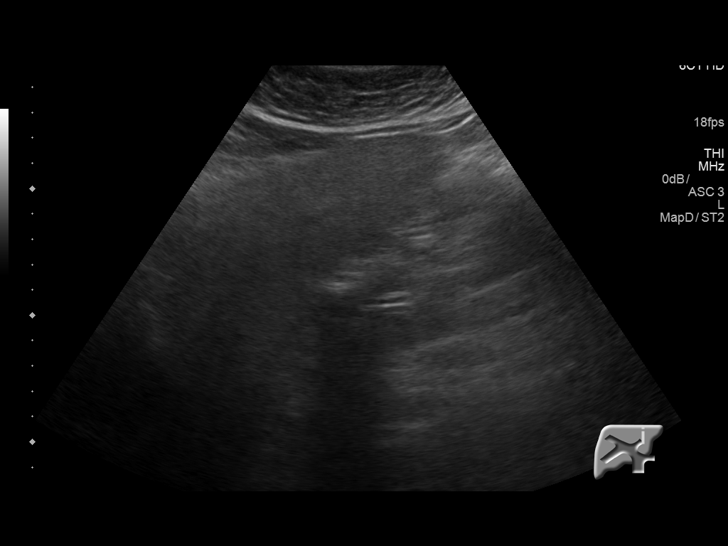
[im 25/55]
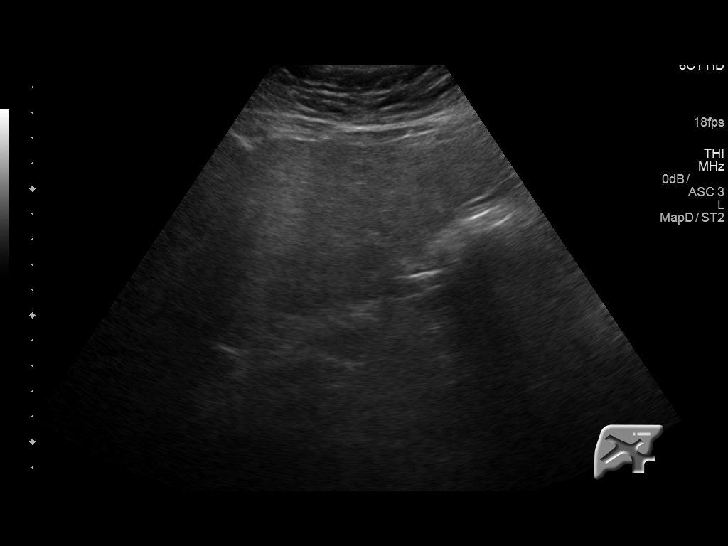
[im 30/55]
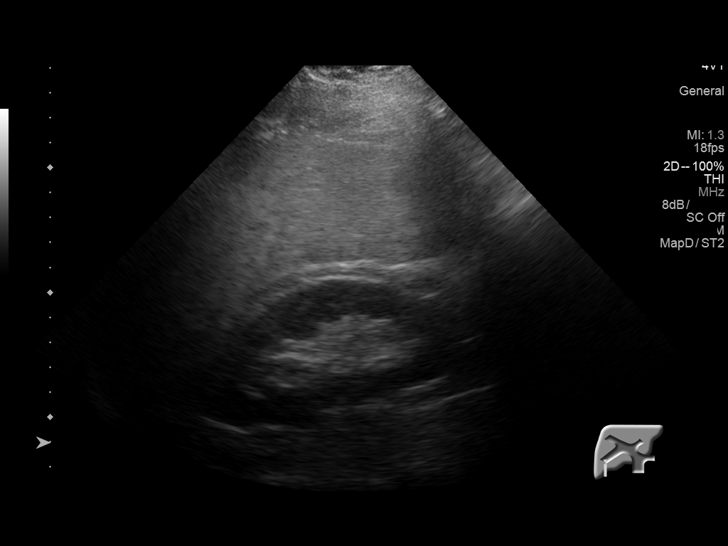
[im 34/55]
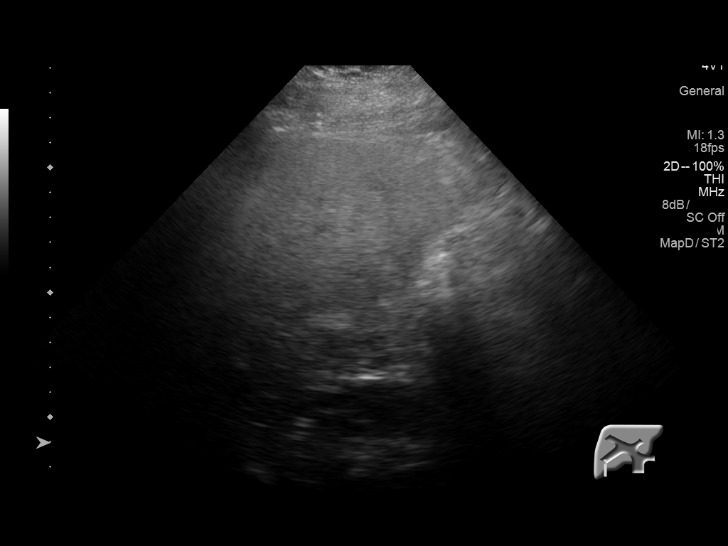
[im 37/55]
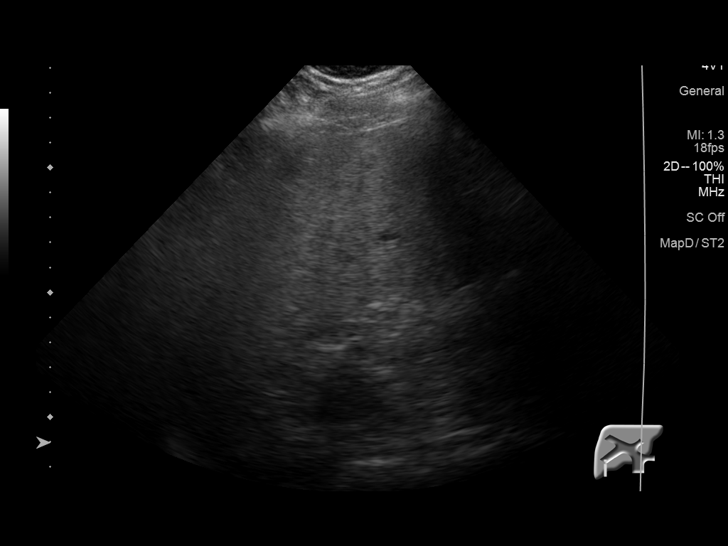
[im 41/55]
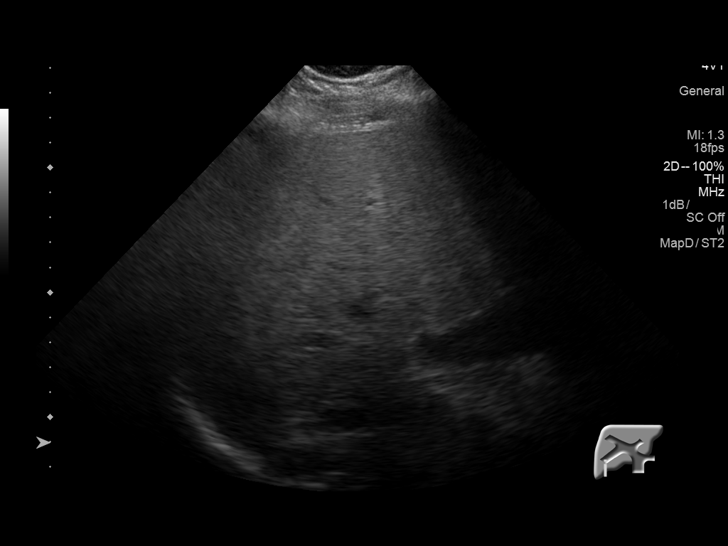
[im 46/55]
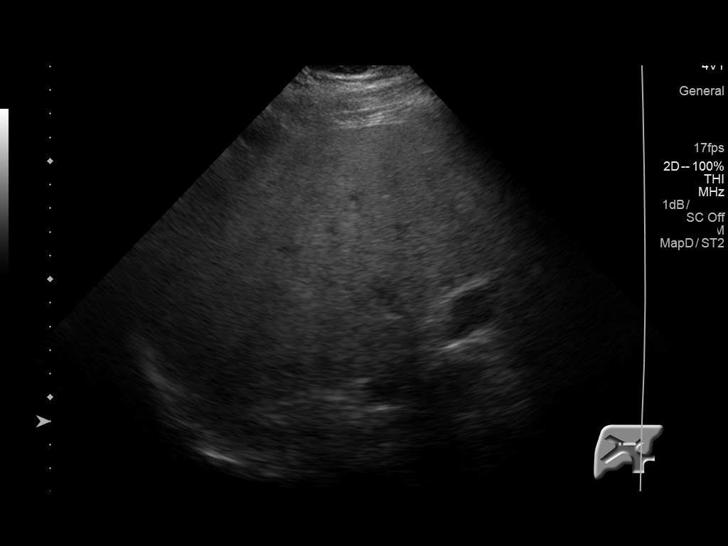
[im 50/55]
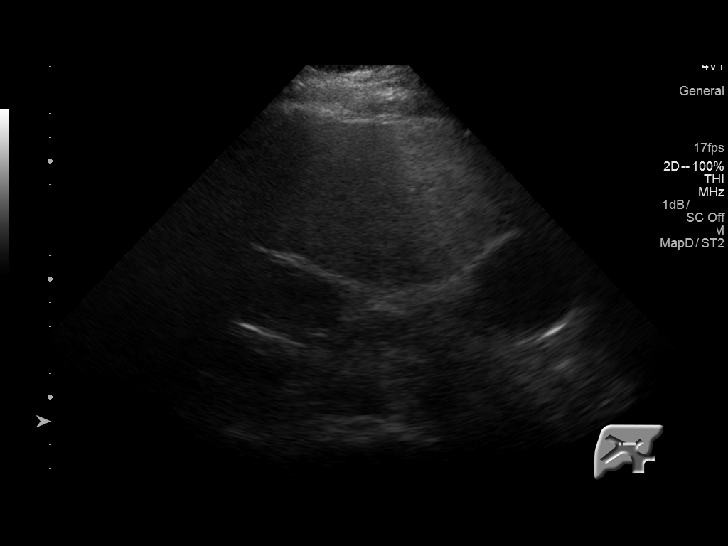
[im 55/55]
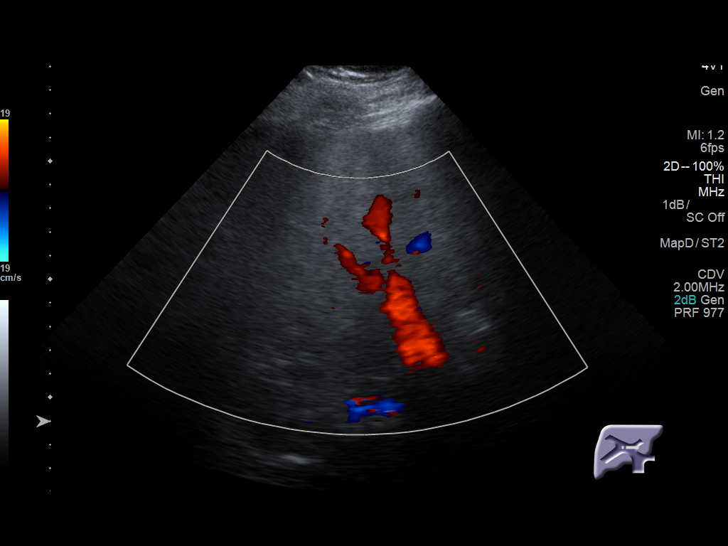

[14 of 25 positions shown; findings below may reference images not displayed]

FINDINGS: Gallbladder:

No gallstones or wall thickening visualized. No sonographic Murphy
sign noted by sonographer.

Common bile duct:

Diameter: 3.9 mm

Liver:

The liver is echogenic consistent fatty infiltration and/or
hepatocellular disease. No focal hepatic abnormality identified.
IMPRESSION: Liver is echogenic consistent fatty infiltration and/or
hepatocellular disease. No acute or focal abnormality identified. No
gallstones identified.

## 2017-06-24 DIAGNOSIS — E119 Type 2 diabetes mellitus without complications: Secondary | ICD-10-CM | POA: Diagnosis not present

## 2017-06-24 DIAGNOSIS — L720 Epidermal cyst: Secondary | ICD-10-CM | POA: Diagnosis not present

## 2017-06-24 DIAGNOSIS — J309 Allergic rhinitis, unspecified: Secondary | ICD-10-CM | POA: Diagnosis not present

## 2017-06-24 DIAGNOSIS — D235 Other benign neoplasm of skin of trunk: Secondary | ICD-10-CM | POA: Diagnosis not present

## 2017-07-28 DIAGNOSIS — K7689 Other specified diseases of liver: Secondary | ICD-10-CM | POA: Diagnosis not present

## 2017-07-31 DIAGNOSIS — E785 Hyperlipidemia, unspecified: Secondary | ICD-10-CM | POA: Diagnosis not present

## 2017-07-31 DIAGNOSIS — K760 Fatty (change of) liver, not elsewhere classified: Secondary | ICD-10-CM | POA: Diagnosis not present

## 2017-07-31 DIAGNOSIS — Z6839 Body mass index (BMI) 39.0-39.9, adult: Secondary | ICD-10-CM | POA: Diagnosis not present

## 2017-07-31 DIAGNOSIS — E119 Type 2 diabetes mellitus without complications: Secondary | ICD-10-CM | POA: Diagnosis not present

## 2017-07-31 DIAGNOSIS — E6609 Other obesity due to excess calories: Secondary | ICD-10-CM | POA: Diagnosis not present

## 2017-07-31 DIAGNOSIS — Z23 Encounter for immunization: Secondary | ICD-10-CM | POA: Diagnosis not present

## 2017-09-01 DIAGNOSIS — E785 Hyperlipidemia, unspecified: Secondary | ICD-10-CM | POA: Diagnosis not present

## 2017-09-01 DIAGNOSIS — K760 Fatty (change of) liver, not elsewhere classified: Secondary | ICD-10-CM | POA: Diagnosis not present

## 2017-09-01 DIAGNOSIS — E119 Type 2 diabetes mellitus without complications: Secondary | ICD-10-CM | POA: Diagnosis not present

## 2017-09-11 DIAGNOSIS — E119 Type 2 diabetes mellitus without complications: Secondary | ICD-10-CM | POA: Diagnosis not present

## 2017-09-11 DIAGNOSIS — K760 Fatty (change of) liver, not elsewhere classified: Secondary | ICD-10-CM | POA: Diagnosis not present

## 2017-09-11 DIAGNOSIS — Z6841 Body Mass Index (BMI) 40.0 and over, adult: Secondary | ICD-10-CM | POA: Diagnosis not present

## 2017-09-11 DIAGNOSIS — E785 Hyperlipidemia, unspecified: Secondary | ICD-10-CM | POA: Diagnosis not present

## 2017-12-05 DIAGNOSIS — E119 Type 2 diabetes mellitus without complications: Secondary | ICD-10-CM | POA: Diagnosis not present

## 2017-12-10 DIAGNOSIS — E1165 Type 2 diabetes mellitus with hyperglycemia: Secondary | ICD-10-CM | POA: Diagnosis not present

## 2017-12-10 DIAGNOSIS — Z79899 Other long term (current) drug therapy: Secondary | ICD-10-CM | POA: Diagnosis not present

## 2017-12-10 DIAGNOSIS — K760 Fatty (change of) liver, not elsewhere classified: Secondary | ICD-10-CM | POA: Diagnosis not present

## 2017-12-10 DIAGNOSIS — Z6841 Body Mass Index (BMI) 40.0 and over, adult: Secondary | ICD-10-CM | POA: Diagnosis not present

## 2017-12-24 ENCOUNTER — Encounter: Payer: Self-pay | Admitting: Gastroenterology

## 2018-02-13 DIAGNOSIS — R69 Illness, unspecified: Secondary | ICD-10-CM | POA: Diagnosis not present

## 2018-03-13 DIAGNOSIS — R739 Hyperglycemia, unspecified: Secondary | ICD-10-CM | POA: Diagnosis not present

## 2018-03-13 DIAGNOSIS — K760 Fatty (change of) liver, not elsewhere classified: Secondary | ICD-10-CM | POA: Diagnosis not present

## 2018-03-13 DIAGNOSIS — Z0001 Encounter for general adult medical examination with abnormal findings: Secondary | ICD-10-CM | POA: Diagnosis not present

## 2018-03-13 DIAGNOSIS — E785 Hyperlipidemia, unspecified: Secondary | ICD-10-CM | POA: Diagnosis not present

## 2018-03-13 DIAGNOSIS — Z125 Encounter for screening for malignant neoplasm of prostate: Secondary | ICD-10-CM | POA: Diagnosis not present

## 2018-03-13 DIAGNOSIS — Z79899 Other long term (current) drug therapy: Secondary | ICD-10-CM | POA: Diagnosis not present

## 2018-03-18 DIAGNOSIS — Z0000 Encounter for general adult medical examination without abnormal findings: Secondary | ICD-10-CM | POA: Diagnosis not present

## 2018-03-18 DIAGNOSIS — E785 Hyperlipidemia, unspecified: Secondary | ICD-10-CM | POA: Diagnosis not present

## 2018-03-18 DIAGNOSIS — K760 Fatty (change of) liver, not elsewhere classified: Secondary | ICD-10-CM | POA: Diagnosis not present

## 2018-03-18 DIAGNOSIS — E119 Type 2 diabetes mellitus without complications: Secondary | ICD-10-CM | POA: Diagnosis not present

## 2018-03-18 DIAGNOSIS — Z6839 Body mass index (BMI) 39.0-39.9, adult: Secondary | ICD-10-CM | POA: Diagnosis not present

## 2018-03-18 DIAGNOSIS — E6609 Other obesity due to excess calories: Secondary | ICD-10-CM | POA: Diagnosis not present

## 2018-03-18 DIAGNOSIS — Z79899 Other long term (current) drug therapy: Secondary | ICD-10-CM | POA: Diagnosis not present

## 2018-06-22 DIAGNOSIS — J309 Allergic rhinitis, unspecified: Secondary | ICD-10-CM | POA: Diagnosis not present

## 2018-06-22 DIAGNOSIS — H6692 Otitis media, unspecified, left ear: Secondary | ICD-10-CM | POA: Diagnosis not present

## 2018-06-26 DIAGNOSIS — E1165 Type 2 diabetes mellitus with hyperglycemia: Secondary | ICD-10-CM | POA: Diagnosis not present

## 2018-06-26 DIAGNOSIS — R739 Hyperglycemia, unspecified: Secondary | ICD-10-CM | POA: Diagnosis not present

## 2018-06-26 DIAGNOSIS — K760 Fatty (change of) liver, not elsewhere classified: Secondary | ICD-10-CM | POA: Diagnosis not present

## 2018-06-26 DIAGNOSIS — Z79899 Other long term (current) drug therapy: Secondary | ICD-10-CM | POA: Diagnosis not present

## 2018-06-26 DIAGNOSIS — E785 Hyperlipidemia, unspecified: Secondary | ICD-10-CM | POA: Diagnosis not present

## 2018-07-01 DIAGNOSIS — E119 Type 2 diabetes mellitus without complications: Secondary | ICD-10-CM | POA: Diagnosis not present

## 2018-07-01 DIAGNOSIS — E785 Hyperlipidemia, unspecified: Secondary | ICD-10-CM | POA: Diagnosis not present

## 2018-07-01 DIAGNOSIS — Z7984 Long term (current) use of oral hypoglycemic drugs: Secondary | ICD-10-CM | POA: Diagnosis not present

## 2018-07-01 DIAGNOSIS — K429 Umbilical hernia without obstruction or gangrene: Secondary | ICD-10-CM | POA: Diagnosis not present

## 2018-10-14 ENCOUNTER — Encounter: Payer: Self-pay | Admitting: Gastroenterology

## 2019-11-04 ENCOUNTER — Encounter: Payer: Self-pay | Admitting: Gastroenterology

## 2020-07-07 ENCOUNTER — Emergency Department (HOSPITAL_COMMUNITY)
Admission: EM | Admit: 2020-07-07 | Discharge: 2020-07-07 | Disposition: A | Discharge status: Home / Self Care | Payer: Medicare HMO | Attending: Emergency Medicine | Admitting: Emergency Medicine

## 2020-07-07 ENCOUNTER — Encounter (HOSPITAL_COMMUNITY): Payer: Self-pay | Admitting: *Deleted

## 2020-07-07 ENCOUNTER — Other Ambulatory Visit: Payer: Self-pay

## 2020-07-07 DIAGNOSIS — Z87891 Personal history of nicotine dependence: Secondary | ICD-10-CM | POA: Diagnosis not present

## 2020-07-07 DIAGNOSIS — S61411A Laceration without foreign body of right hand, initial encounter: Secondary | ICD-10-CM | POA: Diagnosis present

## 2020-07-07 DIAGNOSIS — Z23 Encounter for immunization: Secondary | ICD-10-CM | POA: Insufficient documentation

## 2020-07-07 DIAGNOSIS — Y9289 Other specified places as the place of occurrence of the external cause: Secondary | ICD-10-CM | POA: Diagnosis not present

## 2020-07-07 DIAGNOSIS — W268XXA Contact with other sharp object(s), not elsewhere classified, initial encounter: Secondary | ICD-10-CM | POA: Diagnosis not present

## 2020-07-07 MED ORDER — LIDOCAINE HCL (PF) 2 % IJ SOLN
INTRAMUSCULAR | Status: AC
  Filled 2020-07-07: qty 10

## 2020-07-07 MED ORDER — TETANUS-DIPHTH-ACELL PERTUSSIS 5-2.5-18.5 LF-MCG/0.5 IM SUSP
0.5000 mL | Freq: Once | INTRAMUSCULAR | Status: AC
  Administered 2020-07-07: 0.5 mL via INTRAMUSCULAR
  Filled 2020-07-07: qty 0.5

## 2020-07-07 NOTE — ED Triage Notes (Signed)
Laceration to right hand °

## 2020-07-07 NOTE — Discharge Instructions (Signed)
Have your sutures removed in 10 days.  Keep your wound clean and dry,  Until a good scab forms - you may then wash gently twice daily with mild soap and water, but dry completely after.  Get rechecked for any sign of infection (redness,  Swelling,  Increased pain or drainage of purulent fluid). ° °

## 2020-07-09 NOTE — ED Provider Notes (Signed)
[facility] EMERGENCY DEPARTMENT Provider Note   CSN: [id number] Arrival date & time: 07/07/20  1630     History Chief Complaint  Patient presents with  . Laceration    [name] [name] is a 54 y.o. male presenting with laceration to his right hand which occurred just prior to arrival.  He injured the hand on a sharp metal edge in his workshop.  He denies weakness or numbness distal to the injury site.  He is right-handed.  He has washed the wound and applied direct pressure to the site prior to arrival and has obtained hemostasis.  He is unsure of his tetanus status. HPI     Past Medical History:  Diagnosis Date  . Arthritis   . Cirrhosis ([place])   . Depression   . Umbilical hernia     Patient Active Problem List   Diagnosis Date Noted  . 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 Dyspepsia   . Thrombocytopenia ([place]) 10/19/2015    Past Surgical History:  Procedure Laterality Date  . COLONOSCOPY N/A 05/14/2016   Procedure: COLONOSCOPY;  Surgeon: Danie Binder, MD;  Location[facility];  Service: Endoscopy;  Laterality: N/A;  1000 - moved to 1:30 - office to notify pt  . ESOPHAGOGASTRODUODENOSCOPY N/A 11/01/2015   Procedure: ESOPHAGOGASTRODUODENOSCOPY (EGD);  Surgeon: Danie Binder, MD;  Location: [facility];  Service: Endoscopy;  Laterality: N/A;  245 - moved to 2/1 - office to notify   . FOOT SURGERY    . HAND SURGERY Right   . KNEE SURGERY         Family History  Problem Relation Age of Onset  . Colon cancer Neg Hx   . Colon polyps Neg Hx   . Stomach cancer Neg Hx   . Pancreatic cancer Neg Hx     Social History   Tobacco Use  . Smoking status: Former Smoker    Types: Cigarettes  . Smokeless tobacco: Never Used  Substance Use Topics  . Alcohol use: No  . Drug use: No    Home Medications Prior to Admission medications   Medication Sig Start Date End Date Taking? Authorizing Provider  oxyCODONE-acetaminophen (PERCOCET) 5-325 MG  tablet Take 1-2 tablets by mouth every 4 (four) hours as needed. 01/04/17   Nat Christen, MD  predniSONE (DELTASONE) 10 MG tablet Take 2 tablets (20 mg total) by mouth daily. 01/04/17   Nat Christen, MD    Allergies    Patient has no known allergies.  Review of Systems   Review of Systems  Constitutional: Negative for chills and fever.  Skin: Positive for wound.  Neurological: Negative for weakness and numbness.  All other systems reviewed and are negative.   Physical Exam Updated Vital Signs BP 129/86 (BP Location: Left Arm)   Pulse 82   Temp 98.2 F (36.8 C) (Oral)   Resp 17   SpO2 98%   Physical Exam Constitutional:      Appearance: He is well-developed.  HENT:     Head: Normocephalic.  Cardiovascular:     Rate and Rhythm: Normal rate.  Pulmonary:     Effort: Pulmonary effort is normal.  Musculoskeletal:        General: No tenderness.  Skin:    Findings: Laceration present.     Comments: There is a 2 cm subcutaneous laceration through his right thenar eminence.  Hemostatic.  Base of wound easily visualized, it is not through the fatty layer, no  deeper structures are visualized.  He has full range of motion of his fingers and thumb without weakness or deficit.   Neurological:     Mental Status: He is alert and oriented to person, place, and time.     Sensory: No sensory deficit.     ED Results / Procedures / Treatments   Labs (all labs ordered are listed, but only abnormal results are displayed) Labs Reviewed - No data to display  EKG None  Radiology No results found.  Procedures Procedures (including critical care time)  LACERATION REPAIR Performed by: Evalee Jefferson Authorized by: Evalee Jefferson Consent: Verbal consent obtained. Risks and benefits: risks, benefits and alternatives were discussed Consent given by: patient Patient identity confirmed: provided demographic data Prepped and Draped in normal sterile fashion Wound explored  Laceration Location:  right thenar eminence  Laceration Length: 2cm  No Foreign Bodies seen or palpated  Anesthesia: local infiltration  Local anesthetic: lidocaine 2% without epinephrine  Anesthetic total: 2 ml  Irrigation method: syringe Amount of cleaning: standard  Skin closure: ethilon 4-0  Number of sutures: 5  Technique: simple interrupted  Patient tolerance: Patient tolerated the procedure well with no immediate complications.   Medications Ordered in ED Medications  lidocaine HCl (PF) (XYLOCAINE) 2 % injection (  Given 07/07/20 1754)  Tdap (BOOSTRIX) injection 0.5 mL (0.5 mLs Intramuscular Given 07/07/20 1810)    ED Course  I have reviewed the triage vital signs and the nursing notes.  Pertinent labs & imaging results that were available during my care of the patient were reviewed by me and considered in my medical decision making (see chart for details).    MDM Rules/Calculators/A&P                          Wound care instructions given.  Pt advised to have sutures removed in 10 days,  Return here sooner for any signs of infection including redness, swelling, worse pain or drainage of pus.    Final Clinical Impression(s) / ED Diagnoses Final diagnoses:  Laceration of right hand without foreign body, initial encounter    Rx / DC Orders ED Discharge Orders    None       Landis Martins 07/09/20 1001    Daleen Bo, MD 07/10/20 1012

## 2021-01-01 ENCOUNTER — Ambulatory Visit
Admission: EM | Admit: 2021-01-01 | Discharge: 2021-01-01 | Disposition: A | Discharge status: Home / Self Care | Payer: Medicare Other

## 2021-01-01 ENCOUNTER — Other Ambulatory Visit: Payer: Self-pay

## 2021-01-01 ENCOUNTER — Encounter: Payer: Self-pay | Admitting: Emergency Medicine

## 2021-01-01 DIAGNOSIS — R0981 Nasal congestion: Secondary | ICD-10-CM

## 2021-01-01 DIAGNOSIS — J309 Allergic rhinitis, unspecified: Secondary | ICD-10-CM

## 2021-01-01 MED ORDER — LEVOCETIRIZINE DIHYDROCHLORIDE 5 MG PO TABS: 5.0000 mg | ORAL_TABLET | Freq: Every day | ORAL | 0 refills | Status: DC

## 2021-01-01 MED ORDER — IPRATROPIUM BROMIDE 0.03 % NA SOLN: 2.0000 | Freq: Three times a day (TID) | NASAL | 0 refills | Status: DC

## 2021-01-01 MED ORDER — PSEUDOEPHEDRINE HCL 60 MG PO TABS: 60.0000 mg | ORAL_TABLET | Freq: Two times a day (BID) | ORAL | 0 refills | Status: AC

## 2021-01-01 NOTE — ED Triage Notes (Signed)
Sinus congestion x 2 days.

## 2021-02-19 ENCOUNTER — Encounter: Payer: Self-pay | Admitting: Internal Medicine

## 2021-02-22 ENCOUNTER — Other Ambulatory Visit: Payer: Self-pay

## 2021-02-22 ENCOUNTER — Ambulatory Visit (INDEPENDENT_AMBULATORY_CARE_PROVIDER_SITE_OTHER): Payer: Medicare Other | Admitting: Internal Medicine

## 2021-02-22 ENCOUNTER — Encounter: Payer: Self-pay | Admitting: Internal Medicine

## 2021-02-22 VITALS — BP 148/78 | HR 81 | Temp 97.7°F | Ht 71.0 in | Wt 289.8 lb

## 2021-02-22 DIAGNOSIS — D126 Benign neoplasm of colon, unspecified: Secondary | ICD-10-CM | POA: Diagnosis not present

## 2021-02-22 DIAGNOSIS — K7460 Unspecified cirrhosis of liver: Secondary | ICD-10-CM | POA: Diagnosis not present

## 2021-02-22 NOTE — Patient Instructions (Signed)
I am going to order to updated meld labs in regards to your cirrhosis.  I am also going to order an ultrasound for liver cancer screening as well as an alpha-fetoprotein blood test which is a tumor marker.  We will schedule you for upper endoscopy for variceal screening.  At the same time we will perform colonoscopy due to your history of adenomatous colon polyps.  The most important thing you can do from a lifestyle standpoint is lose weight.  Recommend eating a healthy diet, avoiding snacks such as butter fingers, instituting 30 minutes of exercise at least 5 days a week.  It is also important to keep your high cholesterol and diabetes under good control as well.  Follow-up after procedures.  Further recommendations to follow  At [facility] we value your feedback. You may receive a survey about your visit today. Please share your experience as we strive to create trusting relationships with our patients to provide genuine, compassionate, quality care.  We appreciate your understanding and patience as we review any laboratory studies, imaging, and other diagnostic tests that are ordered as we care for you. Our office policy is 5 business days for review of these results, and any emergent or urgent results are addressed in a timely manner for your best interest. If you do not hear from our office in 1 week, please contact us.   We also encourage the use of MyChart, which contains your medical information for your review as well. If you are not enrolled in this feature, an access code is on this after visit summary for your convenience. Thank you for allowing [place] to be involved in your care.  It was great to see you today!  I hope you have a great rest of your spring!!    [name] [name]. Abbey Chatters, D.O. Gastroenterology and Hepatology [facility]

## 2021-02-22 NOTE — Progress Notes (Signed)
Referring Provider: Jani Gravel, MD Primary Care Physician:  Jani Gravel, MD Primary GI:  Dr. Abbey Chatters  Chief Complaint  Patient presents with  . Cirrhosis    Non alcoholic    HPI:   [name] [name] is a 55 y.o. male who presents to the clinic today by referral from his PCP Dr. Maudie Mercury for evaluation.  Patient has not been seen in our clinic since 2018.  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  He is accompanied by his wife [name] [name].  Risk factors for NASH include obesity, diabetes, dyslipidemia.  Recently had blood work done by his PCP that showed elevated aminotransferases.  No recent imaging.  Last EGD 2017 without esophageal varices.  Colonoscopy at the same time revealed multiple polyps removed 1 of which was a tubular adenoma.  Patient denies any confusion or severe fatigue.  No melena hematochezia.  No abdominal or leg swelling.  Patient does not drink alcohol.  Past Medical History:  Diagnosis Date  . Arthritis   . Cirrhosis ([place])   . Depression   . Umbilical hernia     Past Surgical History:  Procedure Laterality Date  . COLONOSCOPY N/A 05/14/2016   Procedure: COLONOSCOPY;  Surgeon: Danie Binder, MD;  [facility];  Service: Endoscopy;  Laterality: N/A;  1000 - moved to 1:30 - office to notify pt  . ESOPHAGOGASTRODUODENOSCOPY N/A 11/01/2015   Procedure: ESOPHAGOGASTRODUODENOSCOPY (EGD);  Surgeon: Danie Binder, MD;  [facility];  Service: Endoscopy;  Laterality: N/A;  245 - moved to 2/1 - office to notify   . FOOT SURGERY    . HAND SURGERY Right   . KNEE SURGERY      Current Outpatient Medications  Medication Sig Dispense Refill  . atorvastatin (LIPITOR) 10 MG tablet Take 10 mg by mouth daily.    . empagliflozin (JARDIANCE) 10 MG TABS tablet Take 10 mg by mouth daily.    . Semaglutide (OZEMPIC, 1 MG/DOSE, [place]) Inject into the skin once a week.     No current facility-administered medications for this visit.     Allergies as of 02/22/2021  . (No Known Allergies)    Family History  Problem Relation Age of Onset  . Colon cancer Neg Hx   . Colon polyps Neg Hx   . Stomach cancer Neg Hx   . Pancreatic cancer Neg Hx     Social History   Socioeconomic History  . Marital status: Married    Spouse name: Not on file  . Number of children: Not on file  . Years of education: Not on file  . Highest education level: Not on file  Occupational History  . Not on file  Tobacco Use  . Smoking status: Former Smoker    Types: Cigarettes  . Smokeless tobacco: Never Used  Substance and Sexual Activity  . Alcohol use: No  . Drug use: No  . Sexual activity: Never  Other Topics Concern  . Not on file  Social History Narrative  . Not on file   Social Determinants of Health   Financial Resource Strain: Not on file  Food Insecurity: Not on file  Transportation Needs: Not on file  Physical Activity: Not on file  Stress: Not on file  Social Connections: Not on file    Subjective: Review of Systems  Constitutional: Negative for chills and fever.  HENT: Negative for congestion and hearing loss.   Eyes: Negative for blurred vision  and double vision.  Respiratory: Negative for cough and shortness of breath.   Cardiovascular: Negative for chest pain and palpitations.  Gastrointestinal: Negative for abdominal pain, blood in stool, constipation, diarrhea, heartburn, melena and vomiting.  Genitourinary: Negative for dysuria and urgency.  Musculoskeletal: Negative for joint pain and myalgias.  Skin: Negative for itching and rash.  Neurological: Negative for dizziness and headaches.  Psychiatric/Behavioral: Negative for depression. The patient is not nervous/anxious.      Objective: BP (!) 148/78   Pulse 81   Temp 97.7 F (36.5 C)   Ht 5\' 11"  (1.803 m)   Wt 289 lb 12.8 oz (131.5 kg)   BMI 40.42 kg/m  Physical Exam Constitutional:      Appearance: Normal appearance. He is obese.  HENT:      Head: Normocephalic and atraumatic.  Eyes:     Extraocular Movements: Extraocular movements intact.     Conjunctiva/sclera: Conjunctivae normal.  Cardiovascular:     Rate and Rhythm: Normal rate and regular rhythm.  Pulmonary:     Effort: Pulmonary effort is normal.     Breath sounds: Normal breath sounds.  Abdominal:     General: Bowel sounds are normal.     Palpations: Abdomen is soft.  Musculoskeletal:        General: Normal range of motion.     Cervical back: Normal range of motion and neck supple.  Skin:    General: Skin is warm.  Neurological:     General: No focal deficit present.     Mental Status: He is alert and oriented to person, place, and time.  Psychiatric:        [name] and Affect: [name] normal.        Behavior: Behavior normal.      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  Patient would like to reestablish with [place] going forward.  We will check updated meld labs today in clinic.  I will also order right upper quadrant ultrasound as well as AFP for [place] screening.  Will schedule for EGD to for variceal screening.  At the same time we will perform colonoscopy for surveillance purposes due to history of adenomatous colon polyp.  The risks including infection, bleed, or perforation as well as benefits, limitations, alternatives and imponderables have been reviewed with the patient. Potential for esophageal dilation, biopsy, etc. have also been reviewed.  Questions have been answered. All parties agreeable.  No evidence of hypervolemia on exam today.  Counseled on low-sodium diet.  Discussed NASH in depth with him today.  Counseled on the vast importance of keeping his diabetes and dyslipidemia under good control.  Also recommended weight loss of at least 10% which would be 29 pounds.  Recommended he institute 30 minutes of exercise 5 days a week.   States his biggest issue is with snacking throughout the day on his kids foods including butter fingers.  His wife states they actually eat healthy meals.  Patient to follow-up after his procedures.   02/22/2021 4:08 PM   Disclaimer: This note was dictated with voice recognition software. Similar sounding words can inadvertently be transcribed and may not be corrected upon review.

## 2021-02-23 ENCOUNTER — Telehealth: Payer: Self-pay

## 2021-02-23 ENCOUNTER — Other Ambulatory Visit: Payer: Self-pay

## 2021-02-23 MED ORDER — CLENPIQ 10-3.5-12 MG-GM -GM/160ML PO SOLN: 1.0000 | Freq: Once | ORAL | 0 refills | Status: AC

## 2021-02-23 NOTE — Telephone Encounter (Signed)
PA for TCS/EGD submitted via [facility] website. PA# [id number], valid 03/27/21-06/25/21.

## 2021-03-01 ENCOUNTER — Ambulatory Visit: Payer: Medicare Other | Admitting: Gastroenterology

## 2021-03-05 ENCOUNTER — Ambulatory Visit (HOSPITAL_COMMUNITY): Admission: RE | Admit: 2021-03-05 | Payer: Medicare Other | Source: Ambulatory Visit

## 2021-03-06 ENCOUNTER — Ambulatory Visit (HOSPITAL_COMMUNITY)
Admission: RE | Admit: 2021-03-06 | Discharge: 2021-03-06 | Disposition: A | Discharge status: Home / Self Care | Payer: Medicare Other | Source: Ambulatory Visit | Attending: Internal Medicine | Admitting: Internal Medicine

## 2021-03-06 ENCOUNTER — Other Ambulatory Visit: Payer: Self-pay

## 2021-03-06 DIAGNOSIS — K7460 Unspecified cirrhosis of liver: Secondary | ICD-10-CM | POA: Diagnosis not present

## 2021-03-06 IMAGING — US US ABDOMEN LIMITED
1 series · 14 of 25 positions shown · non-contrast
Comparison: Right upper quadrant ultrasound dated [REDACTED] [DATE].

CLINICAL DATA: Cirrhosis.

EXAM:
ULTRASOUND ABDOMEN LIMITED RIGHT UPPER QUADRANT

[Series 1: us abdomen limited ruq (liver/gb) · 91 acquisitions, 14 frames shown]
[im 1/91]
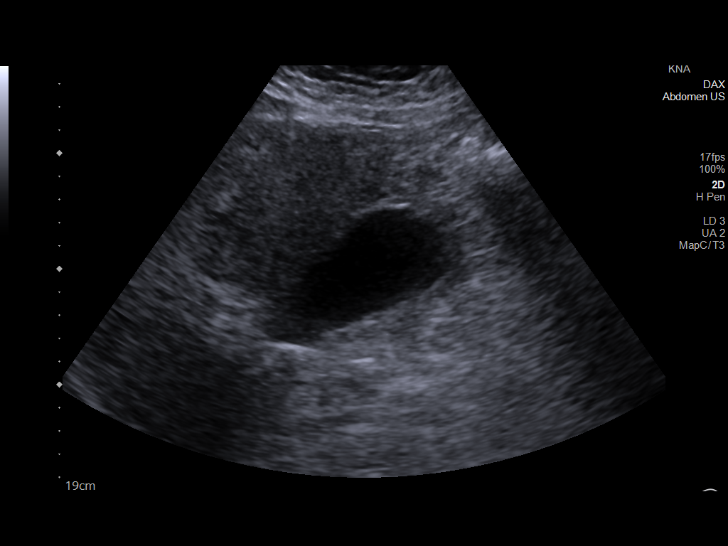
[im 8/91]
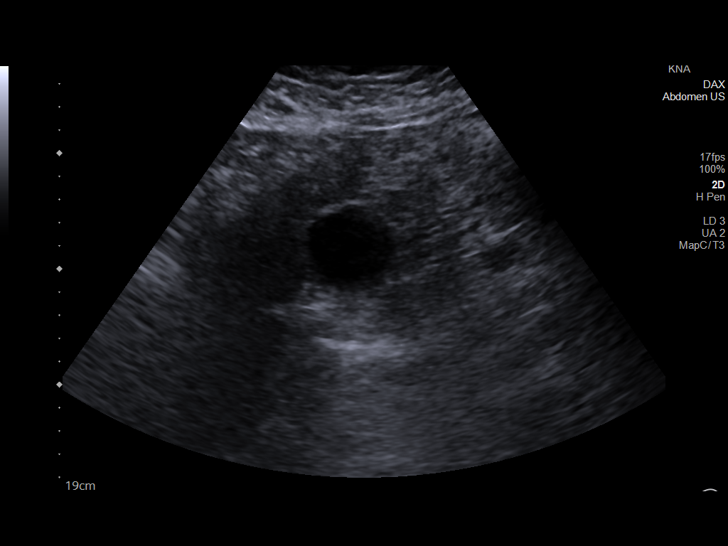
[im 16/91]
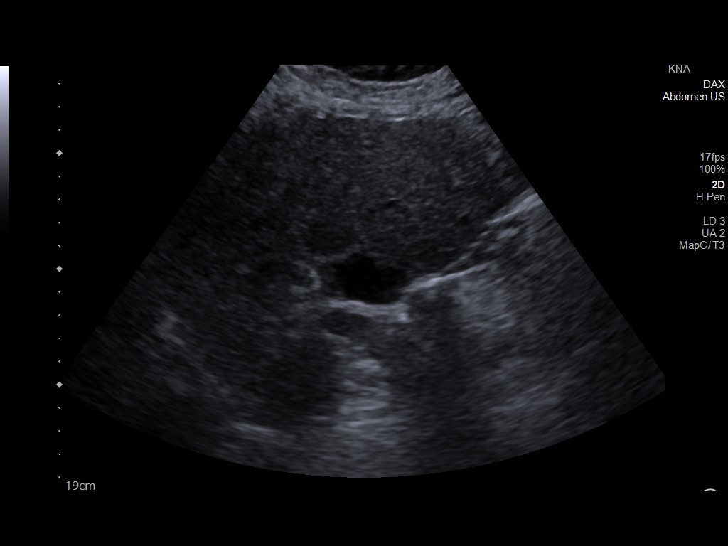
[im 23/91]
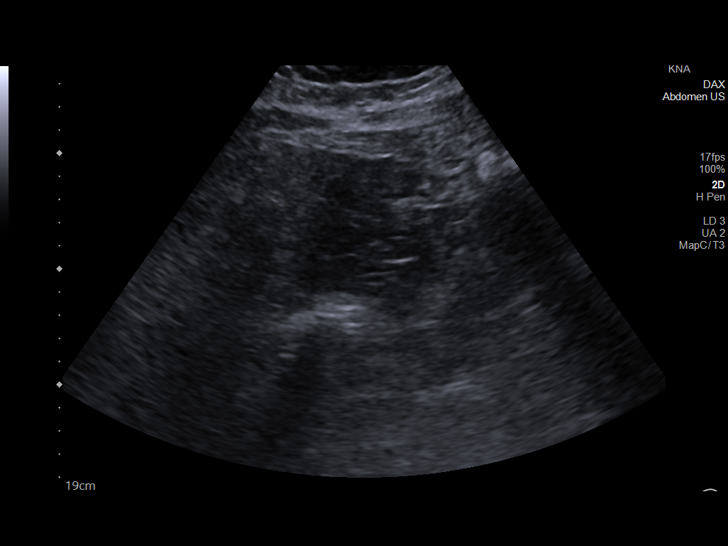
[im 31/91]
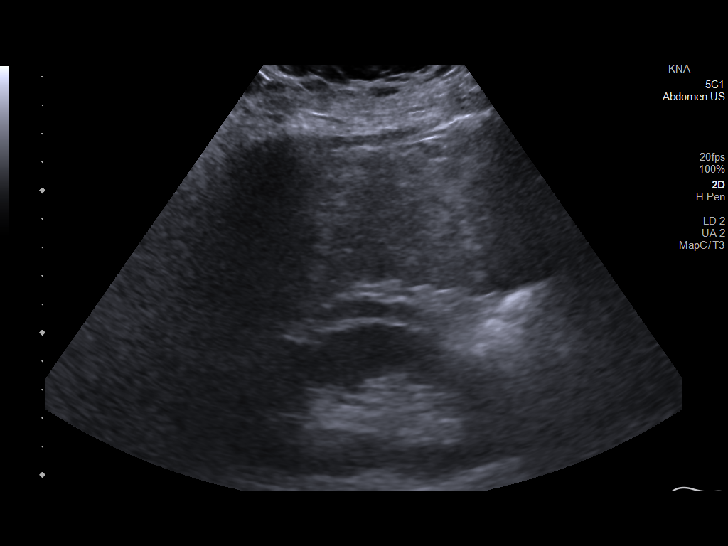
[im 34/91]
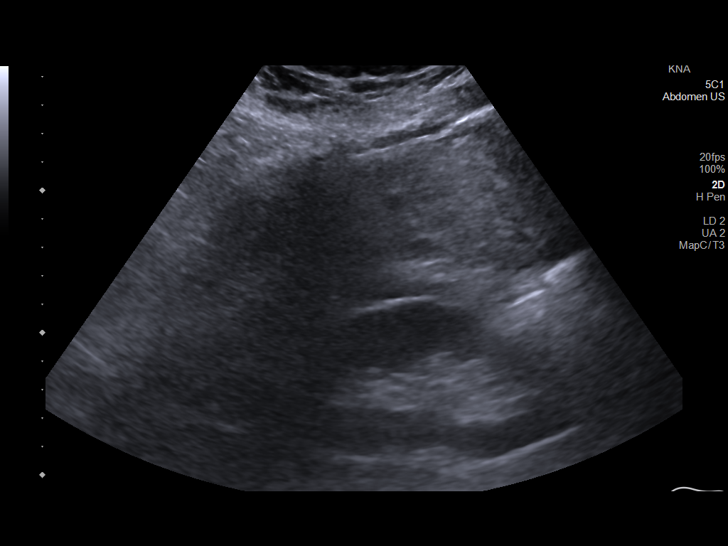
[im 42/91]
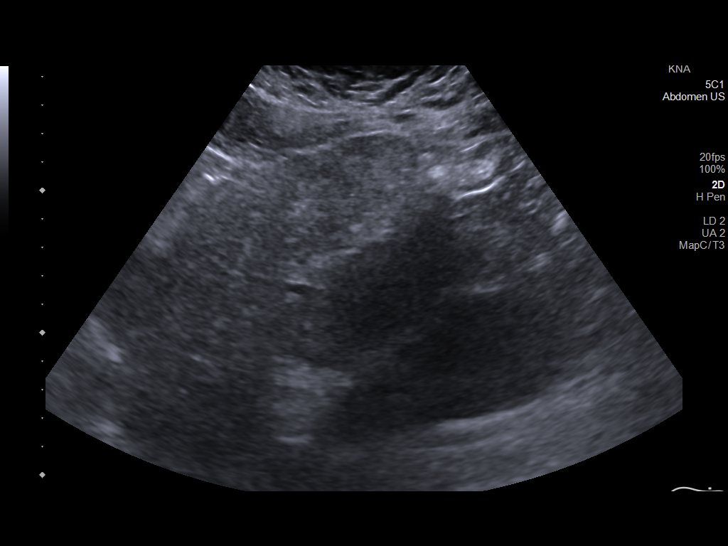
[im 49/91]
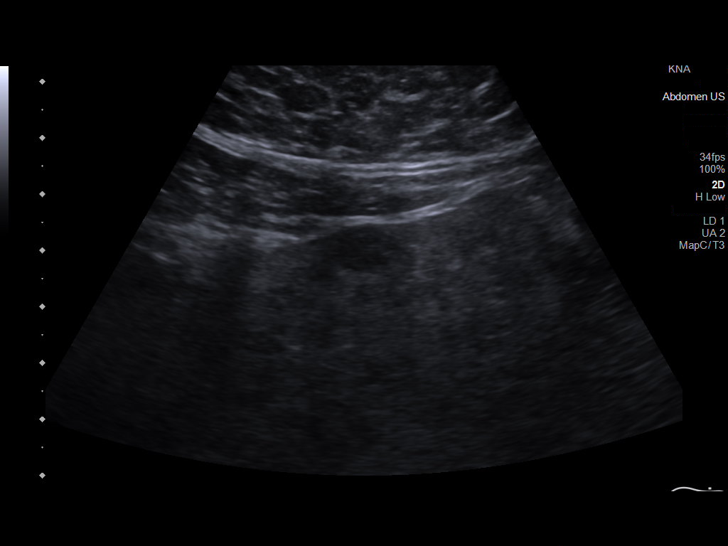
[im 57/91]
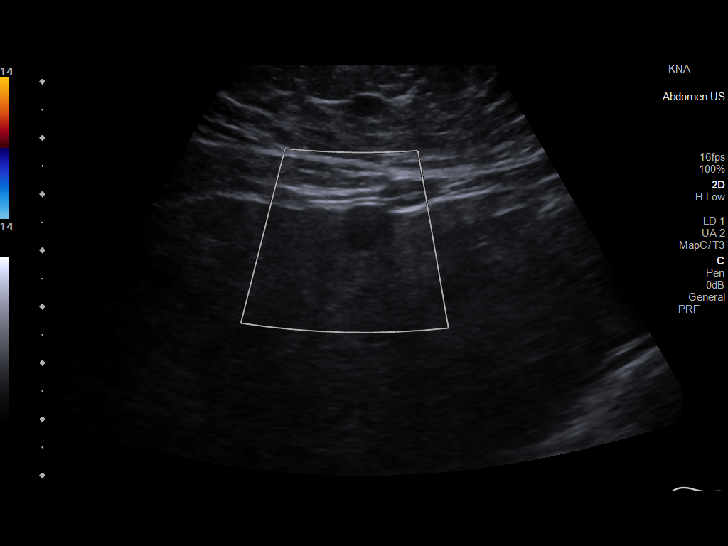
[im 61/91]
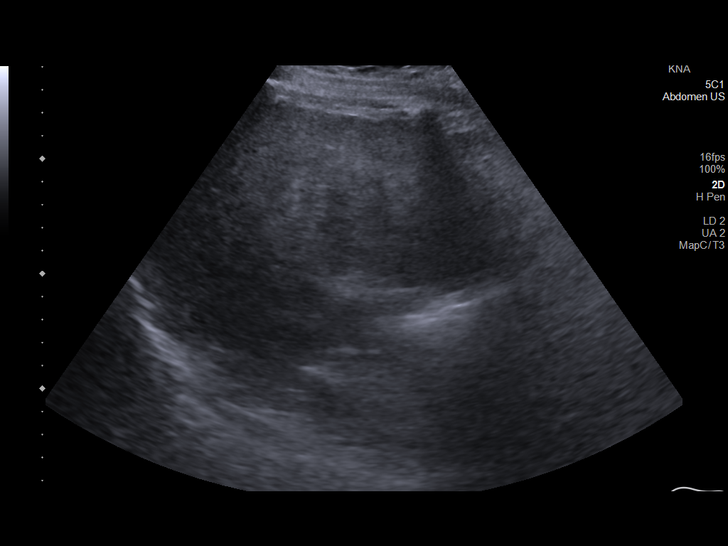
[im 68/91]
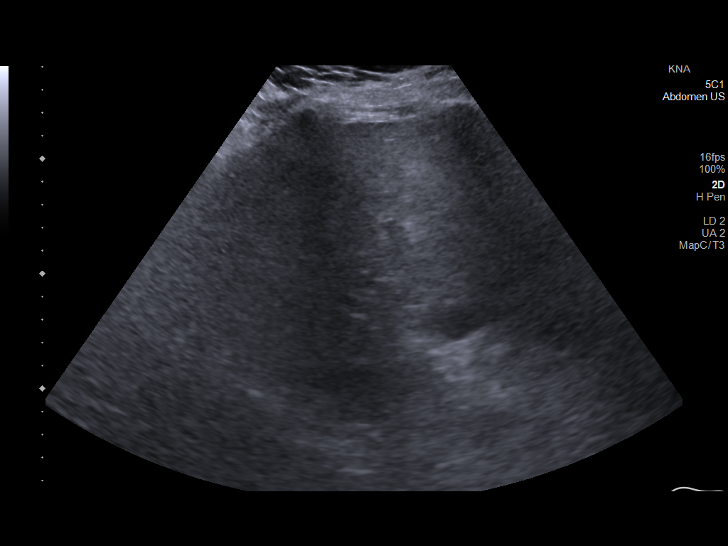
[im 76/91]
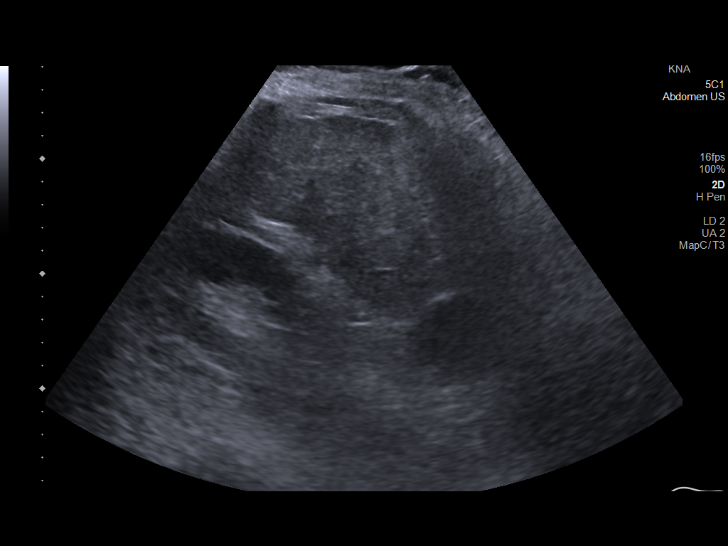
[im 83/91]
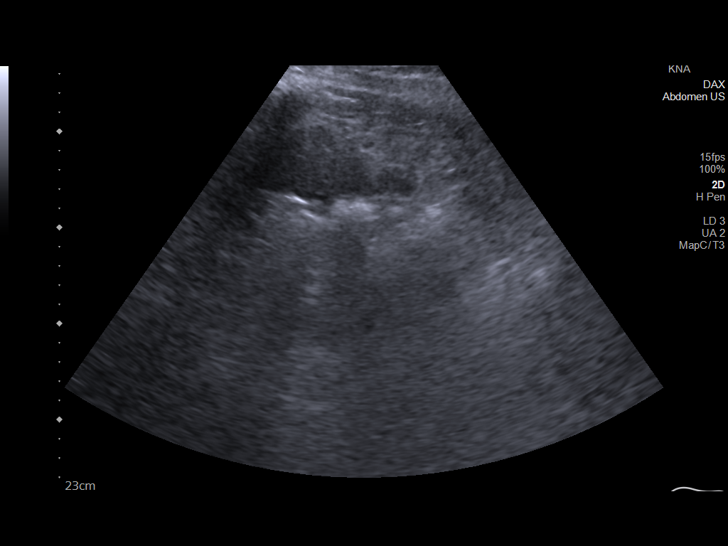
[im 91/91]
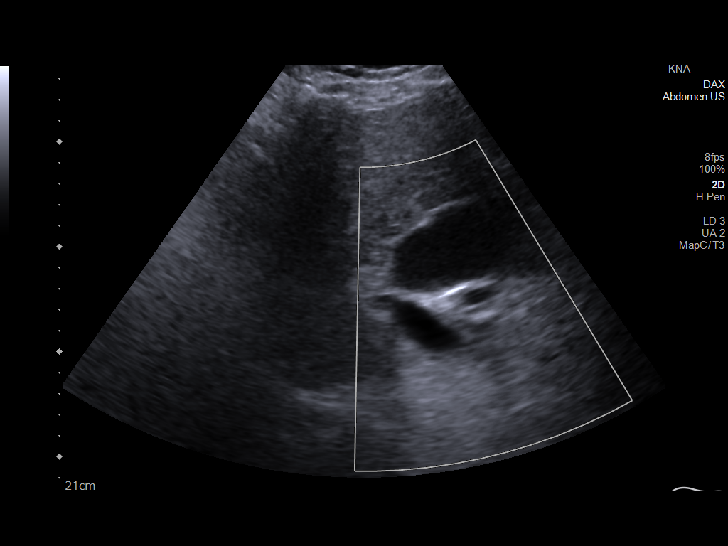

[14 of 25 positions shown; findings below may reference images not displayed]

FINDINGS: Gallbladder:

No gallstones or wall thickening visualized. No sonographic Murphy
sign noted by sonographer.

Common bile duct:

Diameter: 3 mm, normal.

Liver:

Unchanged nodular contour with coarsened, heterogeneously increased
parenchymal echogenicity. New 1.1 x 0.9 x 1.0 cm hypoechoic
subcapsular mass in the left hepatic lobe. Portal vein is patent on
color Doppler imaging with normal direction of blood flow towards
the liver.

Other: None.
IMPRESSION: 1. Cirrhosis with new 1.1 cm hypoechoic subcapsular mass in the left
hepatic lobe. Recommend liver protocol MRI of the abdomen with and
without contrast for further evaluation.

## 2021-03-08 ENCOUNTER — Other Ambulatory Visit: Payer: Self-pay

## 2021-03-08 ENCOUNTER — Ambulatory Visit
Admission: EM | Admit: 2021-03-08 | Discharge: 2021-03-08 | Disposition: A | Discharge status: Home / Self Care | Payer: Medicare Other | Attending: Internal Medicine | Admitting: Internal Medicine

## 2021-03-08 DIAGNOSIS — L308 Other specified dermatitis: Secondary | ICD-10-CM | POA: Diagnosis not present

## 2021-03-08 DIAGNOSIS — A6929 Other conditions associated with Lyme disease: Secondary | ICD-10-CM | POA: Diagnosis not present

## 2021-03-08 DIAGNOSIS — K760 Fatty (change of) liver, not elsewhere classified: Secondary | ICD-10-CM

## 2021-03-08 DIAGNOSIS — K769 Liver disease, unspecified: Secondary | ICD-10-CM

## 2021-03-08 MED ORDER — DOXYCYCLINE HYCLATE 100 MG PO CAPS: 100.0000 mg | ORAL_CAPSULE | Freq: Two times a day (BID) | ORAL | 0 refills | Status: AC

## 2021-03-08 NOTE — ED Triage Notes (Signed)
Pt presents with bulls eye rash on back from tick bite 2 weeks ago

## 2021-03-08 NOTE — Discharge Instructions (Addendum)
Please take medications as prescribed If you have worsening symptoms i.e. extension of the rash, generalized body pains or fever please return to urgent care to be reevaluated.

## 2021-03-11 NOTE — ED Provider Notes (Addendum)
[facility]    CSN: [id number] Arrival date & time: 03/08/21  1229      History   Chief Complaint Chief Complaint  Patient presents with   Insect Bite    HPI [name] [name] is a 55 y.o. male comes to the urgent care with a rash over the left upper back.  Patient had a tick bite 2 weeks ago..  The tick was gone the patient for an unknown number of days.  Tick was engorged.  Patient comes in today with a rash over the left upper back.  The rash is worsening.  No fever or chills.  No body aches or headaches.   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   Dyspepsia    Thrombocytopenia ([place]) 10/19/2015    Past Surgical History:  Procedure Laterality Date   COLONOSCOPY N/A 05/14/2016   Procedure: COLONOSCOPY;  Surgeon: Danie Binder, MD;  [facility];  Service: Endoscopy;  Laterality: N/A;  1000 - moved to 1:30 - office to notify pt   ESOPHAGOGASTRODUODENOSCOPY N/A 11/01/2015   Procedure: ESOPHAGOGASTRODUODENOSCOPY (EGD);  Surgeon: Danie Binder, MD;  [facility];  Service: Endoscopy;  Laterality: N/A;  245 - moved to 2/1 - office to notify    FOOT SURGERY     HAND SURGERY Right    KNEE SURGERY         Home Medications    Prior to Admission medications   Medication Sig Start Date End Date Taking? Authorizing Provider  doxycycline (VIBRAMYCIN) 100 MG capsule Take 1 capsule (100 mg total) by mouth 2 (two) times daily for 14 days. 03/08/21 03/22/21 Yes Londen Bok, Myrene Galas, MD  atorvastatin (LIPITOR) 10 MG tablet Take 10 mg by mouth daily.    [provider]  empagliflozin (JARDIANCE) 10 MG TABS tablet Take 10 mg by mouth daily.    [provider]  Semaglutide (OZEMPIC, 1 MG/DOSE, [place]) Inject into the skin once  a week.    [provider]    Family History Family History  Problem Relation Age of Onset   Colon cancer Neg Hx    Colon polyps Neg Hx    Stomach cancer Neg Hx    Pancreatic cancer Neg Hx     Social History Social History   Tobacco Use   Smoking status: Former    Pack years: 0.00    Types: Cigarettes   Smokeless tobacco: Never  Substance Use Topics   Alcohol use: No   Drug use: No     Allergies   Patient has no known allergies.   Review of Systems Review of Systems  Respiratory: Negative.    Cardiovascular: Negative.   Gastrointestinal: Negative.   Skin:  Positive for color change and rash.    Physical Exam Triage Vital Signs ED Triage Vitals [03/08/21 1354]  Enc Vitals Group     BP (!) 153/91     Pulse Rate 85     Resp 20     Temp 98.1 F (36.7 C)     Temp src      SpO2 95 %     Weight      Height  Head Circumference      Peak Flow      Pain Score      Pain Loc      Pain Edu?      Excl. in [place]?    No data found.  Updated Vital Signs BP (!) 153/91   Pulse 85   Temp 98.1 F (36.7 C)   Resp 20   SpO2 95%   Visual Acuity Right Eye Distance:   Left Eye Distance:   Bilateral Distance:    Right Eye Near:   Left Eye Near:    Bilateral Near:     Physical Exam Vitals and nursing note reviewed.  Skin:    General: Skin is warm.     Findings: Erythema present.     Comments: Erythema migrans  Neurological:     General: No focal deficit present.     Mental Status: He is alert and oriented to person, place, and time.     UC Treatments / Results  Labs (all labs ordered are listed, but only abnormal results are displayed) Labs Reviewed - No data to display  EKG   Radiology No results found.  Procedures Procedures (including critical care time)  Medications Ordered in UC Medications - No data to display  Initial Impression / Assessment and Plan / UC Course  I have reviewed the triage vital signs and the nursing  notes.  Pertinent labs & imaging results that were available during my care of the patient were reviewed by me and considered in my medical decision making (see chart for details).     Lyme dermatitis: Doxycycline 100 mg twice daily for 14 days Return to urgent care if symptoms worsen The clinical presentation and physical findings are classic of lyme's disease Dermatitis and no testing indicated. Final Clinical Impressions(s) / UC Diagnoses   Final diagnoses:  Lyme dermatitis     Discharge Instructions      Please take medications as prescribed If you have worsening symptoms i.e. extension of the rash, generalized body pains or fever please return to urgent care to be reevaluated.   ED Prescriptions     Medication Sig Dispense Auth. Provider   doxycycline (VIBRAMYCIN) 100 MG capsule Take 1 capsule (100 mg total) by mouth 2 (two) times daily for 14 days. 28 capsule Sharniece Gibbon, Myrene Galas, MD      PDMP not reviewed this encounter.   Chase Picket, MD 03/11/21 1045    Chase Picket, MD 03/11/21 1045

## 2021-03-19 NOTE — Patient Instructions (Signed)
[name] [name]  03/19/2021     @PREFPERIOPPHARMACY @   Your procedure is scheduled on  03/27/2021.   Report to [facility] at  1120  A.M.   Call this number if you have problems the morning of surgery:  [phone]   Remember:  Follow the diet and prep instructions given to you by the office.    Take these medicines the morning of surgery with A SIP OF WATER     None.     Please brush your teeth.  Do not wear jewelry, make-up or nail polish.  Do not wear lotions, powders, or perfumes, or deodorant.  Do not shave 48 hours prior to surgery.  Men may shave face and neck.  Do not bring valuables to the hospital.  [facility] is not responsible for any belongings or valuables.  Contacts, dentures or bridgework may not be worn into surgery.  Leave your suitcase in the car.  After surgery it may be brought to your room.  For patients admitted to the hospital, discharge time will be determined by your treatment team.  Patients discharged the day of surgery will not be allowed to drive home and must have someone with them for 24 hours.    Special instructions:   DO NOT smoke tobacco or vape for 24 hours before your procedure.  Please read over the following fact sheets that you were given. Anesthesia Post-op Instructions and Care and Recovery After Surgery      Upper Endoscopy, Adult, Care After This sheet gives you information about how to care for yourself after your procedure. Your health care provider may also give you more specific instructions. If you have problems or questions, contact your health careprovider. What can I expect after the procedure? After the procedure, it is common to have: A sore throat. Mild stomach pain or discomfort. Bloating. Nausea. Follow these instructions at home:  Follow instructions from your health care provider about what to eat or drink after your procedure. Return to your normal activities as told by your health care  provider. Ask your health care provider what activities are safe for you. Take over-the-counter and prescription medicines only as told by your health care provider. If you were given a sedative during the procedure, it can affect you for several hours. Do not drive or operate machinery until your health care provider says that it is safe. Keep all follow-up visits as told by your health care provider. This is important. Contact a health care provider if you have: A sore throat that lasts longer than one day. Trouble swallowing. Get help right away if: You vomit blood or your vomit looks like coffee grounds. You have: A fever. Bloody, black, or tarry stools. A severe sore throat or you cannot swallow. Difficulty breathing. Severe pain in your chest or abdomen. Summary After the procedure, it is common to have a sore throat, mild stomach discomfort, bloating, and nausea. If you were given a sedative during the procedure, it can affect you for several hours. Do not drive or operate machinery until your health care provider says that it is safe. Follow instructions from your health care provider about what to eat or drink after your procedure. Return to your normal activities as told by your health care provider. This information is not intended to replace advice given to you by your health care provider. Make sure you discuss any questions you have with your healthcare provider. Document  Revised: 09/14/2019 Document Reviewed: 02/16/2018 Elsevier Patient Education  2022 [place]. Colonoscopy, Adult, Care After This sheet gives you information about how to care for yourself after your procedure. Your health care provider may also give you more specific instructions. If you have problems or questions, contact your health careprovider. What can I expect after the procedure? After the procedure, it is common to have: A small amount of blood in your stool for 24 hours after the  procedure. Some gas. Mild cramping or bloating of your abdomen. Follow these instructions at home: Eating and drinking  Drink enough fluid to keep your urine pale yellow. Follow instructions from your health care provider about eating or drinking restrictions. Resume your normal diet as instructed by your health care provider. Avoid heavy or fried foods that are hard to digest.  Activity Rest as told by your health care provider. Avoid sitting for a long time without moving. Get up to take short walks every 1-2 hours. This is important to improve blood flow and breathing. Ask for help if you feel weak or unsteady. Return to your normal activities as told by your health care provider. Ask your health care provider what activities are safe for you. Managing cramping and bloating  Try walking around when you have cramps or feel bloated. Apply heat to your abdomen as told by your health care provider. Use the heat source that your health care provider recommends, such as a moist heat pack or a heating pad. Place a towel between your skin and the heat source. Leave the heat on for 20-30 minutes. Remove the heat if your skin turns bright red. This is especially important if you are unable to feel pain, heat, or cold. You may have a greater risk of getting burned.  General instructions If you were given a sedative during the procedure, it can affect you for several hours. Do not drive or operate machinery until your health care provider says that it is safe. For the first 24 hours after the procedure: Do not sign important documents. Do not drink alcohol. Do your regular daily activities at a slower pace than normal. Eat soft foods that are easy to digest. Take over-the-counter and prescription medicines only as told by your health care provider. Keep all follow-up visits as told by your health care provider. This is important. Contact a health care provider if: You have blood in your stool  2-3 days after the procedure. Get help right away if you have: More than a small spotting of blood in your stool. Large blood clots in your stool. Swelling of your abdomen. Nausea or vomiting. A fever. Increasing pain in your abdomen that is not relieved with medicine. Summary After the procedure, it is common to have a small amount of blood in your stool. You may also have mild cramping and bloating of your abdomen. If you were given a sedative during the procedure, it can affect you for several hours. Do not drive or operate machinery until your health care provider says that it is safe. Get help right away if you have a lot of blood in your stool, nausea or vomiting, a fever, or increased pain in your abdomen. This information is not intended to replace advice given to you by your health care provider. Make sure you discuss any questions you have with your healthcare provider. Document Revised: 09/10/2019 Document Reviewed: 04/12/2019 Elsevier Patient Education  [place] After This sheet gives you information  about how to care for yourself after your procedure. Your health care provider may also give you more specific instructions. If you have problems or questions, contact your health careprovider. What can I expect after the procedure? After the procedure, it is common to have: Tiredness. Forgetfulness about what happened after the procedure. Impaired judgment for important decisions. Nausea or vomiting. Some difficulty with balance. Follow these instructions at home: For the time period you were told by your health care provider:     Rest as needed. Do not participate in activities where you could fall or become injured. Do not drive or use machinery. Do not drink alcohol. Do not take sleeping pills or medicines that cause drowsiness. Do not make important decisions or sign legal documents. Do not take care of children on your  own. Eating and drinking Follow the diet that is recommended by your health care provider. Drink enough fluid to keep your urine pale yellow. If you vomit: Drink water, juice, or soup when you can drink without vomiting. Make sure you have little or no nausea before eating solid foods. General instructions Have a responsible adult stay with you for the time you are told. It is important to have someone help care for you until you are awake and alert. Take over-the-counter and prescription medicines only as told by your health care provider. If you have sleep apnea, surgery and certain medicines can increase your risk for breathing problems. Follow instructions from your health care provider about wearing your sleep device: Anytime you are sleeping, including during daytime naps. While taking prescription pain medicines, sleeping medicines, or medicines that make you drowsy. Avoid smoking. Keep all follow-up visits as told by your health care provider. This is important. Contact a health care provider if: You keep feeling nauseous or you keep vomiting. You feel light-headed. You are still sleepy or having trouble with balance after 24 hours. You develop a rash. You have a fever. You have redness or swelling around the IV site. Get help right away if: You have trouble breathing. You have new-onset confusion at home. Summary For several hours after your procedure, you may feel tired. You may also be forgetful and have poor judgment. Have a responsible adult stay with you for the time you are told. It is important to have someone help care for you until you are awake and alert. Rest as told. Do not drive or operate machinery. Do not drink alcohol or take sleeping pills. Get help right away if you have trouble breathing, or if you suddenly become confused. This information is not intended to replace advice given to you by your health care provider. Make sure you discuss any questions you  have with your healthcare provider. Document Revised: 06/01/2020 Document Reviewed: 08/19/2019 Elsevier Patient Education  2022 [employer].

## 2021-03-21 ENCOUNTER — Encounter (HOSPITAL_COMMUNITY)
Admission: RE | Admit: 2021-03-21 | Discharge: 2021-03-21 | Disposition: A | Discharge status: Home / Self Care | Payer: Medicare Other | Source: Ambulatory Visit | Attending: Internal Medicine | Admitting: Internal Medicine

## 2021-03-21 ENCOUNTER — Encounter (HOSPITAL_COMMUNITY): Payer: Self-pay

## 2021-03-21 ENCOUNTER — Other Ambulatory Visit: Payer: Self-pay

## 2021-03-21 VITALS — BP 134/76 | HR 83 | Temp 98.2°F | Resp 18 | Ht 71.0 in | Wt 285.0 lb

## 2021-03-21 DIAGNOSIS — Z01812 Encounter for preprocedural laboratory examination: Secondary | ICD-10-CM | POA: Diagnosis not present

## 2021-03-21 DIAGNOSIS — K7460 Unspecified cirrhosis of liver: Secondary | ICD-10-CM

## 2021-03-21 LAB — COMPREHENSIVE METABOLIC PANEL
ALT: 63 U/L — ABNORMAL HIGH (ref 0–44)
AST: 69 U/L — ABNORMAL HIGH (ref 15–41)
Albumin: 3.6 g/dL (ref 3.5–5.0)
Alkaline Phosphatase: 153 U/L — ABNORMAL HIGH (ref 38–126)
Anion gap: 8 (ref 5–15)
BUN: 13 mg/dL (ref 6–20)
CO2: 22 mmol/L (ref 22–32)
Calcium: 8.5 mg/dL — ABNORMAL LOW (ref 8.9–10.3)
Chloride: 105 mmol/L (ref 98–111)
Creatinine, Ser: 0.81 mg/dL (ref 0.61–1.24)
GFR, Estimated: >60 mL/min (ref >60)
Glucose, Bld: 394 mg/dL — ABNORMAL HIGH (ref 70–99)
Potassium: 3.6 mmol/L (ref 3.5–5.1)
Sodium: 135 mmol/L (ref 135–145)
Total Bilirubin: 1.5 mg/dL — ABNORMAL HIGH (ref 0.3–1.2)
Total Protein: 6.8 g/dL (ref 6.5–8.1)

## 2021-03-21 LAB — CBC WITH DIFFERENTIAL/PLATELET
Abs Immature Granulocytes: 0 10*3/uL (ref 0.00–0.07)
Basophils Absolute: 0 10*3/uL (ref 0.0–0.1)
Basophils Relative: 1 %
Eosinophils Absolute: 0.1 10*3/uL (ref 0.0–0.5)
Eosinophils Relative: 2 %
HCT: 43.4 % (ref 39.0–52.0)
Hemoglobin: 14.8 g/dL (ref 13.0–17.0)
Immature Granulocytes: 0 %
Lymphocytes Relative: 30 %
Lymphs Abs: 0.8 10*3/uL (ref 0.7–4.0)
MCH: 31 pg (ref 26.0–34.0)
MCHC: 34.1 g/dL (ref 30.0–36.0)
MCV: 90.8 fL (ref 80.0–100.0)
Monocytes Absolute: 0.2 10*3/uL (ref 0.1–1.0)
Monocytes Relative: 7 %
Neutro Abs: 1.7 10*3/uL (ref 1.7–7.7)
Neutrophils Relative %: 60 %
Platelets: 51 10*3/uL — ABNORMAL LOW (ref 150–400)
RBC: 4.78 MIL/uL (ref 4.22–5.81)
RDW: 12.6 % (ref 11.5–15.5)
WBC: 2.8 10*3/uL — ABNORMAL LOW (ref 4.0–10.5)
nRBC: 0 % (ref 0.0–0.2)

## 2021-03-21 LAB — PROTIME-INR
INR: 1.2 (ref 0.8–1.2)
Prothrombin Time: 15.4 seconds — ABNORMAL HIGH (ref 11.4–15.2)

## 2021-03-21 NOTE — Pre-Procedure Instructions (Signed)
        glucose Received: Today Encarnacion Chu, RN  Eloise Harman, DO UP:BDHDIXBOE, Wayne Both, CMA Good afternoon! [name] [name] came for his PAT this afternoon and has a glucose of 394. He is on Jardiance 10 QD and ozempic 1 mg on Sunday's. I attempted to contact patient so that he was aware and his wife answered the phone. She told me in conversation that he has had mental cloudiness, fatigue, polyuria, uncoordination for about 1 month. She states the company that sends his glucose srips keep sending the wrong ones despite multiple calls to them and he has not been checking glucoses at home because of this. We talked at length of importance of controlling diabetes and she does understand this. I was going to call his PCP and get an appointment for him but [name] [name], his wife, wanted to call herself. I just wanted to keep you in the loop about all of this. Of course, we will do a fasting CBG the morning of his procedure.

## 2021-03-22 ENCOUNTER — Ambulatory Visit (HOSPITAL_COMMUNITY)
Admission: RE | Admit: 2021-03-22 | Discharge: 2021-03-22 | Disposition: A | Discharge status: Home / Self Care | Payer: Medicare Other | Source: Ambulatory Visit | Attending: Internal Medicine | Admitting: Internal Medicine

## 2021-03-22 DIAGNOSIS — K769 Liver disease, unspecified: Secondary | ICD-10-CM | POA: Diagnosis present

## 2021-03-22 DIAGNOSIS — K760 Fatty (change of) liver, not elsewhere classified: Secondary | ICD-10-CM | POA: Diagnosis not present

## 2021-03-22 LAB — AFP TUMOR MARKER: AFP, Serum, Tumor Marker: 6 ng/mL (ref 0.0–8.4)

## 2021-03-22 IMAGING — MR MR ABDOMEN WO/W CM
20 series · 47 of 48 positions shown · IV contrast (gadavist)
Comparison: Recent abdominal sonogram.

CLINICAL DATA: History of cirrhosis with liver lesion. 55-year-old
male with recent abnormal sonogram of the liver

EXAM:
MRI ABDOMEN WITHOUT AND WITH CONTRAST
TECHNIQUE: Multiplanar multisequence MR imaging of the abdomen was performed
both before and after the administration of intravenous contrast.
CONTRAST:  10mL GADAVIST GADOBUTROL 1 MMOL/ML IV SOLN

[Series 3: cor haste · coronal · 6.0mm · 1.41mm/px · 2 of 34 slices shown]
[im 1/34]
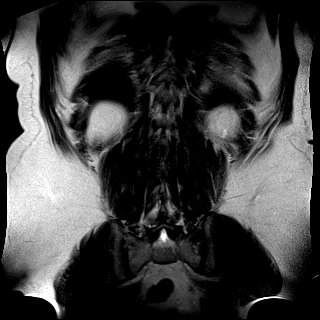
[im 34/34]
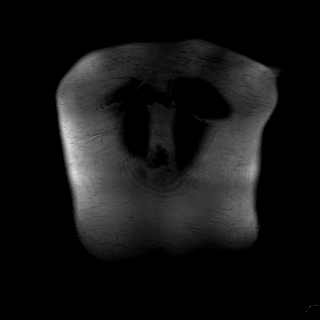

[Series 6: T2 fat-sat · axial · 6.0mm · 1.41mm/px · z∈[-147,+98]mm · 2 of 35 slices shown]
[im 1/35]
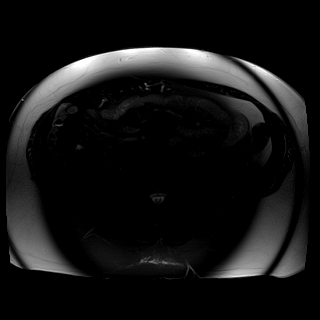
[im 35/35]
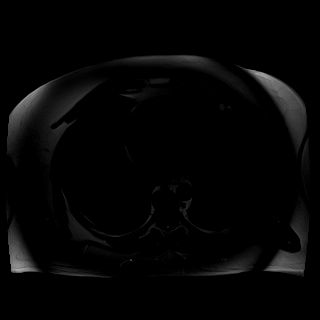

[Series 7: DWI · axial · 6.0mm · 1.68mm/px · z∈[-150,+95]mm · 2 of 35 slices shown (1 of 4)]
[im 1/35]
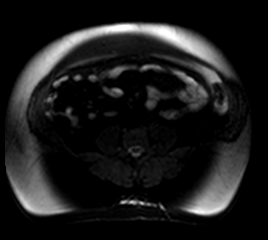
[im 35/35]
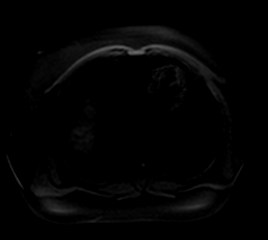

[Series 7: DWI · axial · 6.0mm · 1.68mm/px · z∈[-150,+95]mm · 2 of 35 slices shown (2 of 4)]
[im 1/35]
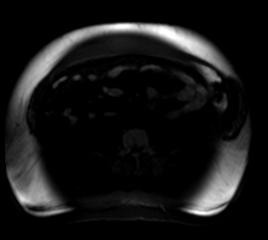
[im 35/35]
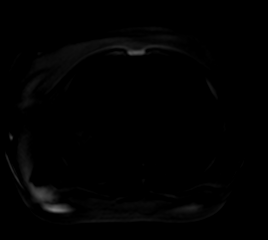

[Series 7: DWI · axial · 6.0mm · 1.68mm/px · 1 of 35 slices shown (3 of 4)]
[im 1/35]
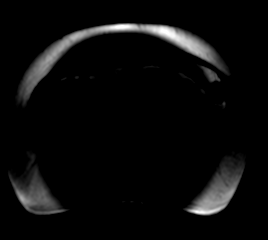

[Series 8: DWI · axial · 6.0mm · 1.68mm/px · 1 of 35 slices shown (4 of 4)]
[im 1/35]
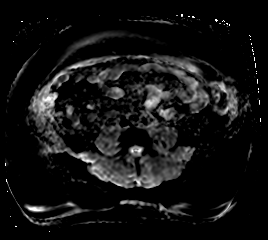

[Series 9: bSSFP · axial · 6.0mm · 0.88mm/px · 1 of 35 slices shown]
[im 1/35]
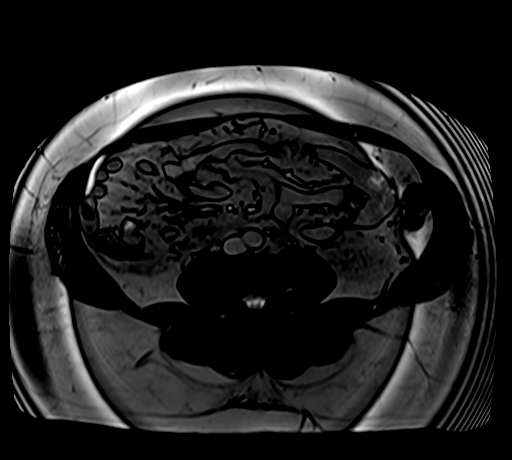

[Series 10: ax in and · axial · 3.0mm · 1.41mm/px · z∈[-169,+92]mm · 3 of 88 slices shown (1 of 2)]
[im 1/88]
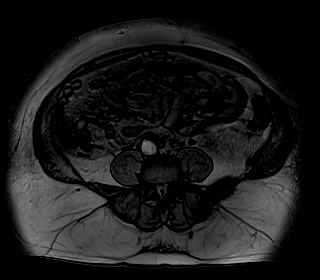
[im 44/88]
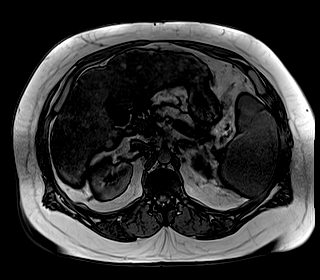
[im 88/88]
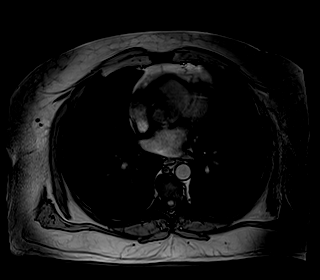

[Series 11: ax in and · axial · 3.0mm · 1.41mm/px · z∈[-169,+92]mm · 3 of 88 slices shown (2 of 2)]
[im 1/88]
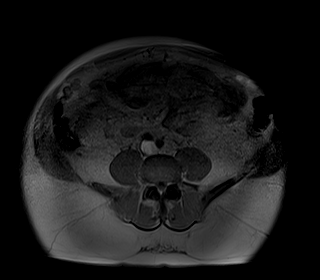
[im 44/88]
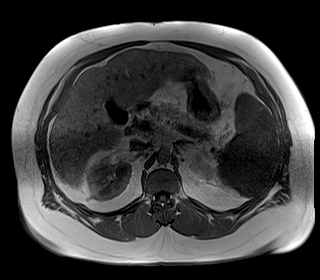
[im 88/88]
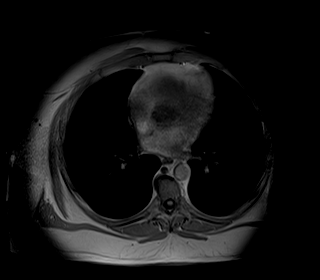

[Series 12: ax haste bh · axial · 6.0mm · 1.41mm/px · 1 of 36 slices shown]
[im 1/36]
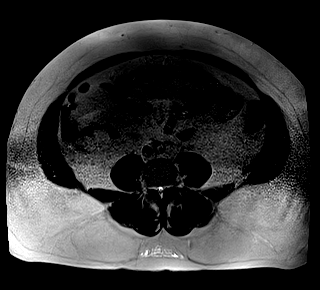

[Series 14: T1 dynamic · axial · 3.0mm · 1.41mm/px · z∈[-146,+91]mm · 3 of 80 slices shown (1 of 9)]
[im 1/80]
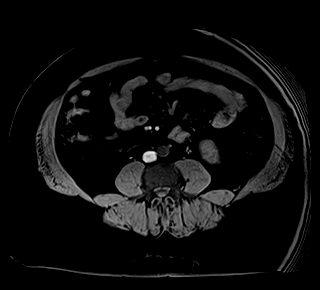
[im 40/80]
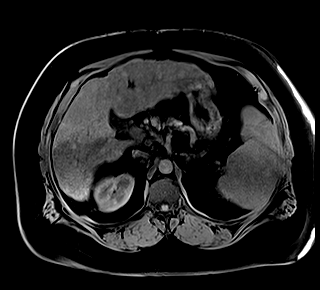
[im 80/80]
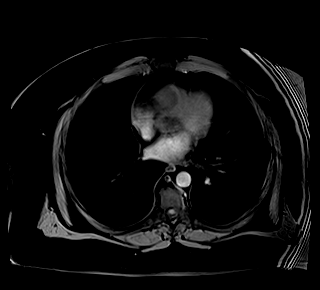

[Series 15: T1 dynamic · axial · 3.0mm · 1.41mm/px · z∈[-146,+91]mm · 3 of 80 slices shown (2 of 9)]
[im 1/80]
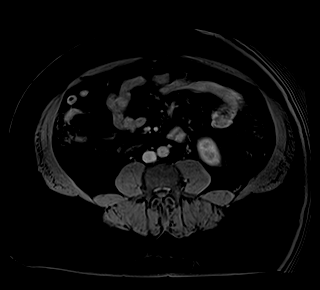
[im 40/80]
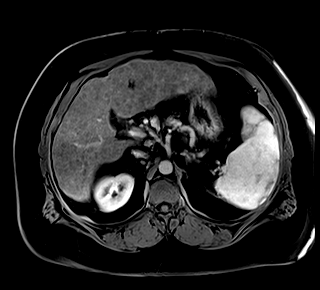
[im 80/80]
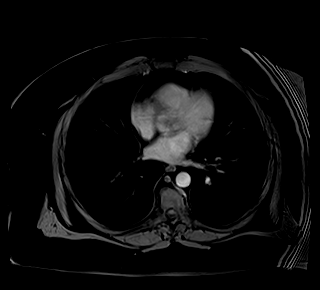

[Series 16: T1 dynamic · axial · 3.0mm · 1.41mm/px · z∈[-146,+91]mm · 3 of 80 slices shown (3 of 9)]
[im 1/80]
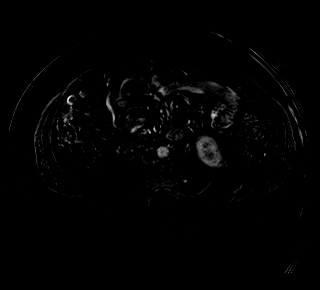
[im 40/80]
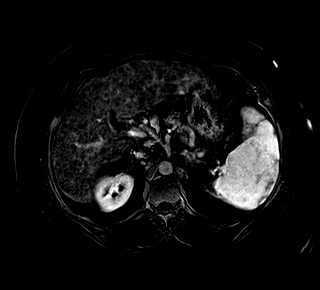
[im 80/80]
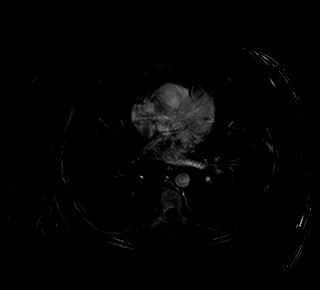

[Series 17: T1 dynamic · axial · 3.0mm · 1.41mm/px · z∈[-146,+91]mm · 3 of 80 slices shown (4 of 9)]
[im 1/80]
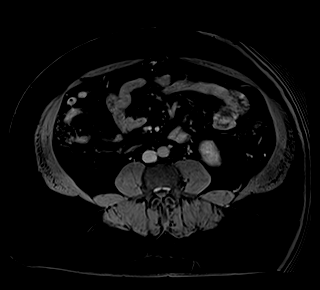
[im 40/80]
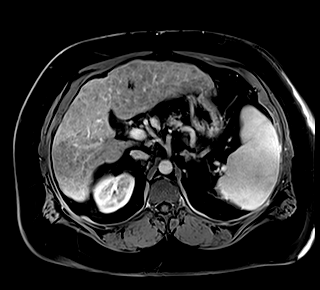
[im 80/80]
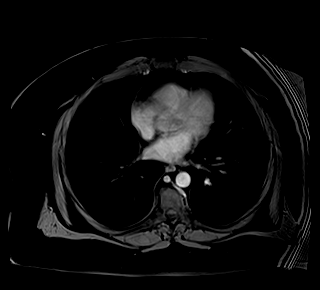

[Series 18: T1 dynamic · axial · 3.0mm · 1.41mm/px · z∈[-146,+91]mm · 3 of 80 slices shown (5 of 9)]
[im 1/80]
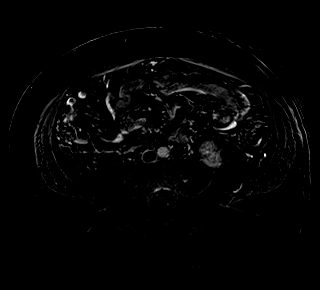
[im 40/80]
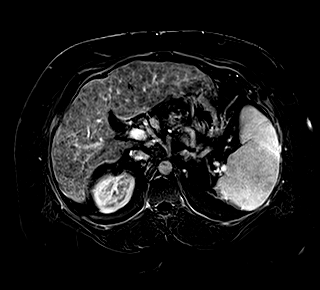
[im 80/80]
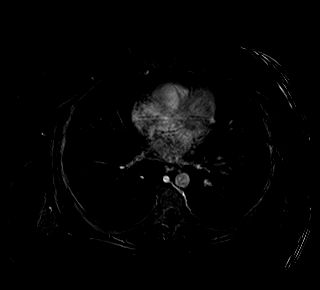

[Series 19: T1 dynamic · axial · 3.0mm · 1.41mm/px · z∈[-146,+91]mm · 3 of 80 slices shown (6 of 9)]
[im 1/80]
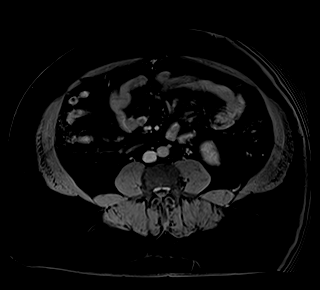
[im 40/80]
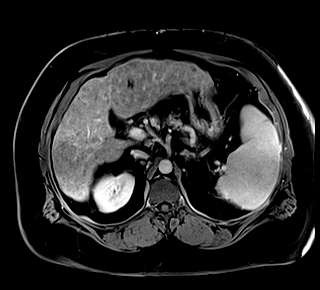
[im 80/80]
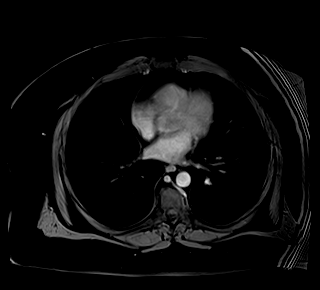

[Series 20: T1 dynamic · axial · 3.0mm · 1.41mm/px · z∈[-146,+91]mm · 3 of 80 slices shown (7 of 9)]
[im 1/80]
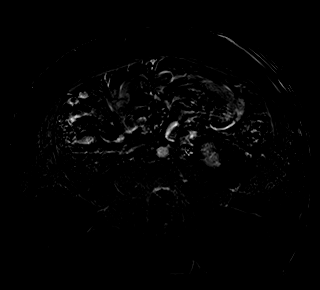
[im 40/80]
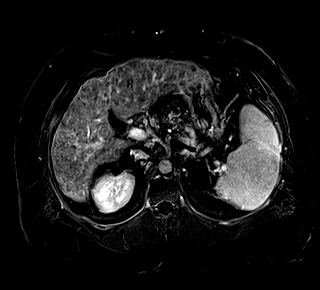
[im 80/80]
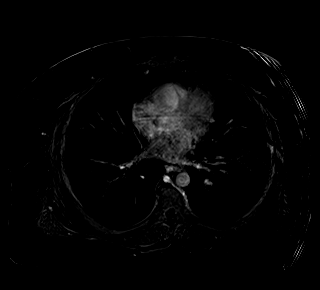

[Series 21: T1 dynamic · axial · 3.0mm · 1.41mm/px · z∈[-146,+91]mm · 3 of 80 slices shown (8 of 9)]
[im 1/80]
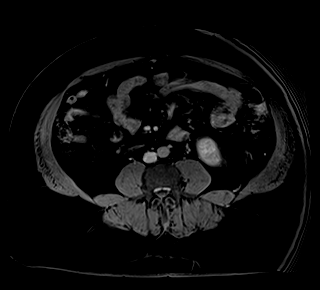
[im 40/80]
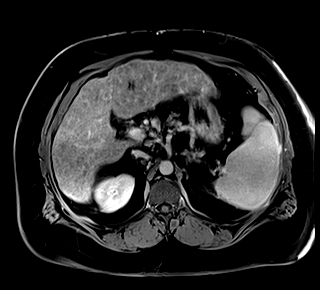
[im 80/80]
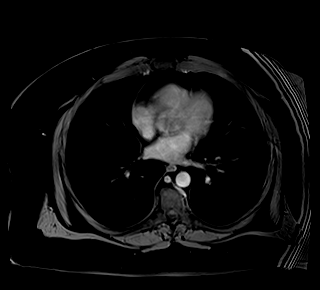

[Series 22: T1 dynamic · axial · 3.0mm · 1.41mm/px · z∈[-146,+91]mm · 3 of 80 slices shown (9 of 9)]
[im 1/80]
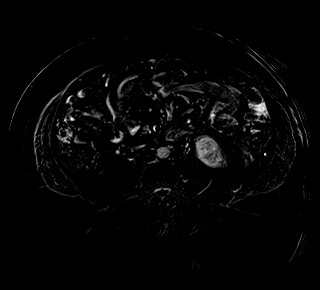
[im 40/80]
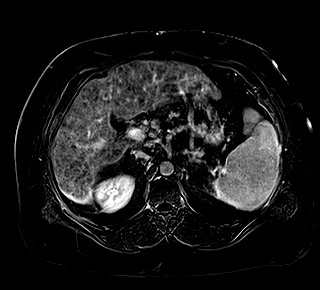
[im 80/80]
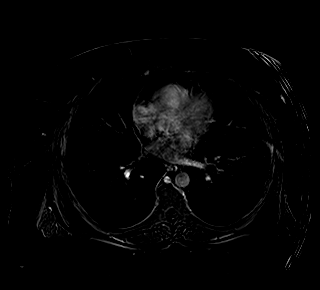

[Series 23: T1 dynamic post-contrast · coronal · 3.0mm · 1.41mm/px · 2 of 80 slices shown]
[im 1/80]
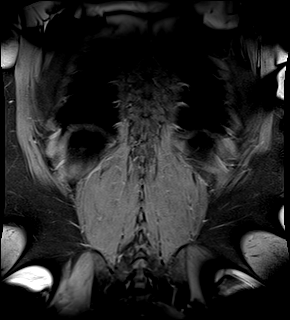
[im 40/80]
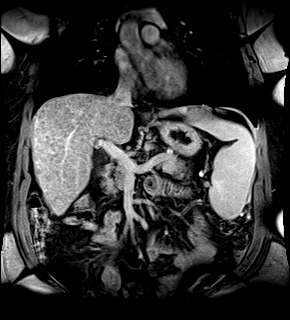

[47 of 48 positions shown; findings below may reference images not displayed]

FINDINGS: Lower chest: No signs of effusion or consolidation, limited
assessment of the lung bases on MRI.

Hepatobiliary: Numerous T2 hypointense lesions in the liver, liver
displaying a nodular contour. Areas of intrinsic T1 hyperintensity
as well on a background of marked hepatic cirrhosis.

No biliary duct dilation. No pericholecystic stranding. Portal vein
is patent. Hepatic veins are patent.

Areas of fatty metamorphosis within some of these areas of
nodularity

Observation 1: Fatty metamorphosis in the lesion on image 24 of
series 10 a 15 mm lesion which does not display signs of enhancement
or washout. EBADAT category 3.

Observation 2: Along the RIGHT hepatic margin is a 13 mm area also
with fatty metamorphosis within adjacent area of enhancement
measuring 5 mm without signs of washout. In aggregate EBADAT
category 3.

Area of fatty metamorphosis in the posterior RIGHT hepatic lobe
measuring 2.5 x 2.3 cm (image 33/10) at the boundary of hepatic
subsegment VI/VII. Area does not display hyperenhancement. EBADAT
category 3

Pancreas:  Normal, without mass, inflammation or ductal dilatation.

Spleen: Spleen enlarged approximately 17.8 cm greatest craniocaudal
dimension.

Adrenals/Urinary Tract: Adrenal glands are normal. There is
symmetric renal enhancement with a cyst in the upper pole the LEFT
kidney that measures 2.8 cm. No hydronephrosis. No perinephric
stranding.

Stomach/Bowel: No acute gastrointestinal process. Perigastric and
periesophageal varices.

Vascular/Lymphatic: No pathologically enlarged lymph nodes
identified. No abdominal aortic aneurysm demonstrated. Mildly
enlarged periportal lymph nodes could be seen in the setting of
liver disease. Retroaortic LEFT renal vein. No splenorenal shunting.

Other:  No ascites.

Musculoskeletal: No suspicious bone lesions identified.
IMPRESSION: 1. EBADAT category 3 lesions in the liver. Marked cirrhosis is
noted with numerous steatotic nodules. Suggest 3 month follow-up MRI
with and without contrast as an initial follow-up interval.
2. Stigmata of portal hypertension with marked splenomegaly.
3. Upper abdominal portosystemic collaterals.

## 2021-03-22 MED ORDER — GADOBUTROL 1 MMOL/ML IV SOLN
10.0000 mL | Freq: Once | INTRAVENOUS | Status: AC | PRN
  Administered 2021-03-22: 10 mL via INTRAVENOUS

## 2021-03-27 ENCOUNTER — Ambulatory Visit (HOSPITAL_COMMUNITY)
Admission: RE | Admit: 2021-03-27 | Discharge: 2021-03-27 | Disposition: A | Discharge status: Home / Self Care | Payer: Medicare Other | Attending: Internal Medicine | Admitting: Internal Medicine

## 2021-03-27 ENCOUNTER — Ambulatory Visit (HOSPITAL_COMMUNITY): Payer: Medicare Other | Admitting: Anesthesiology

## 2021-03-27 ENCOUNTER — Encounter (HOSPITAL_COMMUNITY)
Admission: RE | Disposition: A | Discharge status: Home / Self Care | Payer: Self-pay | Source: Home / Self Care | Attending: Internal Medicine

## 2021-03-27 ENCOUNTER — Encounter (HOSPITAL_COMMUNITY): Payer: Self-pay

## 2021-03-27 DIAGNOSIS — K5730 Diverticulosis of large intestine without perforation or abscess without bleeding: Secondary | ICD-10-CM | POA: Diagnosis not present

## 2021-03-27 DIAGNOSIS — D125 Benign neoplasm of sigmoid colon: Secondary | ICD-10-CM | POA: Diagnosis not present

## 2021-03-27 DIAGNOSIS — Z1211 Encounter for screening for malignant neoplasm of colon: Secondary | ICD-10-CM | POA: Diagnosis present

## 2021-03-27 DIAGNOSIS — K7460 Unspecified cirrhosis of liver: Secondary | ICD-10-CM | POA: Diagnosis not present

## 2021-03-27 DIAGNOSIS — K635 Polyp of colon: Secondary | ICD-10-CM

## 2021-03-27 DIAGNOSIS — Z86010 Personal history of colonic polyps: Secondary | ICD-10-CM | POA: Diagnosis not present

## 2021-03-27 DIAGNOSIS — Z79899 Other long term (current) drug therapy: Secondary | ICD-10-CM | POA: Diagnosis not present

## 2021-03-27 DIAGNOSIS — K648 Other hemorrhoids: Secondary | ICD-10-CM | POA: Diagnosis not present

## 2021-03-27 DIAGNOSIS — Z87891 Personal history of nicotine dependence: Secondary | ICD-10-CM | POA: Diagnosis not present

## 2021-03-27 DIAGNOSIS — K2970 Gastritis, unspecified, without bleeding: Secondary | ICD-10-CM | POA: Diagnosis not present

## 2021-03-27 DIAGNOSIS — K449 Diaphragmatic hernia without obstruction or gangrene: Secondary | ICD-10-CM | POA: Diagnosis not present

## 2021-03-27 HISTORY — PX: COLONOSCOPY WITH PROPOFOL: SHX5780

## 2021-03-27 HISTORY — PX: POLYPECTOMY: SHX5525

## 2021-03-27 HISTORY — PX: BIOPSY: SHX5522

## 2021-03-27 HISTORY — PX: ESOPHAGOGASTRODUODENOSCOPY (EGD) WITH PROPOFOL: SHX5813

## 2021-03-27 LAB — GLUCOSE, CAPILLARY
Glucose-Capillary: 148 mg/dL — ABNORMAL HIGH (ref 70–99)
Glucose-Capillary: 103 mg/dL — ABNORMAL HIGH (ref 70–99)

## 2021-03-27 SURGERY — COLONOSCOPY WITH PROPOFOL
Anesthesia: General

## 2021-03-27 MED ORDER — LACTATED RINGERS IV SOLN: INTRAVENOUS | Status: DC

## 2021-03-27 MED ORDER — LIDOCAINE HCL (CARDIAC) PF 100 MG/5ML IV SOSY
PREFILLED_SYRINGE | INTRAVENOUS | Status: DC | PRN
  Administered 2021-03-27: 50 mg via INTRAVENOUS

## 2021-03-27 MED ORDER — STERILE WATER FOR IRRIGATION IR SOLN
Status: DC | PRN
  Administered 2021-03-27: 5 mL

## 2021-03-27 MED ORDER — PROPOFOL 500 MG/50ML IV EMUL
INTRAVENOUS | Status: DC | PRN
  Administered 2021-03-27: 150 ug/kg/min via INTRAVENOUS

## 2021-03-27 MED ORDER — PROPOFOL 10 MG/ML IV BOLUS
INTRAVENOUS | Status: DC | PRN
  Administered 2021-03-27: 100 mg via INTRAVENOUS
  Administered 2021-03-27: 50 mg via INTRAVENOUS
  Administered 2021-03-27: 30 mg via INTRAVENOUS

## 2021-03-27 NOTE — Op Note (Signed)
[facility] Patient Name: [name] [name] Procedure Date: 03/27/2021 1:42 PM MRN: [id number] Date of Birth: [birth year] Attending MD: Elon Alas. Abbey Chatters DO CSN: [id number] Age: 55 Admit Type: Outpatient Procedure:                Colonoscopy Indications:              Surveillance: Personal history of adenomatous                            polyps on last colonoscopy 5 years ago Providers:                Elon Alas. Abbey Chatters, DO, Lambert Mody, Lurline Del, RN, Nelma Rothman, Technician Referring MD:              Medicines:                See the Anesthesia note for documentation of the                            administered medications Complications:            No immediate complications. Estimated Blood Loss:     Estimated blood loss was minimal. Procedure:                Pre-Anesthesia Assessment:                           - The anesthesia plan was to use monitored                            anesthesia care (MAC).                           After obtaining informed consent, the colonoscope                            was passed under direct vision. Throughout the                            procedure, the patient's blood pressure, pulse, and                            oxygen saturations were monitored continuously. The                            [id number]) was introduced through the anus                            and advanced to the the cecum, identified by                            appendiceal orifice and ileocecal valve. The                            colonoscopy was performed without difficulty.  The                            patient tolerated the procedure well. The quality                            of the bowel preparation was evaluated using the                            BBPS [facility] Bowel Preparation Scale) with scores                            of: Right Colon = 2 (minor amount of residual                            staining, small  fragments of stool and/or opaque                            liquid, but mucosa seen well), Transverse Colon = 2                            (minor amount of residual staining, small fragments                            of stool and/or opaque liquid, but mucosa seen                            well) and Left Colon = 2 (minor amount of residual                            staining, small fragments of stool and/or opaque                            liquid, but mucosa seen well). The total BBPS score                            equals 6. The quality of the bowel preparation was                            fair. Scope In: 1:46:31 PM Scope Out: 2:02:51 PM Scope Withdrawal Time: 0 hours 10 minutes 54 seconds  Total Procedure Duration: 0 hours 16 minutes 20 seconds  Findings:      The perianal and digital rectal examinations were normal.      Non-bleeding internal hemorrhoids were found during endoscopy.      Multiple small and large-mouthed diverticula were found in the sigmoid       colon and descending colon.      A 5 mm polyp was found in the sigmoid colon. The polyp was sessile. The       polyp was removed with a cold snare. Resection and retrieval were       complete.      The exam was otherwise without abnormality. Impression:               -  Preparation of the colon was fair.                           - Non-bleeding internal hemorrhoids.                           - Diverticulosis in the sigmoid colon and in the                            descending colon.                           - One 5 mm polyp in the sigmoid colon, removed with                            a cold snare. Resected and retrieved.                           - The examination was otherwise normal. Moderate Sedation:      Per Anesthesia Care Recommendation:           - Patient has a contact number available for                            emergencies. The signs and symptoms of potential                            delayed  complications were discussed with the                            patient. Return to normal activities tomorrow.                            Written discharge instructions were provided to the                            patient.                           - Resume previous diet.                           - Continue present medications.                           - Await pathology results.                           - Repeat colonoscopy in 5 years for surveillance.                           - Return to GI clinic in 3 months. Procedure Code(s):        --- Professional ---                           [phone], Colonoscopy, flexible; with removal of  tumor(s), polyp(s), or other lesion(s) by snare                            technique Diagnosis Code(s):        --- Professional ---                           Z86.010, Personal history of colonic polyps                           K64.8, Other hemorrhoids                           K63.5, Polyp of colon                           K57.30, Diverticulosis of large intestine without                            perforation or abscess without bleeding CPT copyright 2019 [employer]. All rights reserved. The codes documented in this report are preliminary and upon coder review may  be revised to meet current compliance requirements. Elon Alas. Abbey Chatters, DO [name], DO 03/27/2021 2:07:41 PM This report has been signed electronically. Number of Addenda: 0

## 2021-03-27 NOTE — H&P (Signed)
Primary Care Physician:  Jani Gravel, MD Primary Gastroenterologist:  Dr. Abbey Chatters  Pre-Procedure History & Physical: HPI:  [name] [name] is a 55 y.o. male is here for an EGD for variceal screening and a colonoscopy to be performed for surveillance purposes due to personal history of adenomatous colon polyps.   Past Medical History:  Diagnosis Date   Arthritis    Cirrhosis ([place])    Depression    Umbilical hernia     Past Surgical History:  Procedure Laterality Date   COLONOSCOPY N/A 05/14/2016   Procedure: COLONOSCOPY;  Surgeon: Danie Binder, MD;  Location: [facility];  Service: Endoscopy;  Laterality: N/A;  1000 - moved to 1:30 - office to notify pt   ESOPHAGOGASTRODUODENOSCOPY N/A 11/01/2015   Procedure: ESOPHAGOGASTRODUODENOSCOPY (EGD);  Surgeon: Danie Binder, MD;  Location: [facility];  Service: Endoscopy;  Laterality: N/A;  245 - moved to 2/1 - office to notify    FOOT SURGERY Right    club foot repair   HAND SURGERY Right    tendon repair   KNEE SURGERY Right     Prior to Admission medications   Medication Sig Start Date End Date Taking? Authorizing Provider  atorvastatin (LIPITOR) 10 MG tablet Take 10 mg by mouth daily.   Yes [provider]  empagliflozin (JARDIANCE) 10 MG TABS tablet Take 10 mg by mouth daily.   Yes [provider]  Semaglutide (OZEMPIC, 1 MG/DOSE, [place]) Inject 1 mg into the skin every Sunday.   Yes [provider]    Allergies as of 02/23/2021   (No Known Allergies)    Family History  Problem Relation Age of Onset   Colon cancer Neg Hx    Colon polyps Neg Hx    Stomach cancer Neg Hx    Pancreatic cancer Neg Hx     Social History   Socioeconomic History   Marital status: Married    Spouse name: Not on file   Number of children: Not on file   Years of education: Not on file   Highest education level: Not on file  Occupational History   Not on file  Tobacco Use   Smoking status: Former    Packs/day:  0.25    Years: 10.00    Pack years: 2.50    Types: Cigarettes    Quit date: 02/27/1999    Years since quitting: 22.0   Smokeless tobacco: Never  Vaping Use   Vaping Use: Never used  Substance and Sexual Activity   Alcohol use: No   Drug use: No   Sexual activity: Never  Other Topics Concern   Not on file  Social History Narrative   Not on file   Social Determinants of Health   Financial Resource Strain: Not on file  Food Insecurity: Not on file  Transportation Needs: Not on file  Physical Activity: Not on file  Stress: Not on file  Social Connections: Not on file  Intimate Partner Violence: Not on file    Review of Systems: See HPI, otherwise negative ROS  Physical Exam: Vital signs in last 24 hours: Temp:  [98.6 F (37 C)] 98.6 F (37 C) (06/28 1234) Pulse Rate:  [77] 77 (06/28 1234) Resp:  [15] 15 (06/28 1234) BP: (137)/(81) 137/81 (06/28 1234) SpO2:  [95 %] 95 % (06/28 1234)   General:   Alert,  Well-developed, well-nourished, pleasant and cooperative in NAD Head:  Normocephalic and atraumatic. Eyes:  Sclera clear, no icterus.  Conjunctiva pink. Ears:  Normal auditory acuity. Nose:  No deformity, discharge,  or lesions. Mouth:  No deformity or lesions, dentition normal. Neck:  Supple; no masses or thyromegaly. Lungs:  Clear throughout to auscultation.   No wheezes, crackles, or rhonchi. No acute distress. Heart:  Regular rate and rhythm; no murmurs, clicks, rubs,  or gallops. Abdomen:  Soft, nontender and nondistended. No masses, hepatosplenomegaly or hernias noted. Normal bowel sounds, without guarding, and without rebound.   Msk:  Symmetrical without gross deformities. Normal posture. Extremities:  Without clubbing or edema. Neurologic:  Alert and  oriented x4;  grossly normal neurologically. Skin:  Intact without significant lesions or rashes. Cervical Nodes:  No significant cervical adenopathy. Psych:  Alert and cooperative. Normal mood and  affect.  Impression/Plan: [name] [name] is here for an EGD for variceal screening and a colonoscopy to be performed for surveillance purposes due to personal history af adenomatous colon polyps.   The risks of the procedure including infection, bleed, or perforation as well as benefits, limitations, alternatives and imponderables have been reviewed with the patient. Questions have been answered. All parties agreeable.

## 2021-03-27 NOTE — Anesthesia Preprocedure Evaluation (Addendum)
Anesthesia Evaluation  Patient identified by MRN, date of birth, ID band Patient awake    Reviewed: Allergy & Precautions, NPO status , Patient's Chart, lab work & pertinent test results  History of Anesthesia Complications Negative for: history of anesthetic complications  Airway Mallampati: I  TM Distance: >3 FB Neck ROM: Full    Dental  (+) Dental Advisory Given, Caps   Pulmonary former smoker,    Pulmonary exam normal breath sounds clear to auscultation       Cardiovascular Exercise Tolerance: Good Normal cardiovascular exam Rhythm:Regular Rate:Normal      Neuro/Psych PSYCHIATRIC DISORDERS Depression    GI/Hepatic negative GI ROS, (+) Cirrhosis       ,   Endo/Other  negative endocrine ROS  Renal/GU negative Renal ROS     Musculoskeletal  (+) Arthritis ,   Abdominal   Peds  Hematology  (+) Blood dyscrasia (thrombocytopenia), ,   Anesthesia Other Findings IMPRESSION: 1. LI-RADS category 3 lesions in the liver. Marked cirrhosis is noted with numerous steatotic nodules. Suggest 3 month follow-up MRI with and without contrast as an initial follow-up interval. 2. Stigmata of portal hypertension with marked splenomegaly. 3. Upper abdominal portosystemic collaterals.   Electronically Signed   By: Zetta Bills M.D.   On: 03/22/2021 19:17  Reproductive/Obstetrics negative OB ROS                           Anesthesia Physical Anesthesia Plan  ASA: 3  Anesthesia Plan: General   Post-op Pain Management:    Induction: Intravenous  PONV Risk Score and Plan: Propofol infusion  Airway Management Planned: Nasal Cannula and Natural Airway  Additional Equipment:   Intra-op Plan:   Post-operative Plan:   Informed Consent: I have reviewed the patients History and Physical, chart, labs and discussed the procedure including the risks, benefits and alternatives for the proposed  anesthesia with the patient or authorized representative who has indicated his/her understanding and acceptance.     Dental advisory given  Plan Discussed with: CRNA and Surgeon  Anesthesia Plan Comments:         Anesthesia Quick Evaluation

## 2021-03-27 NOTE — Discharge Instructions (Addendum)
EGD Discharge instructions Please read the instructions outlined below and refer to this sheet in the next few weeks. These discharge instructions provide you with general information on caring for yourself after you leave the hospital. Your doctor may also give you specific instructions. While your treatment has been planned according to the most current medical practices available, unavoidable complications occasionally occur. If you have any problems or questions after discharge, please call your doctor. ACTIVITY You may resume your regular activity but move at a slower pace for the next 24 hours.  Take frequent rest periods for the next 24 hours.  Walking will help expel (get rid of) the air and reduce the bloated feeling in your abdomen.  No driving for 24 hours (because of the anesthesia (medicine) used during the test).  You may shower.  Do not sign any important legal documents or operate any machinery for 24 hours (because of the anesthesia used during the test).  NUTRITION Drink plenty of fluids.  You may resume your normal diet.  Begin with a light meal and progress to your normal diet.  Avoid alcoholic beverages for 24 hours or as instructed by your caregiver.  MEDICATIONS You may resume your normal medications unless your caregiver tells you otherwise.  WHAT YOU CAN EXPECT TODAY You may experience abdominal discomfort such as a feeling of fullness or "gas" pains.  FOLLOW-UP Your doctor will discuss the results of your test with you.  SEEK IMMEDIATE MEDICAL ATTENTION IF ANY OF THE FOLLOWING OCCUR: Excessive nausea (feeling sick to your stomach) and/or vomiting.  Severe abdominal pain and distention (swelling).  Trouble swallowing.  Temperature over 101 F (37.8 C).  Rectal bleeding or vomiting of blood.     Colonoscopy Discharge Instructions  Read the instructions outlined below and refer to this sheet in the next few weeks. These discharge instructions provide you with  general information on caring for yourself after you leave the hospital. Your doctor may also give you specific instructions. While your treatment has been planned according to the most current medical practices available, unavoidable complications occasionally occur.   ACTIVITY You may resume your regular activity, but move at a slower pace for the next 24 hours.  Take frequent rest periods for the next 24 hours.  Walking will help get rid of the air and reduce the bloated feeling in your belly (abdomen).  No driving for 24 hours (because of the medicine (anesthesia) used during the test).   Do not sign any important legal documents or operate any machinery for 24 hours (because of the anesthesia used during the test).  NUTRITION Drink plenty of fluids.  You may resume your normal diet as instructed by your doctor.  Begin with a light meal and progress to your normal diet. Heavy or fried foods are harder to digest and may make you feel sick to your stomach (nauseated).  Avoid alcoholic beverages for 24 hours or as instructed.  MEDICATIONS You may resume your normal medications unless your doctor tells you otherwise.  WHAT YOU CAN EXPECT TODAY Some feelings of bloating in the abdomen.  Passage of more gas than usual.  Spotting of blood in your stool or on the toilet paper.  IF YOU HAD POLYPS REMOVED DURING THE COLONOSCOPY: No aspirin products for 7 days or as instructed.  No alcohol for 7 days or as instructed.  Eat a soft diet for the next 24 hours.  FINDING OUT THE RESULTS OF YOUR TEST Not all test results are  available during your visit. If your test results are not back during the visit, make an appointment with your caregiver to find out the results. Do not assume everything is normal if you have not heard from your caregiver or the medical facility. It is important for you to follow up on all of your test results.  SEEK IMMEDIATE MEDICAL ATTENTION IF: You have more than a spotting of  blood in your stool.  Your belly is swollen (abdominal distention).  You are nauseated or vomiting.  You have a temperature over 101.  You have abdominal pain or discomfort that is severe or gets worse throughout the day.   Breath of esophageal varices on your upper endoscopy.  You did have a lot of inflammation your stomach so I took biopsies of this.  Avoid NSAIDs as best as you can.  Await pathology results, my office will contact you.  We will plan a repeat EGD in 2 years.  Your colonoscopy revealed 1 polyp(s) which I removed successfully. Await pathology results, my office will contact you. I recommend repeating colonoscopy in 5 years for surveillance purposes. You also have diverticulosis and internal hemorrhoids. I would recommend increasing fiber in your diet or adding OTC Benefiber/Metamucil. Be sure to drink at least 4 to 6 glasses of water daily.   Follow-up with GI 3 months   I hope you have a great rest of your week!  Elon Alas. Abbey Chatters, D.O. Gastroenterology and Hepatology [facility]

## 2021-03-27 NOTE — Op Note (Signed)
[facility] Patient Name: [name] [name] Procedure Date: 03/27/2021 1:14 PM MRN: [id number] Date of Birth: [birth year] Attending MD: Elon Alas. Edgar Frisk CSN: [id number] Age: 55 Admit Type: Outpatient Procedure:                Upper GI endoscopy Indications:              Cirrhosis rule out esophageal varices Providers:                Elon Alas. Abbey Chatters, DO, Lambert Mody, Lurline Del, RN, Nelma Rothman, Technician Referring MD:              Medicines:                See the Anesthesia note for documentation of the                            administered medications Complications:            No immediate complications. Estimated Blood Loss:     Estimated blood loss was minimal. Procedure:                Pre-Anesthesia Assessment:                           - The anesthesia plan was to use monitored                            anesthesia care (MAC).                           After obtaining informed consent, the endoscope was                            passed under direct vision. Throughout the                            procedure, the patient's blood pressure, pulse, and                            oxygen saturations were monitored continuously. The                            GIF-H190 ([id number]) was introduced through the                            mouth, and advanced to the second part of duodenum.                            The upper GI endoscopy was accomplished without                            difficulty. The patient tolerated the procedure                            well. Scope  In: 1:37:10 PM Scope Out: 1:41:03 PM Total Procedure Duration: 0 hours 3 minutes 53 seconds  Findings:      There is no endoscopic evidence of varices in the entire esophagus.      A small hiatal hernia was present.      Diffuse moderate inflammation characterized by erosions, erythema and       shallow ulcerations was found in the entire examined stomach. Biopsies        were taken with a cold forceps for Helicobacter pylori testing.      The duodenal bulb, first portion of the duodenum and second portion of       the duodenum were normal. Impression:               - Small hiatal hernia.                           - Gastritis. Biopsied.                           - Normal duodenal bulb, first portion of the                            duodenum and second portion of the duodenum. Moderate Sedation:      Per Anesthesia Care Recommendation:           - Patient has a contact number available for                            emergencies. The signs and symptoms of potential                            delayed complications were discussed with the                            patient. Return to normal activities tomorrow.                            Written discharge instructions were provided to the                            patient.                           - Resume previous diet.                           - Continue present medications.                           - Await pathology results.                           - Repeat upper endoscopy in 2 years for screening                            purposes.                           -  Return to GI clinic in 3 months. Procedure Code(s):        --- Professional ---                           [phone], Esophagogastroduodenoscopy, flexible,                            transoral; with biopsy, single or multiple Diagnosis Code(s):        --- Professional ---                           K44.9, Diaphragmatic hernia without obstruction or                            gangrene                           K29.70, Gastritis, unspecified, without bleeding                           K74.60, Unspecified cirrhosis of liver CPT copyright 2019 American Medical Association. All rights reserved. The codes documented in this report are preliminary and upon coder review may  be revised to meet current compliance requirements. Elon Alas. Abbey Chatters,  DO [place] Abbey Chatters, DO 03/27/2021 1:44:37 PM This report has been signed electronically. Number of Addenda: 0

## 2021-03-27 NOTE — Anesthesia Procedure Notes (Signed)
Date/Time: 03/27/2021 1:39 PM Performed by: Orlie Dakin, CRNA Pre-anesthesia Checklist: Patient identified, Emergency Drugs available, Suction available and Patient being monitored Patient Re-evaluated:Patient Re-evaluated prior to induction Oxygen Delivery Method: Nasal cannula Induction Type: IV induction Placement Confirmation: positive ETCO2

## 2021-03-27 NOTE — Transfer of Care (Signed)
Immediate Anesthesia Transfer of Care Note  Patient: [name] [name]  Procedure(s) Performed: COLONOSCOPY WITH PROPOFOL ESOPHAGOGASTRODUODENOSCOPY (EGD) WITH PROPOFOL BIOPSY POLYPECTOMY  Patient Location: Short Stay  Anesthesia Type:General  Level of Consciousness: drowsy  Airway & Oxygen Therapy: Patient Spontanous Breathing  Post-op Assessment: Report given to RN and Post -op Vital signs reviewed and stable  Post vital signs: Reviewed and stable  Last Vitals:  Vitals Value Taken Time  BP    Temp    Pulse    Resp    SpO2      Last Pain:  Vitals:   03/27/21 1332  TempSrc:   PainSc: 0-No pain      Patients Stated Pain Goal: 6 (73/54/30 1484)  Complications: No notable events documented.

## 2021-03-27 NOTE — Anesthesia Postprocedure Evaluation (Signed)
Anesthesia Post Note  Patient: [name] [name]  Procedure(s) Performed: COLONOSCOPY WITH PROPOFOL ESOPHAGOGASTRODUODENOSCOPY (EGD) WITH PROPOFOL BIOPSY POLYPECTOMY  Patient location during evaluation: Phase II Anesthesia Type: General Level of consciousness: awake and alert and oriented Pain management: pain level controlled Vital Signs Assessment: post-procedure vital signs reviewed and stable Respiratory status: spontaneous breathing and respiratory function stable Cardiovascular status: blood pressure returned to baseline and stable Postop Assessment: no apparent nausea or vomiting Anesthetic complications: no   No notable events documented.   Last Vitals:  Vitals:   03/27/21 1408 03/27/21 1413  BP: (!) 104/59   Pulse: 76   Resp: 16   Temp: 36.6 C   SpO2:  95%    Last Pain:  Vitals:   03/27/21 1413  TempSrc:   PainSc: 0-No pain                 Jarissa Sheriff C Katrinka Herbison

## 2021-03-29 LAB — SURGICAL PATHOLOGY

## 2021-04-03 ENCOUNTER — Encounter (HOSPITAL_COMMUNITY): Payer: Self-pay | Admitting: Internal Medicine

## 2021-04-27 LAB — PROTIME-INR
INR: 1.1 (ref 0.9–1.2)
Prothrombin Time: 11.4 s (ref 9.1–12.0)

## 2021-04-27 LAB — COMPREHENSIVE METABOLIC PANEL
ALT: 72 IU/L — ABNORMAL HIGH (ref 0–44)
AST: 95 IU/L — ABNORMAL HIGH (ref 0–40)
Albumin/Globulin Ratio: 1.3 (ref 1.2–2.2)
Albumin: 4 g/dL (ref 3.8–4.9)
Alkaline Phosphatase: 199 IU/L — ABNORMAL HIGH (ref 44–121)
BUN/Creatinine Ratio: 12 (ref 9–20)
BUN: 10 mg/dL (ref 6–24)
Bilirubin Total: 0.8 mg/dL (ref 0.0–1.2)
CO2: 21 mmol/L (ref 20–29)
Calcium: 8.7 mg/dL (ref 8.7–10.2)
Chloride: 105 mmol/L (ref 96–106)
Creatinine, Ser: 0.82 mg/dL (ref 0.76–1.27)
Globulin, Total: 3.1 g/dL (ref 1.5–4.5)
Glucose: 182 mg/dL — ABNORMAL HIGH (ref 65–99)
Potassium: 3.9 mmol/L (ref 3.5–5.2)
Sodium: 142 mmol/L (ref 134–144)
Total Protein: 7.1 g/dL (ref 6.0–8.5)
eGFR: 104 mL/min/{1.73_m2} (ref >59)

## 2021-04-27 LAB — AFP TUMOR MARKER: AFP, Serum, Tumor Marker: 7.2 ng/mL (ref 0.0–8.4)

## 2021-05-31 ENCOUNTER — Encounter: Payer: Self-pay | Admitting: Internal Medicine

## 2021-06-25 ENCOUNTER — Telehealth: Payer: Self-pay | Admitting: Internal Medicine

## 2021-06-25 NOTE — Telephone Encounter (Signed)
Please schedule patient for office visit. Okay to use one of my 130 spots. Please do not cancel his appt in January. He needs visit before then to discuss his abnormal liver labs. Thank you

## 2021-07-04 ENCOUNTER — Ambulatory Visit (INDEPENDENT_AMBULATORY_CARE_PROVIDER_SITE_OTHER): Payer: Medicare Other | Admitting: Internal Medicine

## 2021-07-04 ENCOUNTER — Other Ambulatory Visit: Payer: Self-pay

## 2021-07-04 ENCOUNTER — Encounter: Payer: Self-pay | Admitting: *Deleted

## 2021-07-04 ENCOUNTER — Encounter: Payer: Self-pay | Admitting: Internal Medicine

## 2021-07-04 VITALS — BP 131/80 | HR 76 | Temp 97.5°F | Ht 71.0 in | Wt 283.8 lb

## 2021-07-04 DIAGNOSIS — K769 Liver disease, unspecified: Secondary | ICD-10-CM

## 2021-07-04 DIAGNOSIS — K7460 Unspecified cirrhosis of liver: Secondary | ICD-10-CM

## 2021-07-04 DIAGNOSIS — D126 Benign neoplasm of colon, unspecified: Secondary | ICD-10-CM

## 2021-07-04 DIAGNOSIS — R7989 Other specified abnormal findings of blood chemistry: Secondary | ICD-10-CM

## 2021-07-04 NOTE — Patient Instructions (Signed)
I am going to recheck your liver function tests today as well as rule out other possible causes of cirrhosis besides fatty liver disease.  We will order MRI to reevaluate lesion seen on your liver.  Follow-up in 3 months.  It was great seeing you again today.  Dr. Abbey Chatters  At [facility] we value your feedback. You may receive a survey about your visit today. Please share your experience as we strive to create trusting relationships with our patients to provide genuine, compassionate, quality care.  We appreciate your understanding and patience as we review any laboratory studies, imaging, and other diagnostic tests that are ordered as we care for you. Our office policy is 5 business days for review of these results, and any emergent or urgent results are addressed in a timely manner for your best interest. If you do not hear from our office in 1 week, please contact us.   We also encourage the use of MyChart, which contains your medical information for your review as well. If you are not enrolled in this feature, an access code is on this after visit summary for your convenience. Thank you for allowing [place] to be involved in your care.  It was great to see you today!  I hope you have a great rest of your Fall!    [name] [name]. Abbey Chatters, D.O. Gastroenterology and Hepatology [facility]

## 2021-07-04 NOTE — Progress Notes (Signed)
Referring Provider: Jani Gravel, MD Primary Care Physician:  Jani Gravel, MD Primary GI:  Dr. Abbey Chatters  Chief Complaint  Patient presents with   Cirrhosis    HPI:   [name] [name] is a 55 y.o. male who presents to the clinic today for follow-up visit.  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   Risk factors for NASH include obesity, diabetes, dyslipidemia.  Has chronically elevated aminotransferases and alkaline phosphatase.  Patient denies any confusion or severe fatigue.  No melena hematochezia.  No abdominal or leg swelling.  Patient does not drink alcohol.  EGD 03/27/2021 without esophageal varices.  Gastric mucosa appeared inflamed but biopsies negative.  Colonoscopy 03/27/2021 with 1 tubular adenoma removed.  Recommended recall 02/2026.  Had an ultrasound 03/06/2021 for [facility] screening which showed potential liver lesion.  Subsequent MRI 03/22/2021 multiple LIRADS 3 lesions of the liver with recommended 28-monthfollow-up MRI.  Most recent AFP WNL  Today, patient states he is doing well.  Most recent liver tests showed AST 95, ALT 72, T bili 0.8, alk phos 199.  His wife CMalachy Moodwho normally accompanies him recently started a job as a sEnvironmental consultantat [employer]  Past Medical History:  Diagnosis Date   Arthritis    Cirrhosis [place]    Depression    Umbilical hernia     Past Surgical History:  Procedure Laterality Date   BIOPSY  03/27/2021   Procedure: BIOPSY;  Surgeon: CEloise Harman DO;  [facility];  Service: Endoscopy;;  gastric   COLONOSCOPY N/A 05/14/2016   Procedure: COLONOSCOPY;  Surgeon: SDanie Binder MD;  [facility]: [facility];  Service: Endoscopy;  Laterality: N/A;  1000 - moved to 1:30 - office to notify pt   COLONOSCOPY WITH PROPOFOL N/A 03/27/2021   Procedure: COLONOSCOPY WITH PROPOFOL;  Surgeon: CEloise Harman DO;  [facility];  Service: Endoscopy;  Laterality: N/A;  1:30pm    ESOPHAGOGASTRODUODENOSCOPY N/A 11/01/2015   Procedure: ESOPHAGOGASTRODUODENOSCOPY (EGD);  Surgeon: SDanie Binder MD;  [facility];  Service: Endoscopy;  Laterality: N/A;  245 - moved to 2/1 - office to notify    ESOPHAGOGASTRODUODENOSCOPY (EGD) WITH PROPOFOL N/A 03/27/2021   Procedure: ESOPHAGOGASTRODUODENOSCOPY (EGD) WITH PROPOFOL;  Surgeon: CEloise Harman DO;  [facility];  Service: Endoscopy;  Laterality: N/A;   FOOT SURGERY Right    club foot repair   HAND SURGERY Right    tendon repair   KNEE SURGERY Right    POLYPECTOMY  03/27/2021   Procedure: POLYPECTOMY;  Surgeon: CEloise Harman DO;  [facility];  Service: Endoscopy;;    Current Outpatient Medications  Medication Sig Dispense Refill   atorvastatin (LIPITOR) 10 MG tablet Take 10 mg by mouth daily.     JARDIANCE 25 MG TABS tablet Take 25 mg by mouth daily.     Semaglutide (OZEMPIC, 1 MG/DOSE, [place]) Inject 1 mg into the skin every Sunday.     empagliflozin (JARDIANCE) 10 MG TABS tablet Take 10 mg by mouth daily. (Patient not taking: Reported on 07/04/2021)     No current facility-administered medications for this visit.    Allergies as of 07/04/2021   (No Known Allergies)    Family History  Problem Relation Age of Onset   Colon cancer Neg Hx    Colon polyps Neg Hx    Stomach cancer Neg Hx    Pancreatic cancer Neg Hx  Social History   Socioeconomic History   Marital status: Married    Spouse name: Not on file   Number of children: Not on file   Years of education: Not on file   Highest education level: Not on file  Occupational History   Not on file  Tobacco Use   Smoking status: Former    Packs/day: 0.25    Years: 10.00    Pack years: 2.50    Types: Cigarettes    Quit date: 02/27/1999    Years since quitting: 22.3   Smokeless tobacco: Never  Vaping Use   Vaping Use: Never used  Substance and Sexual Activity   Alcohol use: No   Drug use: No   Sexual activity:  Never  Other Topics Concern   Not on file  Social History Narrative   Not on file   Social Determinants of Health   Financial Resource Strain: Not on file  Food Insecurity: Not on file  Transportation Needs: Not on file  Physical Activity: Not on file  Stress: Not on file  Social Connections: Not on file    Subjective: Review of Systems  Constitutional:  Negative for chills and fever.  HENT:  Negative for congestion and hearing loss.   Eyes:  Negative for blurred vision and double vision.  Respiratory:  Negative for cough and shortness of breath.   Cardiovascular:  Negative for chest pain and palpitations.  Gastrointestinal:  Negative for abdominal pain, blood in stool, constipation, diarrhea, heartburn, melena and vomiting.  Genitourinary:  Negative for dysuria and urgency.  Musculoskeletal:  Negative for joint pain and myalgias.  Skin:  Negative for itching and rash.  Neurological:  Negative for dizziness and headaches.  Psychiatric/Behavioral:  Negative for depression. The patient is not nervous/anxious.     Objective: BP 131/80   Pulse 76   Temp (!) 97.5 F (36.4 C) (Temporal)   Ht 5' 11" (1.803 m)   Wt 283 lb 12.8 oz (128.7 kg)   BMI 39.58 kg/m  Physical Exam Constitutional:      Appearance: Normal appearance. He is obese.  HENT:     Head: Normocephalic and atraumatic.  Eyes:     Extraocular Movements: Extraocular movements intact.     Conjunctiva/sclera: Conjunctivae normal.  Cardiovascular:     Rate and Rhythm: Normal rate and regular rhythm.  Pulmonary:     Effort: Pulmonary effort is normal.     Breath sounds: Normal breath sounds.  Abdominal:     General: Bowel sounds are normal.     Palpations: Abdomen is soft.  Musculoskeletal:        General: Normal range of motion.     Cervical back: Normal range of motion and neck supple.  Skin:    General: Skin is warm.  Neurological:     General: No focal deficit present.     Mental Status: He is alert  and oriented to person, place, and time.  Psychiatric:        Mood and Affect: Mood normal.        Behavior: Behavior normal.     Assessment: *Cirrhosis-likely due to NASH, well-compensated *Adenomatous colon polyps *Liver lesions  Plan: We will check updated meld labs today in clinic.  We will also perform full serological work-up to rule out other causes of underlying cirrhosis.  We will order repeat MRI with and without contrast to evaluate liver lesions.  No evidence of hypervolemia on exam today.  Counseled on low-sodium diet.  No  history of encephalopathy.  Repeat EGD June 2024 for variceal screening unless suffers a decompensating event.  Discussed NASH in depth with him today.  Counseled on the vast importance of keeping his diabetes and dyslipidemia under good control.  Also recommended weight loss of at least 10% which would be 29 pounds.  Recommended he institute 30 minutes of exercise 5 days a week.  States his biggest issue is with snacking throughout the day on his kids foods including butter fingers.  His wife states they actually eat healthy meals.  Follow-up in 3 months.   07/04/2021 4:58 PM   Disclaimer: This note was dictated with voice recognition software. Similar sounding words can inadvertently be transcribed and may not be corrected upon review.

## 2021-07-05 LAB — IRON,TIBC AND FERRITIN PANEL
Ferritin: 180 ng/mL (ref 30–400)
Iron Saturation: 23 % (ref 15–55)
Iron: 75 ug/dL (ref 38–169)
Total Iron Binding Capacity: 326 ug/dL (ref 250–450)
UIBC: 251 ug/dL (ref 111–343)

## 2021-07-05 LAB — MITOCHONDRIAL ANTIBODIES: Mitochondrial Ab: <20 Units (ref 0.0–20.0)

## 2021-07-05 LAB — ANTI-SMOOTH MUSCLE ANTIBODY, IGG: Smooth Muscle Ab: 7 Units (ref 0–19)

## 2021-07-11 LAB — COMPREHENSIVE METABOLIC PANEL
ALT: 61 IU/L — ABNORMAL HIGH (ref 0–44)
AST: 70 IU/L — ABNORMAL HIGH (ref 0–40)
Albumin/Globulin Ratio: 1.5 (ref 1.2–2.2)
Albumin: 4.3 g/dL (ref 3.8–4.9)
Alkaline Phosphatase: 164 IU/L — ABNORMAL HIGH (ref 44–121)
BUN/Creatinine Ratio: 14 (ref 9–20)
BUN: 11 mg/dL (ref 6–24)
Bilirubin Total: 0.8 mg/dL (ref 0.0–1.2)
CO2: 23 mmol/L (ref 20–29)
Calcium: 9.1 mg/dL (ref 8.7–10.2)
Chloride: 106 mmol/L (ref 96–106)
Creatinine, Ser: 0.8 mg/dL (ref 0.76–1.27)
Globulin, Total: 2.8 g/dL (ref 1.5–4.5)
Glucose: 176 mg/dL — ABNORMAL HIGH (ref 70–99)
Potassium: 3.6 mmol/L (ref 3.5–5.2)
Sodium: 143 mmol/L (ref 134–144)
Total Protein: 7.1 g/dL (ref 6.0–8.5)
eGFR: 105 mL/min/{1.73_m2} (ref >59)

## 2021-07-11 LAB — ANA: ANA Titer 1: NEGATIVE

## 2021-07-11 LAB — IMMUNOGLOBULINS A/E/G/M, SERUM
IgE (Immunoglobulin E), Serum: 49 IU/mL (ref 6–495)
IgG (Immunoglobin G), Serum: 1075 mg/dL (ref 603–1613)
IgM (Immunoglobulin M), Srm: 119 mg/dL (ref 20–172)

## 2021-07-11 LAB — HCV INTERPRETATION

## 2021-07-11 LAB — ACUTE VIRAL HEPATITIS (HAV, HBV, HCV)
HCV Ab: <0.1 s/co ratio (ref 0.0–0.9)
Hep A IgM: NEGATIVE
Hep B C IgM: NEGATIVE
Hepatitis B Surface Ag: NEGATIVE

## 2021-07-11 LAB — CELIAC AB TTG DGP TIGA
Antigliadin Abs, IgA: 6 units (ref 0–19)
Gliadin IgG: 2 units (ref 0–19)
IgA/Immunoglobulin A, Serum: 543 mg/dL — ABNORMAL HIGH (ref 90–386)
Tissue Transglut Ab: <2 U/mL (ref 0–5)
Transglutaminase IgA: <2 U/mL (ref 0–3)

## 2021-07-16 ENCOUNTER — Ambulatory Visit: Payer: Medicare Other | Admitting: Gastroenterology

## 2021-07-16 ENCOUNTER — Ambulatory Visit (HOSPITAL_COMMUNITY)
Admission: RE | Admit: 2021-07-16 | Discharge: 2021-07-16 | Disposition: A | Discharge status: Home / Self Care | Payer: Medicare Other | Source: Ambulatory Visit | Attending: Internal Medicine | Admitting: Internal Medicine

## 2021-07-16 ENCOUNTER — Other Ambulatory Visit: Payer: Self-pay

## 2021-07-16 DIAGNOSIS — R7989 Other specified abnormal findings of blood chemistry: Secondary | ICD-10-CM | POA: Insufficient documentation

## 2021-07-16 DIAGNOSIS — K7460 Unspecified cirrhosis of liver: Secondary | ICD-10-CM | POA: Insufficient documentation

## 2021-07-16 DIAGNOSIS — K769 Liver disease, unspecified: Secondary | ICD-10-CM | POA: Diagnosis present

## 2021-07-16 IMAGING — MR MR ABDOMEN WO/W CM
20 series · 48 of 48 positions shown · IV contrast (gadavist)
Comparison: Multiple exams, including [DATE]

CLINICAL DATA: Hepatic cirrhosis with QUIRIJN category 3 lesion
follow up.

EXAM:
MRI ABDOMEN WITHOUT AND WITH CONTRAST
TECHNIQUE: Multiplanar multisequence MR imaging of the abdomen was performed
both before and after the administration of intravenous contrast.
CONTRAST:  10mL GADAVIST GADOBUTROL 1 MMOL/ML IV SOLN

[Series 3: cor haste · coronal · 6.0mm · 1.25mm/px · 1 of 48 slices shown]
[im 1/48]
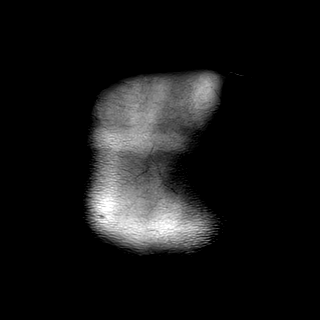

[Series 4: ax haste · axial · 6.0mm · 1.41mm/px · 1 of 45 slices shown]
[im 1/45]
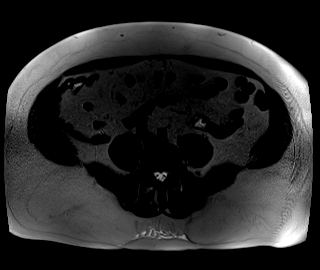

[Series 5: T2 fat-sat · axial · 6.0mm · 1.19mm/px · 1 of 42 slices shown]
[im 1/42]
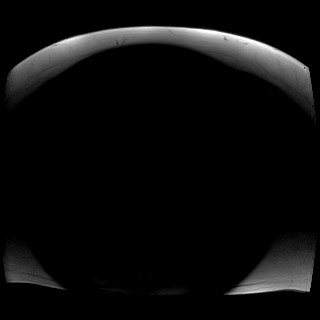

[Series 6: DWI · axial · 6.0mm · 1.68mm/px · 1 of 53 slices shown (1 of 4)]
[im 1/53]
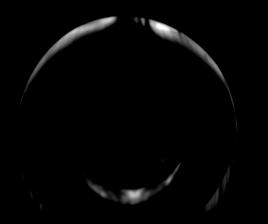

[Series 6: DWI · axial · 6.0mm · 1.68mm/px · z∈[-237,+137]mm · 2 of 53 slices shown (2 of 4)]
[im 1/53]
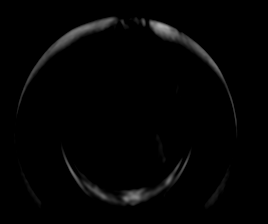
[im 53/53]
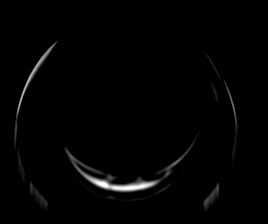

[Series 6: DWI · axial · 6.0mm · 1.68mm/px · z∈[-237,+137]mm · 2 of 53 slices shown (3 of 4)]
[im 1/53]
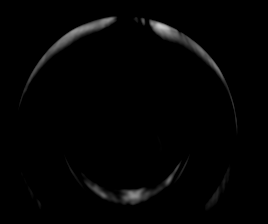
[im 53/53]
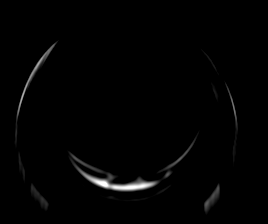

[Series 7: DWI · axial · 6.0mm · 1.68mm/px · z∈[-237,+137]mm · 2 of 53 slices shown (4 of 4)]
[im 1/53]
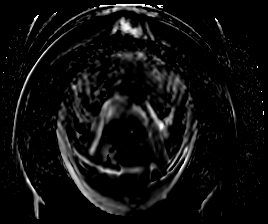
[im 53/53]
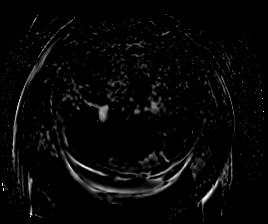

[Series 8: bSSFP · axial · 6.0mm · 0.88mm/px · z∈[-206,+106]mm · 2 of 53 slices shown]
[im 1/53]
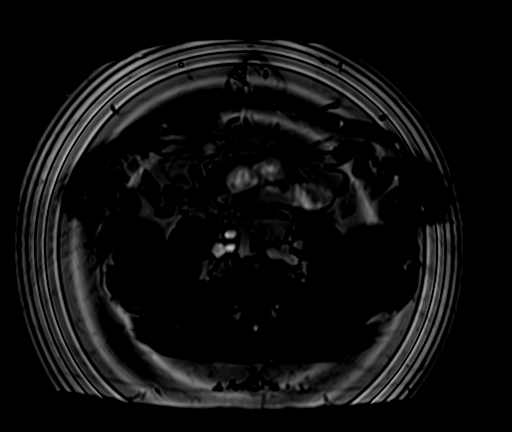
[im 53/53]
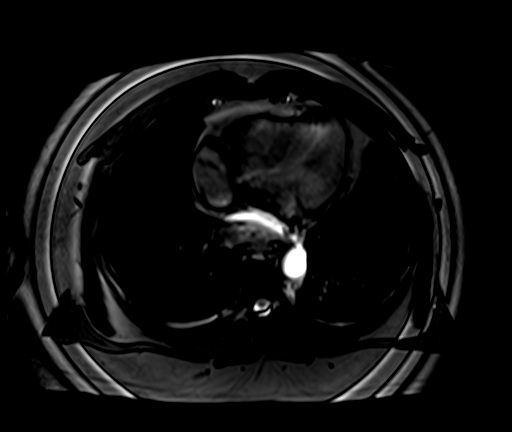

[Series 9: ax in and · axial · 3.0mm · 1.25mm/px · z∈[-159,+102]mm · 3 of 88 slices shown (1 of 2)]
[im 1/88]
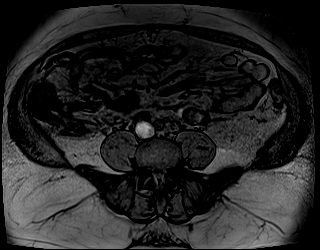
[im 44/88]
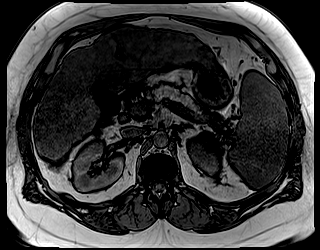
[im 88/88]
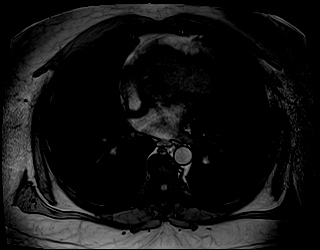

[Series 10: ax in and · axial · 3.0mm · 1.25mm/px · z∈[-159,+102]mm · 3 of 88 slices shown (2 of 2)]
[im 1/88]
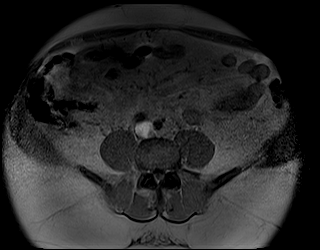
[im 44/88]
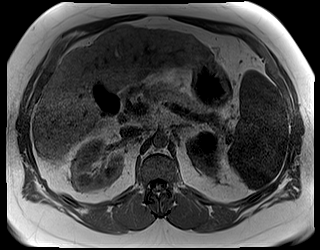
[im 88/88]
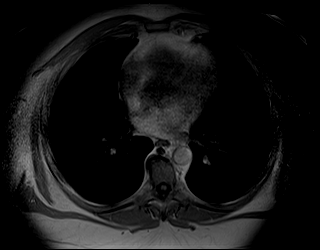

[Series 11: T1 dynamic · axial · non-contrast · 3.0mm · 1.25mm/px · z∈[-144,+117]mm · 3 of 88 slices shown (1 of 4)]
[im 1/88]
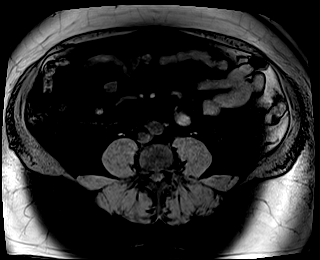
[im 44/88]
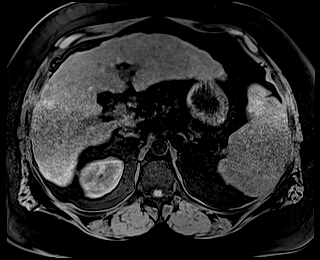
[im 88/88]
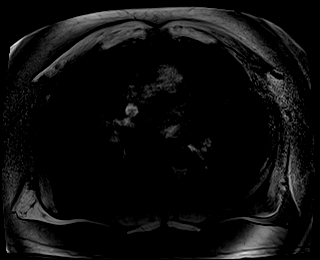

[Series 13: T1 dynamic post-contrast · axial · 3.0mm · 1.25mm/px · z∈[-144,+117]mm · 3 of 88 slices shown (1 of 6)]
[im 1/88]
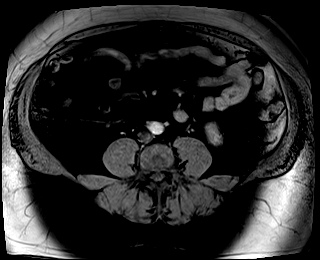
[im 44/88]
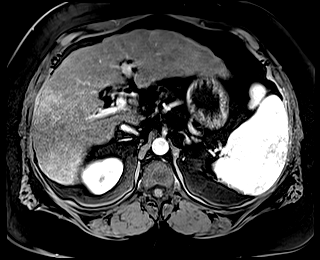
[im 88/88]
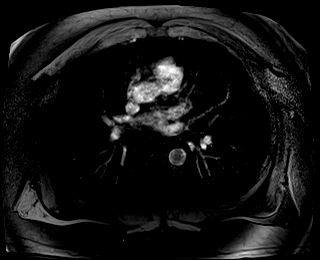

[Series 14: T1 dynamic · axial · 3.0mm · 1.25mm/px · z∈[-144,+117]mm · 3 of 88 slices shown (2 of 4)]
[im 1/88]
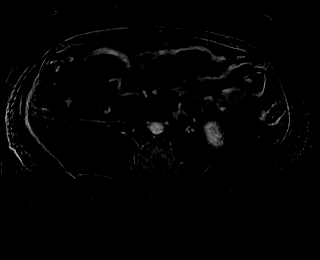
[im 44/88]
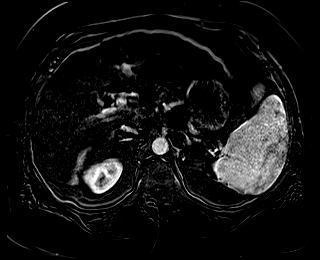
[im 88/88]
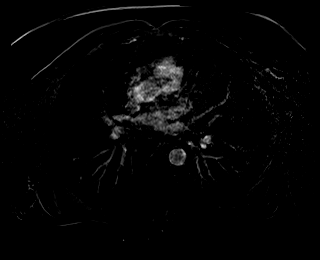

[Series 15: T1 dynamic post-contrast · axial · 3.0mm · 1.25mm/px · z∈[-144,+117]mm · 3 of 88 slices shown (2 of 6)]
[im 1/88]
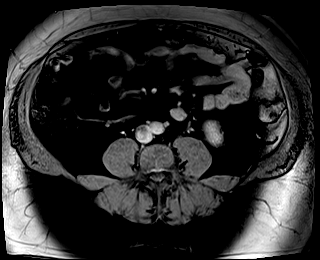
[im 44/88]
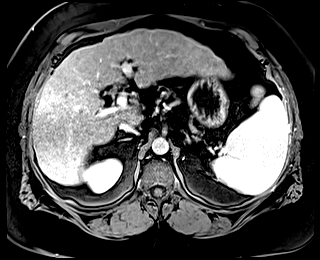
[im 88/88]
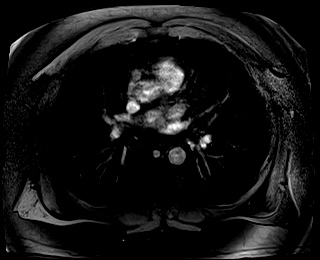

[Series 16: T1 dynamic · axial · 3.0mm · 1.25mm/px · z∈[-144,+117]mm · 3 of 88 slices shown (3 of 4)]
[im 1/88]
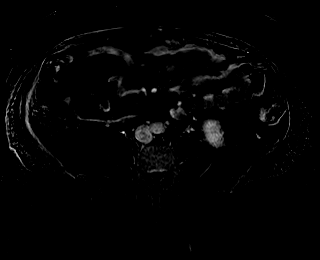
[im 44/88]
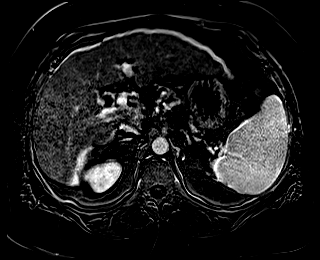
[im 88/88]
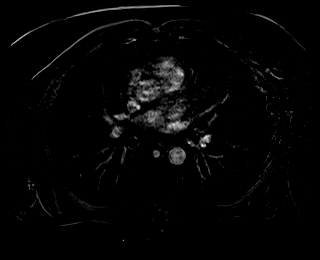

[Series 17: T1 dynamic post-contrast · axial · 3.0mm · 1.25mm/px · z∈[-144,+117]mm · 3 of 88 slices shown (3 of 6)]
[im 1/88]
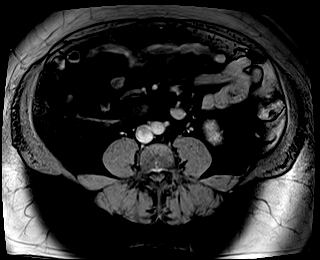
[im 44/88]
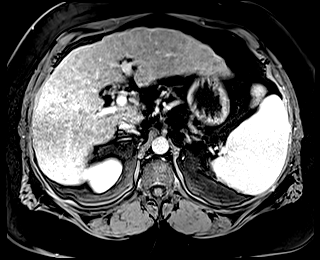
[im 88/88]
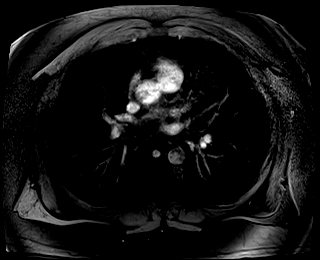

[Series 18: T1 dynamic · axial · 3.0mm · 1.25mm/px · z∈[-144,+117]mm · 3 of 88 slices shown (4 of 4)]
[im 1/88]
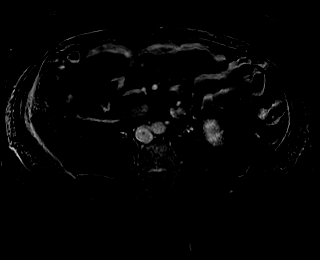
[im 44/88]
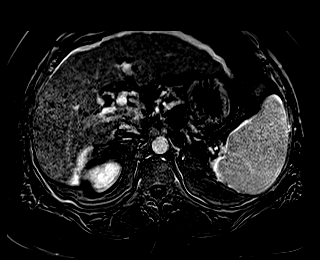
[im 88/88]
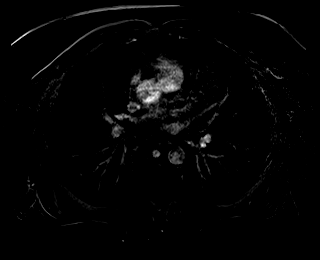

[Series 19: T1 dynamic post-contrast · coronal · 3.0mm · 1.19mm/px · 3 of 88 slices shown (4 of 6)]
[im 1/88]
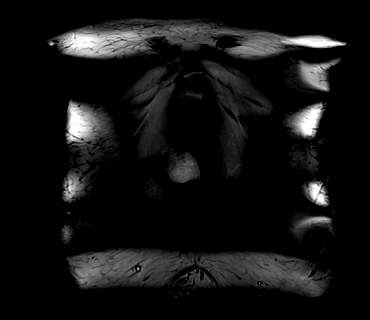
[im 44/88]
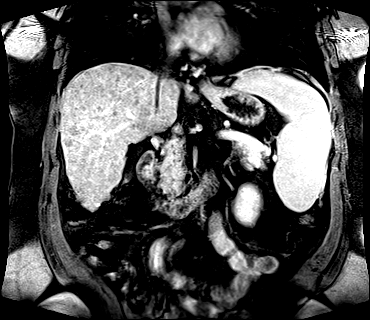
[im 88/88]
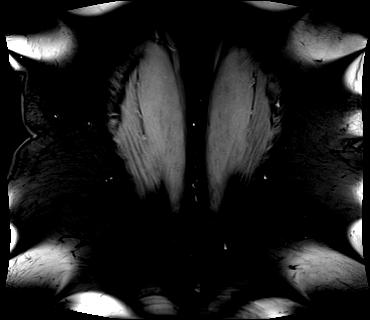

[Series 20: T1 dynamic post-contrast · axial · 3.0mm · 1.25mm/px · z∈[-144,+117]mm · 3 of 88 slices shown (5 of 6)]
[im 1/88]
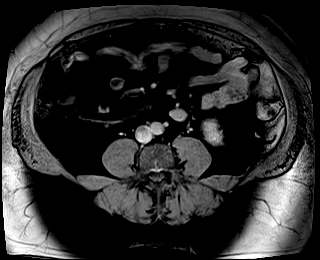
[im 44/88]
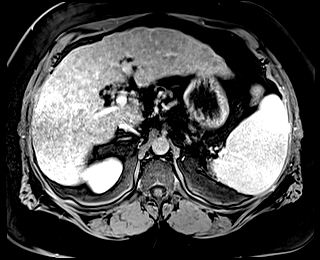
[im 88/88]
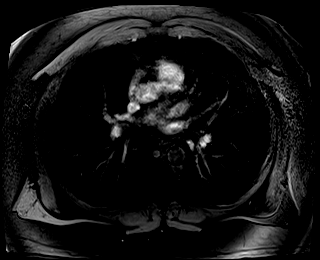

[Series 21: T1 dynamic post-contrast · axial · 3.0mm · 1.25mm/px · z∈[-144,+117]mm · 3 of 88 slices shown (6 of 6)]
[im 1/88]
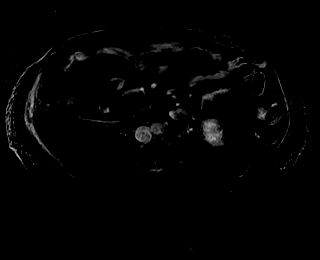
[im 44/88]
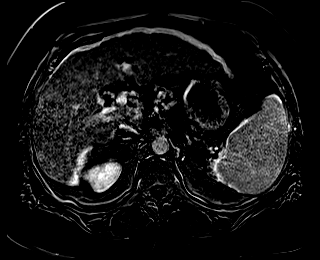
[im 88/88]
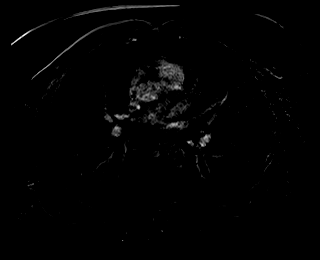

[48 of 48 positions shown; findings below may reference images not displayed]

FINDINGS: Despite efforts by the technologist and patient, motion artifact is
present on today's exam and could not be eliminated. This reduces
exam sensitivity and specificity.

Lower chest: Unremarkable

Hepatobiliary: Micro nodular hepatic cirrhosis and steatosis.

7 by 8 mm focus of non rim arterial phase enhancement peripherally
in the right hepatic lobe on image 53 series 13, previously the same
by my measurements on the [DATE] exam, this region appears
isodense on all other phases, without washout or capsule appearance
and without interval growth. QUIRIJN category LR 3.

No other significant focal hepatic lesions are identified.
Gallbladder unremarkable. No biliary dilatation.

Pancreas:  Unremarkable

Spleen: The spleen measures 14.1 by 7.1 by 17.9 cm (volume = 940
cm^3), compatible with splenomegaly. No significant focal splenic
lesion identified.

Adrenals/Urinary Tract: Stable Bosniak category 1 cyst of the left
kidney upper pole. The kidneys appear normal. Unremarkable adrenal
glands.

Stomach/Bowel: Unremarkable

Vascular/Lymphatic: Uphill paraesophageal varices. Reactive porta
hepatis lymph nodes.

Other:  No upper abdominal ascites.

Musculoskeletal: Umbilical hernia contains adipose tissue.
IMPRESSION: 1. 8 by 7 mm QUIRIJN category LR 3 lesion peripherally in the right
hepatic lobe. The appearance is stable by my measurements.
Surveillance imaging in 3-6 months time is recommended.
2. Micro nodular hepatic cirrhosis and steatosis.
3. Splenomegaly.  Uphill paraesophageal varices.
4. Umbilical hernia contains adipose tissue.

## 2021-07-16 MED ORDER — GADOBUTROL 1 MMOL/ML IV SOLN
10.0000 mL | Freq: Once | INTRAVENOUS | Status: AC | PRN
  Administered 2021-07-16: 10 mL via INTRAVENOUS

## 2021-10-23 ENCOUNTER — Ambulatory Visit: Payer: Medicare Other | Admitting: Gastroenterology

## 2021-10-25 ENCOUNTER — Telehealth: Payer: Self-pay | Admitting: Internal Medicine

## 2021-10-25 NOTE — Telephone Encounter (Signed)
Pt added to April 2023 recall

## 2021-10-25 NOTE — Telephone Encounter (Signed)
Recall for mri °

## 2021-10-25 NOTE — Telephone Encounter (Signed)
Okay to delay another 3 months for MRI.  Thank you

## 2021-10-25 NOTE — Telephone Encounter (Signed)
Per 06/2021 MRI " Oleh Genin, CMA  07/23/2021  4:36 PM EDT     Phoned and advised the pt of his result note (which he seen in Mychart). The pt does not want to be scheduled for MRI in 3 months (he states he is tired of doing them every 3 months he would rather stretched it out to maybe 5 or 6. Erline Levine depending on what Dr. Abbey Chatters states I will reach back out to you regarding which time frame to NIC it for  Please advise Dr. Abbey Chatters? thanks

## 2021-10-25 NOTE — Telephone Encounter (Signed)
Please NIC Manuela Schwartz thanks!

## 2022-01-10 ENCOUNTER — Telehealth: Payer: Self-pay | Admitting: Internal Medicine

## 2022-01-10 NOTE — Telephone Encounter (Signed)
Recall for mri °

## 2022-01-10 NOTE — Telephone Encounter (Signed)
Recall sent 

## 2022-01-30 ENCOUNTER — Telehealth: Payer: Self-pay | Admitting: Internal Medicine

## 2022-01-30 DIAGNOSIS — K7460 Unspecified cirrhosis of liver: Secondary | ICD-10-CM

## 2022-01-30 DIAGNOSIS — K769 Liver disease, unspecified: Secondary | ICD-10-CM

## 2022-01-30 NOTE — Telephone Encounter (Signed)
MRI Liver w/wo contrast scheduled for 02/11/22 at 4:00pm, arrive at 3:30pm. NPO 4 hours prior to test. ? ?Called pt, informed him of MRI appt. Letter mailed.  ? ?No PA needed for MRI liver per [facility] website. ?

## 2022-01-30 NOTE — Telephone Encounter (Signed)
Patient called to schedule his liver US [phone] ?

## 2022-02-11 ENCOUNTER — Ambulatory Visit: Payer: Medicare Other | Admitting: Gastroenterology

## 2022-02-11 ENCOUNTER — Ambulatory Visit (HOSPITAL_COMMUNITY)
Admission: RE | Admit: 2022-02-11 | Discharge: 2022-02-11 | Disposition: A | Discharge status: Home / Self Care | Payer: Medicare Other | Source: Ambulatory Visit | Attending: Internal Medicine | Admitting: Internal Medicine

## 2022-02-11 DIAGNOSIS — K7460 Unspecified cirrhosis of liver: Secondary | ICD-10-CM | POA: Insufficient documentation

## 2022-02-11 DIAGNOSIS — K769 Liver disease, unspecified: Secondary | ICD-10-CM | POA: Insufficient documentation

## 2022-02-11 IMAGING — MR MR ABDOMEN WO/W CM
18 series · 48 of 48 positions shown · IV contrast (gadavist)
Comparison: Multiple priors, most recently abdominal MRI
[DATE].

CLINICAL DATA: 56-year-old male with history of cirrhosis.
Follow-up evaluation for liver lesion.

EXAM:
MRI ABDOMEN WITHOUT AND WITH CONTRAST
TECHNIQUE: Multiplanar multisequence MR imaging of the abdomen was performed
both before and after the administration of intravenous contrast.
CONTRAST:  10mL GADAVIST GADOBUTROL 1 MMOL/ML IV SOLN

[Series 3: cor haste · coronal · 6.0mm · 1.41mm/px · 3 of 48 slices shown]
[im 1/48]
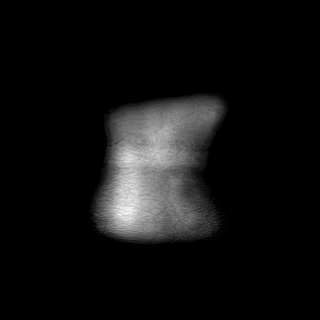
[im 24/48]
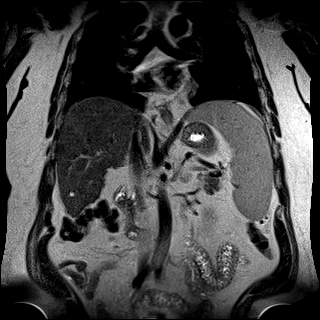
[im 48/48]
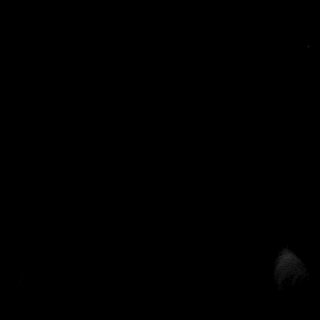

[Series 4: ax haste · axial · 6.0mm · 1.44mm/px · z∈[-85,+174]mm · 2 of 37 slices shown]
[im 1/37]
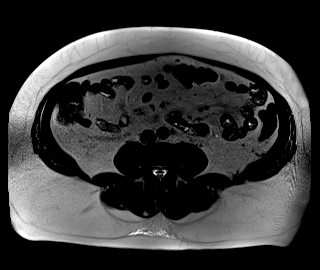
[im 37/37]
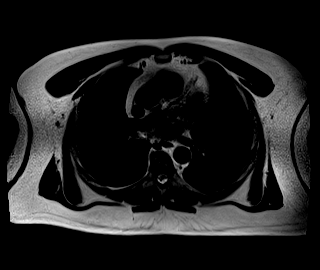

[Series 6: T2 fat-sat · axial · 6.0mm · 1.44mm/px · z∈[-85,+174]mm · 2 of 37 slices shown]
[im 1/37]
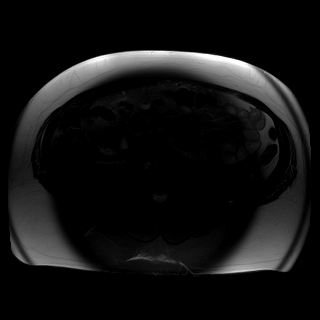
[im 37/37]
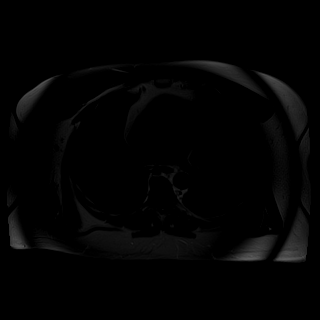

[Series 7: DWI · axial · 6.0mm · 1.72mm/px · z∈[-114,+203]mm · 3 of 45 slices shown (1 of 4)]
[im 1/45]
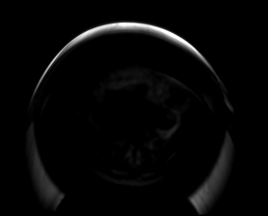
[im 23/45]
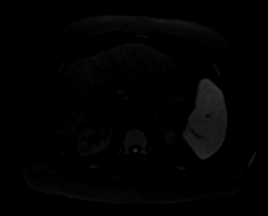
[im 45/45]
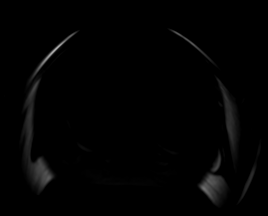

[Series 7: DWI · axial · 6.0mm · 1.72mm/px · z∈[-114,+203]mm · 2 of 45 slices shown (2 of 4)]
[im 1/45]
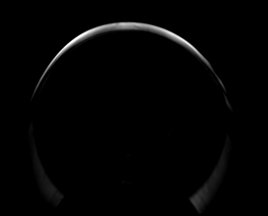
[im 45/45]
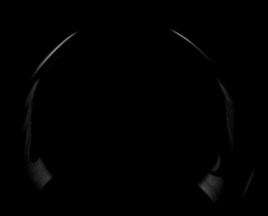

[Series 7: DWI · axial · 6.0mm · 1.72mm/px · z∈[-114,+203]mm · 2 of 45 slices shown (3 of 4)]
[im 1/45]
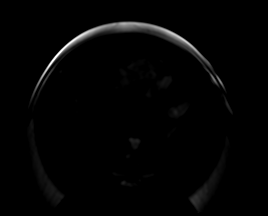
[im 45/45]
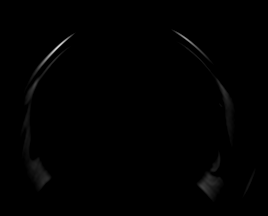

[Series 8: DWI · axial · 6.0mm · 1.72mm/px · z∈[-114,+203]mm · 2 of 45 slices shown (4 of 4)]
[im 1/45]
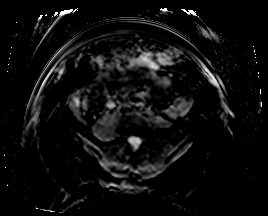
[im 45/45]
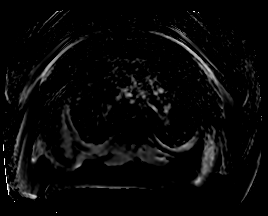

[Series 9: bSSFP · axial · 6.0mm · 0.90mm/px · 1 of 37 slices shown]
[im 1/37]
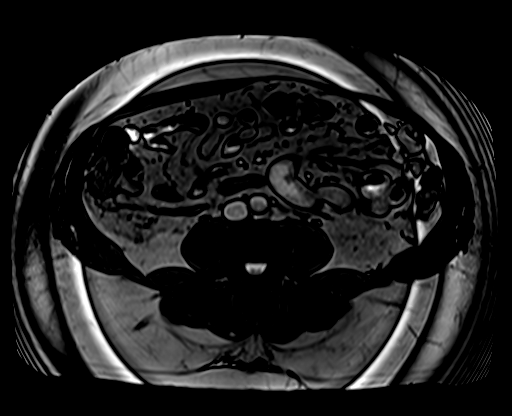

[Series 10: ax in and · axial · 3.0mm · 1.44mm/px · z∈[-86,+175]mm · 3 of 88 slices shown (1 of 2)]
[im 1/88]
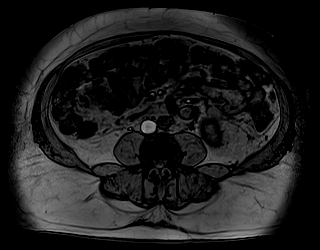
[im 44/88]
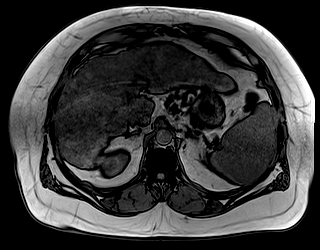
[im 88/88]
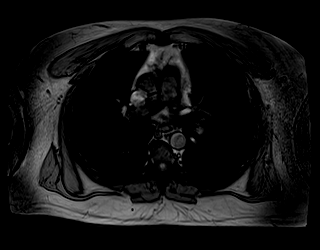

[Series 11: ax in and · axial · 3.0mm · 1.44mm/px · z∈[-86,+175]mm · 3 of 88 slices shown (2 of 2)]
[im 1/88]
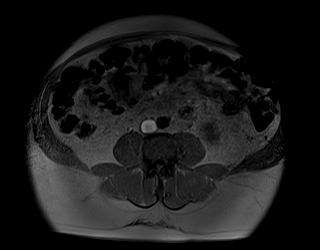
[im 44/88]
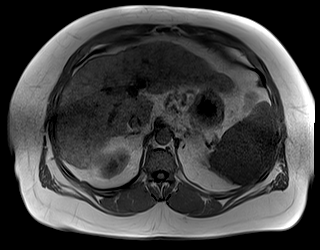
[im 88/88]
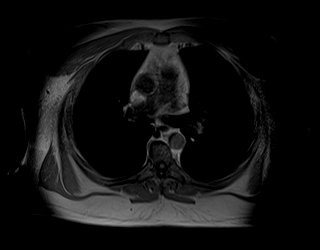

[Series 12: T1 dynamic · axial · 3.0mm · 1.44mm/px · z∈[-86,+175]mm · 3 of 88 slices shown]
[im 1/88]
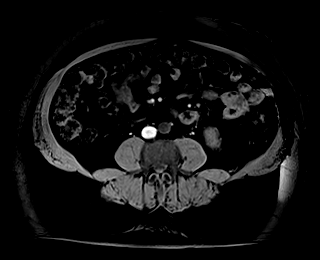
[im 44/88]
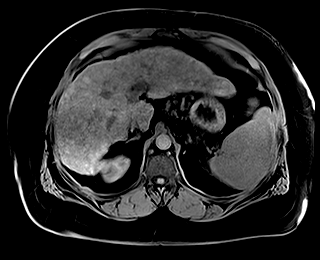
[im 88/88]
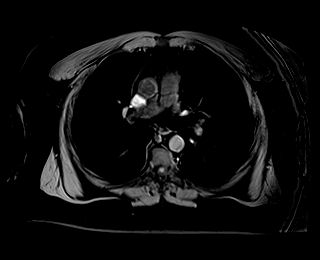

[Series 13: T1 dynamic post-contrast · axial · 3.0mm · 1.44mm/px · z∈[-86,+175]mm · 3 of 88 slices shown (1 of 7)]
[im 1/88]
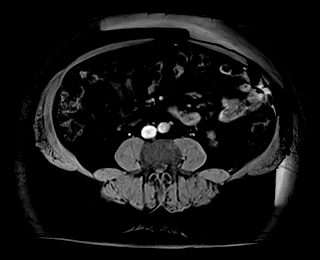
[im 44/88]
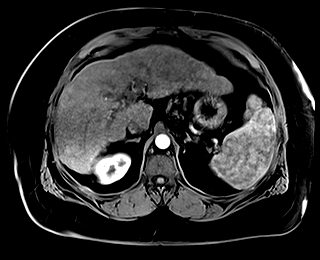
[im 88/88]
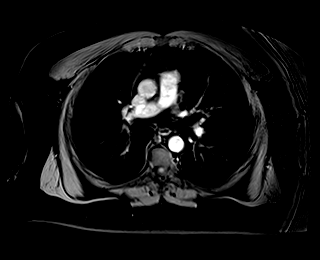

[Series 14: T1 dynamic post-contrast · axial · 3.0mm · 1.44mm/px · z∈[-86,+175]mm · 3 of 88 slices shown (2 of 7)]
[im 1/88]
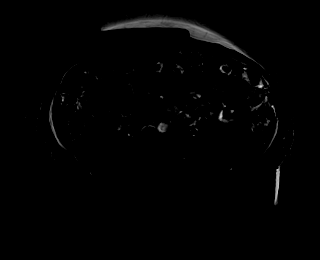
[im 44/88]
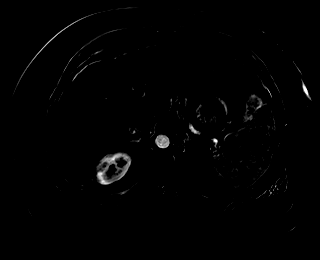
[im 88/88]
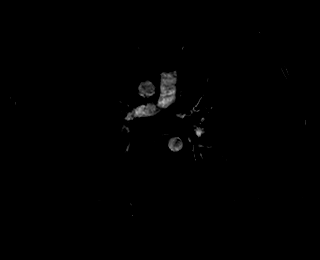

[Series 15: T1 dynamic post-contrast · axial · 3.0mm · 1.44mm/px · z∈[-86,+175]mm · 3 of 88 slices shown (3 of 7)]
[im 1/88]
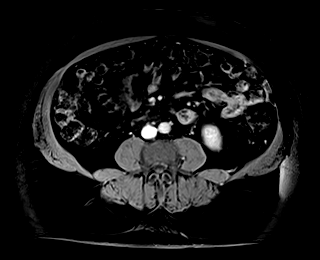
[im 44/88]
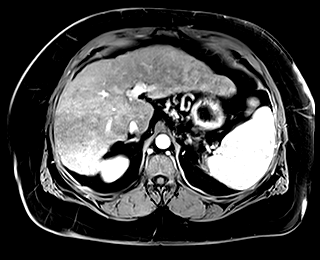
[im 88/88]
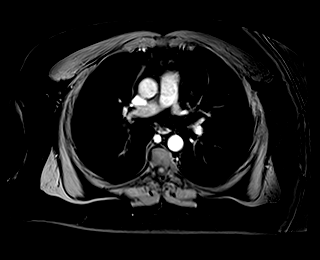

[Series 16: T1 dynamic post-contrast · axial · 3.0mm · 1.44mm/px · z∈[-86,+175]mm · 3 of 88 slices shown (4 of 7)]
[im 1/88]
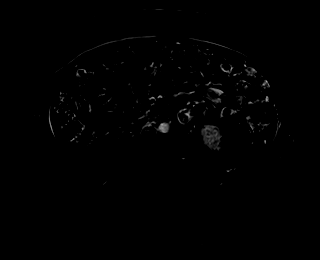
[im 44/88]
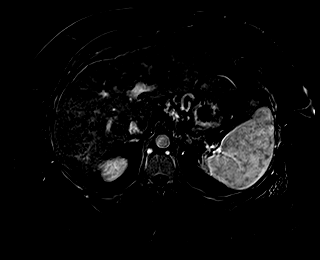
[im 88/88]
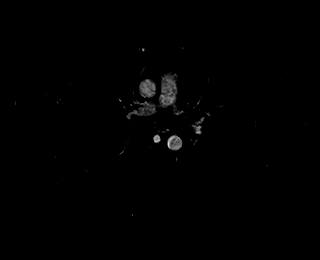

[Series 17: T1 dynamic post-contrast · axial · 3.0mm · 1.44mm/px · z∈[-86,+175]mm · 3 of 88 slices shown (5 of 7)]
[im 1/88]
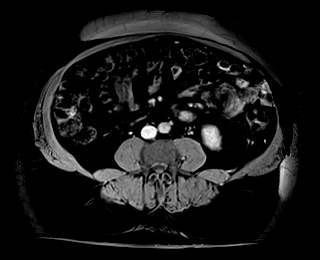
[im 44/88]
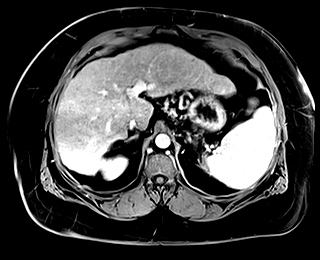
[im 88/88]
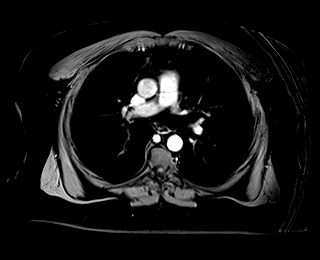

[Series 18: T1 dynamic post-contrast · axial · 3.0mm · 1.44mm/px · z∈[-86,+175]mm · 3 of 88 slices shown (6 of 7)]
[im 1/88]
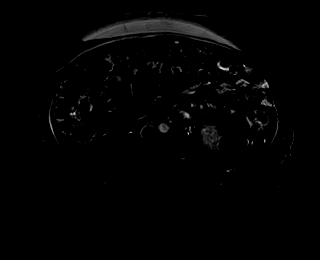
[im 44/88]
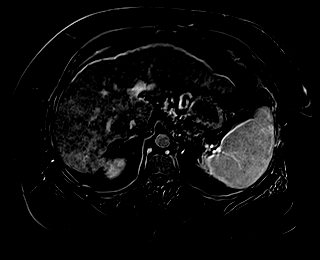
[im 88/88]
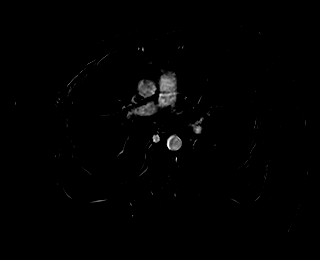

[Series 19: T1 dynamic post-contrast · coronal · 3.0mm · 1.41mm/px · 4 of 96 slices shown (7 of 7)]
[im 1/96]
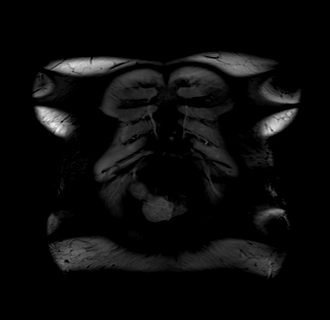
[im 32/96]
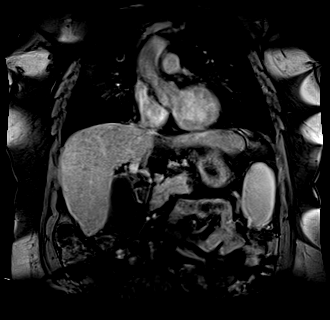
[im 64/96]
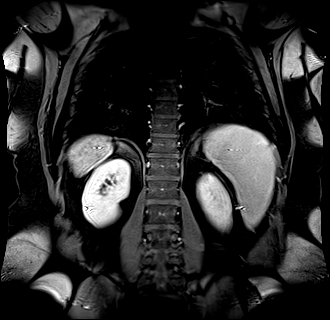
[im 96/96]
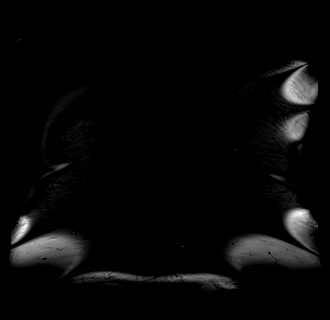

[48 of 48 positions shown; findings below may reference images not displayed]

FINDINGS: Lower chest: Unremarkable.

Hepatobiliary: The liver has a shrunken appearance and nodular
contour indicative of advanced cirrhosis. Heterogeneous loss of
signal intensity is noted throughout the hepatic parenchyma on out
of phase dual echo images, indicative of a background of hepatic
steatosis. In addition, throughout much of the central liver and
left lobe there is a lace-like pattern of T2 hyperintensity,
indicative of developing areas of hepatic fibrosis. On pre
gadolinium T1 weighted images, there are multiple small T1
hyperintense nodules, presumably small regenerative nodules. During
arterial phase post gadolinium imaging, no definite hypervascular
lesions are confidently identified on today's examination.
Specifically, no lesion in the right hepatic lobe to correspond to
the previously suspected FERIENHAUS 3 lesion. No intra or extrahepatic
biliary ductal dilatation. Tiny 3 mm filling defect lying
dependently in the gallbladder, presumably a small gallstone.
Gallbladder is moderately distended. Gallbladder wall thickness is
normal. No pericholecystic fluid or surrounding inflammatory
changes.

Pancreas: No pancreatic mass. No pancreatic ductal dilatation. No
pancreatic or peripancreatic fluid collections or inflammatory
changes.

Spleen: Spleen is enlarged measuring 14.2 x 6.0 x 17.3 cm (estimated
splenic volume of 737 mL).

Adrenals/Urinary Tract: In the upper pole of the left kidney (axial
image 21 of series 4) there is a well-defined 3.2 cm T1 hypointense,
T2 hyperintense, nonenhancing lesion, compatible with a simple cyst
(no follow-up imaging is required). Right kidney and bilateral
adrenal glands are normal in appearance. No hydroureteronephrosis in
the visualized portions of the abdomen.

Stomach/Bowel: Visualized portions are unremarkable.

Vascular/Lymphatic: No aneurysm identified in the visualized
abdominal vasculature. Portal vein is patent and measures 16 mm in
diameter in the porta hepatis. Several small portosystemic
collateral vessels are noted in the upper abdomen, including some
small proximal gastric and distal paraesophageal varices. No
lymphadenopathy noted in the abdomen.

Other: No significant volume of ascites in the visualized portions
of the peritoneal cavity.

Musculoskeletal: No aggressive appearing osseous lesions are noted
in the visualized portions of the skeleton.
IMPRESSION: 1. Advanced hepatic cirrhosis with developing hepatic fibrosis and a
background of hepatic steatosis. Previously noted FERIENHAUS 3 lesion
in the right lobe of the liver is no longer clearly evident. No
suspicious hepatic lesions are noted on today's examination.
2. Evidence of portal venous hypertension, including dilated portal
vein, splenomegaly and portosystemic collateral vessels, including
proximal gastric and distal paraesophageal varices.
3. Cholelithiasis without evidence of acute cholecystitis at this
time.
4. Additional incidental findings, as above.

## 2022-02-11 MED ORDER — GADOBUTROL 1 MMOL/ML IV SOLN
10.0000 mL | Freq: Once | INTRAVENOUS | Status: AC | PRN
  Administered 2022-02-11: 10 mL via INTRAVENOUS

## 2022-02-21 ENCOUNTER — Telehealth: Payer: Self-pay | Admitting: Internal Medicine

## 2022-02-21 NOTE — Telephone Encounter (Signed)
Pt is calling to get his lab results.

## 2022-02-21 NOTE — Telephone Encounter (Signed)
Pt calling to get his lab results. [phone]

## 2022-02-27 ENCOUNTER — Telehealth: Payer: Self-pay | Admitting: Internal Medicine

## 2022-02-27 NOTE — Telephone Encounter (Signed)
Dr. Abbey Chatters The pt is calling for both his results regarding his MRI and labs.

## 2022-02-27 NOTE — Telephone Encounter (Signed)
I called and spoke with patient.  Went over his MRI results.  Previously seen lesion resolved.  Erline Levine, can we make him follow-up appointment with me in 6 months with ultrasound prior for [facility] screening?  Thank you

## 2022-02-27 NOTE — Telephone Encounter (Signed)
Pt calling from MRI results, He said it's been 2 weeks. Please call [phone]

## 2022-02-28 NOTE — Telephone Encounter (Signed)
On recall  °

## 2022-02-28 NOTE — Telephone Encounter (Signed)
noted 

## 2022-03-19 ENCOUNTER — Encounter: Payer: Self-pay | Admitting: Emergency Medicine

## 2022-03-19 ENCOUNTER — Ambulatory Visit
Admission: EM | Admit: 2022-03-19 | Discharge: 2022-03-19 | Disposition: A | Discharge status: Home / Self Care | Payer: Medicare Other | Attending: Nurse Practitioner | Admitting: Nurse Practitioner

## 2022-03-19 DIAGNOSIS — M79672 Pain in left foot: Secondary | ICD-10-CM

## 2022-03-19 DIAGNOSIS — M7989 Other specified soft tissue disorders: Secondary | ICD-10-CM | POA: Diagnosis not present

## 2022-03-19 HISTORY — DX: Type 2 diabetes mellitus without complications: E11.9

## 2022-03-19 MED ORDER — DOXYCYCLINE HYCLATE 100 MG PO TABS: 100.0000 mg | ORAL_TABLET | Freq: Two times a day (BID) | ORAL | 0 refills | Status: DC

## 2022-03-19 NOTE — Discharge Instructions (Addendum)
Take medication as prescribed. RICE therapy, rest, ice, compression, and elevation.  Ice will help with swelling and pain.  Apply for 20 minutes, remove for 1 hour, then repeat. May take ibuprofen or Tylenol as needed for pain or discomfort. Follow-up in this clinic or in the ER if you develop fever, chills, chest pain, abdominal pain, with worsening swelling, redness, of the foot or if you develop drainage from the foot. Follow-up as needed.

## 2022-03-19 NOTE — ED Triage Notes (Signed)
Patient c/o LFT sided foot pain x 6 days.   Patient denies fall or trauma.   Patient endorses " I think something bit my foot".   Patient endorses soreness to area. Patient endorses increased pain with ambulation.   Patient endorses redness and increased pain on the dorsal side of foot.   Patient hasn't taken any medication for symptoms.

## 2022-03-19 NOTE — ED Provider Notes (Signed)
[facility]    CSN: [id number] Arrival date & time: 03/19/22  1110      History   Chief Complaint Chief Complaint  Patient presents with   Foot Pain    HPI [name] [name] is a 56 y.o. male.    Foot Pain    Patient presents for complaints of left foot pain.  Symptoms have been present for the past 6 days.  Patient complains of swelling to the top of his foot underneath his middle 3 toes.  He states the area has become tender to touch, and now he has pain with certain movements of the foot and with ambulation.  Also complains of worsening redness and soreness.  Patient states pain radiates into the left ankle.  He denies fever, chills, chest pain, abdominal pain, nausea, or vomiting.  Patient is unsure if something bit him such as a tick or an insect such as a spider.  He denies any known injury or trauma.  Patient has not taken any medication for his symptoms.  Patient does have a history of diabetes.  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   Dyspepsia    Thrombocytopenia ([place]) 10/19/2015    Past Surgical History:  Procedure Laterality Date   BIOPSY  03/27/2021   Procedure: BIOPSY;  Surgeon: Eloise Harman, DO;  [facility];  Service: Endoscopy;;  gastric   COLONOSCOPY N/A 05/14/2016   Procedure: COLONOSCOPY;  Surgeon: Danie Binder, MD;  [facility];  Service: Endoscopy;  Laterality: N/A;  1000 - moved to 1:30 - office to notify pt   COLONOSCOPY WITH PROPOFOL N/A 03/27/2021   Procedure: COLONOSCOPY WITH PROPOFOL;  Surgeon: Eloise Harman, DO;  [facility];  Service: Endoscopy;  Laterality: N/A;  1:30pm   ESOPHAGOGASTRODUODENOSCOPY  N/A 11/01/2015   Procedure: ESOPHAGOGASTRODUODENOSCOPY (EGD);  Surgeon: Danie Binder, MD;  [facility];  Service: Endoscopy;  Laterality: N/A;  245 - moved to 2/1 - office to notify    ESOPHAGOGASTRODUODENOSCOPY (EGD) WITH PROPOFOL N/A 03/27/2021   Procedure: ESOPHAGOGASTRODUODENOSCOPY (EGD) WITH PROPOFOL;  Surgeon: Eloise Harman, DO;  [facility];  Service: Endoscopy;  Laterality: N/A;   FOOT SURGERY Right    club foot repair   HAND SURGERY Right    tendon repair   KNEE SURGERY Right    POLYPECTOMY  03/27/2021   Procedure: POLYPECTOMY;  Surgeon: Eloise Harman, DO;  [facility];  Service: Endoscopy;;       Home Medications    Prior to Admission medications   Medication Sig Start Date End Date Taking? Authorizing Provider  atorvastatin (LIPITOR) 10 MG tablet Take 10 mg by mouth daily.   Yes [provider]  doxycycline (VIBRA-TABS) 100 MG tablet Take 1 tablet (100 mg total) by mouth 2 (two) times daily. 03/19/22  Yes Shulem Mader-Warren, Alda Lea, NP  JARDIANCE 25 MG TABS tablet Take 25 mg by mouth daily. 05/03/21  Yes [provider]  Semaglutide (OZEMPIC, 1 MG/DOSE, [place]) Inject 1 mg into the skin every Sunday.   Yes [provider]  empagliflozin (JARDIANCE)  10 MG TABS tablet Take 10 mg by mouth daily.    [provider]    Family History Family History  Problem Relation Age of Onset   Colon cancer Neg Hx    Colon polyps Neg Hx    Stomach cancer Neg Hx    Pancreatic cancer Neg Hx     Social History Social History   Tobacco Use   Smoking status: Former    Packs/day: 0.25    Years: 10.00    Total pack years: 2.50    Types: Cigarettes    Quit date: 02/27/1999    Years since quitting: 23.0   Smokeless tobacco: Never  Vaping Use   Vaping Use: Never used  Substance Use Topics   Alcohol use: No   Drug use: No     Allergies   Patient has no known allergies.   Review of Systems Review of  Systems Per HPI  Physical Exam Triage Vital Signs ED Triage Vitals  Enc Vitals Group     BP 03/19/22 1125 131/76     Pulse Rate 03/19/22 1125 80     Resp 03/19/22 1125 16     Temp 03/19/22 1125 98.2 F (36.8 C)     Temp Source 03/19/22 1125 Oral     SpO2 03/19/22 1125 94 %     Weight --      Height --      Head Circumference --      Peak Flow --      Pain Score 03/19/22 1130 4     Pain Loc --      Pain Edu? --      Excl. in [place]? --    No data found.  Updated Vital Signs BP 131/76 (BP Location: Right Arm)   Pulse 80   Temp 98.2 F (36.8 C) (Oral)   Resp 16   SpO2 94%   Visual Acuity Right Eye Distance:   Left Eye Distance:   Bilateral Distance:    Right Eye Near:   Left Eye Near:    Bilateral Near:     Physical Exam Vitals and nursing note reviewed.  HENT:     Head: Normocephalic.  Eyes:     Extraocular Movements: Extraocular movements intact.     Pupils: Pupils are equal, round, and reactive to light.  Cardiovascular:     Rate and Rhythm: Normal rate and regular rhythm.     Pulses: Normal pulses.     Heart sounds: Normal heart sounds.  Pulmonary:     Effort: Pulmonary effort is normal.     Breath sounds: Normal breath sounds.  Abdominal:     General: Bowel sounds are normal.     Palpations: Abdomen is soft.  Musculoskeletal:     Cervical back: Normal range of motion.     Left foot: Decreased range of motion. Normal capillary refill. Swelling and tenderness present. Normal pulse.     Comments: Decreased range of motion to the left foot.  Swelling is noted to the second through the fourth metatarsal.  Area is red, tender to palpation.  Pain is present with flexion and extension.  Skin:    General: Skin is warm and dry.     Comments: Induration noted to the dorsal aspect of the left foot.  Induration is flat, and surrounded by swelling with an erythematous base.  No drainage or fluctuance is present.  Neurological:     General: No focal deficit present.  Mental Status: He is oriented to person, place, and time.  Psychiatric:        Mood and Affect: Mood normal.        Behavior: Behavior normal.      UC Treatments / Results  Labs (all labs ordered are listed, but only abnormal results are displayed) Labs Reviewed - No data to display  EKG   Radiology No results found.  Procedures Procedures (including critical care time)  Medications Ordered in UC Medications - No data to display  Initial Impression / Assessment and Plan / UC Course  I have reviewed the triage vital signs and the nursing notes.  Pertinent labs & imaging results that were available during my care of the patient were reviewed by me and considered in my medical decision making (see chart for details).  Patient presents with left foot pain and swelling has been present for the past 6 days.  On exam, patient has an induration to the dorsal aspect of the left foot that is surrounded by erythema and swelling.  Patient has pain with flexion and extension and with ambulating.  We will cover patient with doxycycline for any soft tissue infection has history of diabetes.  Supportive care recommendations were also provided to the patient.  Strict return precautions were provided, follow-up as needed. Final Clinical Impressions(s) / UC Diagnoses   Final diagnoses:  Left foot pain  Swelling of left foot     Discharge Instructions      Take medication as prescribed. RICE therapy, rest, ice, compression, and elevation.  Ice will help with swelling and pain.  Apply for 20 minutes, remove for 1 hour, then repeat. May take ibuprofen or Tylenol as needed for pain or discomfort. Follow-up in this clinic or in the ER if you develop fever, chills, chest pain, abdominal pain, with worsening swelling, redness, of the foot or if you develop drainage from the foot. Follow-up as needed.     ED Prescriptions     Medication Sig Dispense Auth. Provider   doxycycline  (VIBRA-TABS) 100 MG tablet Take 1 tablet (100 mg total) by mouth 2 (two) times daily. 20 tablet Amarise Lillo-Warren, Alda Lea, NP      PDMP not reviewed this encounter.   Tish Men, NP 03/19/22 1200

## 2022-08-01 ENCOUNTER — Encounter: Payer: Self-pay | Admitting: *Deleted

## 2022-11-20 ENCOUNTER — Encounter (INDEPENDENT_AMBULATORY_CARE_PROVIDER_SITE_OTHER): Payer: Self-pay | Admitting: *Deleted

## 2022-11-20 ENCOUNTER — Encounter: Payer: Self-pay | Admitting: Internal Medicine

## 2022-11-20 ENCOUNTER — Ambulatory Visit (INDEPENDENT_AMBULATORY_CARE_PROVIDER_SITE_OTHER): Payer: 59 | Admitting: Internal Medicine

## 2022-11-20 VITALS — BP 111/72 | HR 79 | Temp 97.9°F | Ht 71.0 in | Wt 279.2 lb

## 2022-11-20 DIAGNOSIS — R7989 Other specified abnormal findings of blood chemistry: Secondary | ICD-10-CM | POA: Diagnosis not present

## 2022-11-20 DIAGNOSIS — K7460 Unspecified cirrhosis of liver: Secondary | ICD-10-CM | POA: Diagnosis not present

## 2022-11-20 DIAGNOSIS — D126 Benign neoplasm of colon, unspecified: Secondary | ICD-10-CM | POA: Diagnosis not present

## 2022-11-20 DIAGNOSIS — Z6838 Body mass index (BMI) 38.0-38.9, adult: Secondary | ICD-10-CM

## 2022-11-20 NOTE — Progress Notes (Signed)
Referring Provider: Jani Gravel, MD Primary Care Physician:  [name] Gravel, MD Primary GI:  Dr. Abbey [name]  Chief Complaint  Patient presents with   Cirrhosis    Follow up on cirrhosis. Reports he has pain in right rib area. Pain has been off and on for a couple of years. Would like to get ultrasound scheduled today.     HPI:   [name] [name] is a 57 y.o. male who presents to the clinic today for follow-up visit.  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   Previous serological workup negative 06/2021.   Risk factors for NASH include obesity, diabetes, dyslipidemia.  Has chronically elevated aminotransferases and alkaline phosphatase.  Patient denies any confusion or severe fatigue.  No melena hematochezia.  No abdominal or leg swelling.  Patient does not drink alcohol.  EGD 03/27/2021 without esophageal varices.  Gastric mucosa appeared inflamed but biopsies negative.  Colonoscopy 03/27/2021 with 1 tubular adenoma removed.  Recommended recall 02/2026.  Had an ultrasound 03/06/2021 for [facility] screening which showed potential liver lesion.  Subsequent MRI 03/22/2021 multiple LIRADS 3 lesions of the liver with recommended 25-monthfollow-up MRI. AFP WNL. Follow up MRI 02/11/22 showed resolution of previously noted lesions. Did show portal HTN findings with splenomegaly, gastric and distal esophageal varices.   Today, patient states he is doing well.  States his PCP retired, no recent blood work.   His wife [name] Moodwho normally accompanies him working as a sEnvironmental consultantat [employer]  Past Medical History:  Diagnosis Date   Arthritis    Cirrhosis ([place]    Depression    Diabetes ([place]    Umbilical hernia     Past Surgical History:  Procedure Laterality Date   BIOPSY  03/27/2021   Procedure: BIOPSY;  Surgeon: CEloise Harman DO;  [facility];  Service: Endoscopy;;  gastric   COLONOSCOPY N/A 05/14/2016   Procedure: COLONOSCOPY;   Surgeon: SDanie Binder MD;  [facility];  Service: Endoscopy;  Laterality: N/A;  1000 - moved to 1:30 - office to notify pt   COLONOSCOPY WITH PROPOFOL N/A 03/27/2021   Procedure: COLONOSCOPY WITH PROPOFOL;  Surgeon: CEloise Harman DO;  [facility];  Service: Endoscopy;  Laterality: N/A;  1:30pm   ESOPHAGOGASTRODUODENOSCOPY N/A 11/01/2015   Procedure: ESOPHAGOGASTRODUODENOSCOPY (EGD);  Surgeon: SDanie Binder MD;  [facility];  Service: Endoscopy;  Laterality: N/A;  245 - moved to 2/1 - office to notify    ESOPHAGOGASTRODUODENOSCOPY (EGD) WITH PROPOFOL N/A 03/27/2021   Procedure: ESOPHAGOGASTRODUODENOSCOPY (EGD) WITH PROPOFOL;  Surgeon: CEloise Harman DO;  [facility];  Service: Endoscopy;  Laterality: N/A;   FOOT SURGERY Right    club foot repair   HAND SURGERY Right    tendon repair   KNEE SURGERY Right    POLYPECTOMY  03/27/2021   Procedure: POLYPECTOMY;  Surgeon: CEloise Harman DO;  [facility];  Service: Endoscopy;;    Current Outpatient Medications  Medication Sig Dispense Refill   atorvastatin (LIPITOR) 10 MG tablet Take 10 mg by mouth daily.     JARDIANCE 25 MG TABS tablet Take 25 mg by mouth daily.     loratadine (CLARITIN) 10 MG tablet Take 10 mg by mouth daily.     Semaglutide, 1 MG/DOSE, 2 MG/1.5ML SOPN Inject 2 mg into the skin every Sunday.     No current facility-administered medications for this  visit.    Allergies as of 11/20/2022   (No Known Allergies)    Family History  Problem Relation Age of Onset   Colon cancer Neg Hx    Colon polyps Neg Hx    Stomach cancer Neg Hx    Pancreatic cancer Neg Hx     Social History   Socioeconomic History   Marital status: Married    Spouse name: Not on file   Number of children: Not on file   Years of education: Not on file   Highest education level: Not on file  Occupational History   Not on file  Tobacco Use   Smoking status: Former     Packs/day: 0.25    Years: 10.00    Total pack years: 2.50    Types: Cigarettes    Quit date: 02/27/1999    Years since quitting: 23.7    Passive exposure: Past   Smokeless tobacco: Never  Vaping Use   Vaping Use: Never used  Substance and Sexual Activity   Alcohol use: No   Drug use: No   Sexual activity: Never  Other Topics Concern   Not on file  Social History Narrative   Not on file   Social Determinants of Health   Financial Resource Strain: Not on file  Food Insecurity: Not on file  Transportation Needs: Not on file  Physical Activity: Not on file  Stress: Not on file  Social Connections: Not on file    Subjective: Review of Systems  Constitutional:  Negative for chills and fever.  HENT:  Negative for congestion and hearing loss.   Eyes:  Negative for blurred vision and double vision.  Respiratory:  Negative for cough and shortness of breath.   Cardiovascular:  Negative for chest pain and palpitations.  Gastrointestinal:  Negative for abdominal pain, blood in stool, constipation, diarrhea, heartburn, melena and vomiting.  Genitourinary:  Negative for dysuria and urgency.  Musculoskeletal:  Negative for joint pain and myalgias.  Skin:  Negative for itching and rash.  Neurological:  Negative for dizziness and headaches.  Psychiatric/Behavioral:  Negative for depression. The patient is not nervous/anxious.      Objective: BP 111/72 (BP Location: Left Arm, Patient Position: Sitting, Cuff Size: Large)   Pulse 79   Temp 97.9 F (36.6 C) (Oral)   Ht 5' 11"$  (1.803 m)   Wt 279 lb 3.2 oz (126.6 kg)   BMI 38.94 kg/m  Physical Exam Constitutional:      Appearance: Normal appearance. He is obese.  HENT:     Head: Normocephalic and atraumatic.  Eyes:     Extraocular Movements: Extraocular movements intact.     Conjunctiva/sclera: Conjunctivae normal.  Cardiovascular:     Rate and Rhythm: Normal rate and regular rhythm.  Pulmonary:     Effort: Pulmonary effort is  normal.     Breath sounds: Normal breath sounds.  Abdominal:     General: Bowel sounds are normal.     Palpations: Abdomen is soft.  Musculoskeletal:        General: Normal range of motion.     Cervical back: Normal range of motion and neck supple.  Skin:    General: Skin is warm.  Neurological:     General: No focal deficit present.     Mental Status: He is alert and oriented to person, place, and time.  Psychiatric:        [name] and Affect: [name] normal.        Behavior: Behavior  normal.      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    RUQ [place] ordered today for [place] screening. Check AFP.   No evidence of hypervolemia on exam today.  Counseled on low-sodium diet.  No history of encephalopathy.  Repeat EGD June 2024 for variceal screening unless suffers a decompensating event. Schedule on follow up visit.   Discussed NASH in depth with him today.  Counseled on the vast importance of keeping his diabetes and dyslipidemia under good control.  Also recommended weight loss of at least 10% which would be 29 pounds.  Recommended he institute 30 minutes of exercise 5 days a week.  States his biggest issue is with snacking throughout the day on his kids foods including butter fingers.    Follow-up in 6 months.   11/20/2022 9:36 AM   Disclaimer: This note was dictated with voice recognition software. Similar sounding words can inadvertently be transcribed and may not be corrected upon review.

## 2022-11-20 NOTE — Patient Instructions (Signed)
I am going to order ultrasound today to evaluate your liver for your pain as well as for liver cancer screening.  I am also going to order blood work to be done at [facility].  We will call with these results.  Recommend 1-2# weight loss per week until ideal body weight through exercise & diet. Low fat/cholesterol diet.   Avoid sweets, sodas, fruit juices, sweetened beverages like tea, etc. Gradually increase exercise from 15 min daily up to 1 hr per day 5 days/week. Avoid alcohol use.  Follow-up in 6 months or sooner if needed.  It was nice seeing you again today, please give Malachy Mood my best.  Dr. Abbey Chatters

## 2022-11-20 NOTE — Addendum Note (Signed)
Addended by: Cheron Every on: 11/20/2022 01:13 PM   Modules accepted: Orders

## 2022-11-25 ENCOUNTER — Encounter: Payer: Self-pay | Admitting: *Deleted

## 2022-11-25 ENCOUNTER — Telehealth: Payer: Self-pay | Admitting: *Deleted

## 2022-11-25 NOTE — Telephone Encounter (Signed)
Pt left vm needing to reschedule the [place] scheduled for 11/28/22 with arrival time of 7:1 am. Message sent to pt via MyChart with number to call to reschedule the [place] for a more convenient  time for him

## 2022-11-26 LAB — AFP TUMOR MARKER: AFP, Serum, Tumor Marker: 4.9 ng/mL (ref 0.0–8.4)

## 2022-11-26 LAB — PROTIME-INR
INR: 1.1 (ref 0.9–1.2)
Prothrombin Time: 11.7 s (ref 9.1–12.0)

## 2022-11-28 ENCOUNTER — Ambulatory Visit (HOSPITAL_COMMUNITY): Payer: 59

## 2022-12-03 ENCOUNTER — Ambulatory Visit (HOSPITAL_COMMUNITY)
Admission: RE | Admit: 2022-12-03 | Discharge: 2022-12-03 | Disposition: A | Discharge status: Home / Self Care | Payer: 59 | Source: Ambulatory Visit | Attending: Internal Medicine | Admitting: Internal Medicine

## 2022-12-03 DIAGNOSIS — K7460 Unspecified cirrhosis of liver: Secondary | ICD-10-CM

## 2022-12-09 ENCOUNTER — Other Ambulatory Visit: Payer: Self-pay | Admitting: *Deleted

## 2022-12-09 DIAGNOSIS — R9389 Abnormal findings on diagnostic imaging of other specified body structures: Secondary | ICD-10-CM

## 2022-12-13 ENCOUNTER — Telehealth: Payer: Self-pay

## 2022-12-13 NOTE — Telephone Encounter (Signed)
Pt's blood work is on [facility], drilling

## 2022-12-16 ENCOUNTER — Ambulatory Visit (HOSPITAL_COMMUNITY)
Admission: RE | Admit: 2022-12-16 | Discharge: 2022-12-16 | Disposition: A | Discharge status: Home / Self Care | Payer: 59 | Source: Ambulatory Visit | Attending: Internal Medicine | Admitting: Internal Medicine

## 2022-12-16 DIAGNOSIS — R9389 Abnormal findings on diagnostic imaging of other specified body structures: Secondary | ICD-10-CM | POA: Insufficient documentation

## 2022-12-16 LAB — POCT I-STAT CREATININE: Creatinine, Ser: 0.8 mg/dL (ref 0.61–1.24)

## 2022-12-16 MED ORDER — IOHEXOL 300 MG/ML  SOLN
100.0000 mL | Freq: Once | INTRAMUSCULAR | Status: AC | PRN
  Administered 2022-12-16: 100 mL via INTRAVENOUS

## 2023-02-02 NOTE — Progress Notes (Signed)
[facility] [address] [place] [address]   Clinic Day:  02/03/2023  Referring physician: Katherine Basset*  Patient Care Team: Pearson Grippe, MD as PCP - General (Internal Medicine) West Bali, MD (Inactive) as Consulting Physician (Gastroenterology)   ASSESSMENT & PLAN:   Assessment:  1.  Severe thrombocytopenia: - Patient seen at the request of Colleen Can, NP - PLT (11/24/2022): 48, 76 (02/2018), 108 (10/2016), 130 (10/2015) - CT abdomen (12/16/2022): Cirrhosis with sequela of portal hypertension, splenomegaly 17.8 cm.  2.  Social/family history: - He is seen with his wife Elnita Maxwell.  He works as a Psychologist, occupational.  Non-smoker.  Nonalcoholic. - Mother had nonalcoholic fatty liver disease.  Father had cirrhosis.  Maternal aunt had breast cancer.  Maternal grandmother had breast cancer.  Brother had throat cancer.  Plan:  1.  Severe thrombocytopenia: - We discussed differential diagnosis of severe thrombocytopenia, with main etiology being splenomegaly. - Previous hepatitis B and C serology and ANA was negative.  Will check for nutritional deficiencies and bone marrow infiltrative process. - Discussed the role of partial embolization of spleen to improve platelet count if continues to worsen. - RTC 3 to 4 weeks for follow-up.   Orders Placed This Encounter  Procedures   CBC with Differential    Standing Status:   Future    Number of Occurrences:   1    Standing Expiration Date:   02/03/2024   Vitamin B12    Standing Status:   Future    Number of Occurrences:   1    Standing Expiration Date:   02/03/2024   Folate    Standing Status:   Future    Number of Occurrences:   1    Standing Expiration Date:   02/03/2024   Immature Platelet Fraction    Standing Status:   Future    Number of Occurrences:   1    Standing Expiration Date:   02/03/2024   Hepatitis B surface antibody    Standing Status:   Future    Number of Occurrences:   1    Standing Expiration  Date:   02/03/2024   Hepatitis C Antibody    Standing Status:   Future    Number of Occurrences:   1    Standing Expiration Date:   02/03/2024   Hepatitis B core antibody, total    Standing Status:   Future    Number of Occurrences:   1    Standing Expiration Date:   02/03/2024   Hepatitis B surface antigen    Standing Status:   Future    Number of Occurrences:   1    Standing Expiration Date:   02/03/2024   Copper, serum    Standing Status:   Future    Number of Occurrences:   1    Standing Expiration Date:   02/03/2024   Methylmalonic acid, serum    Standing Status:   Future    Number of Occurrences:   1    Standing Expiration Date:   02/03/2024   Rheumatoid factor    Standing Status:   Future    Number of Occurrences:   1    Standing Expiration Date:   02/03/2024   Kappa/lambda light chains    Standing Status:   Future    Number of Occurrences:   1    Standing Expiration Date:   02/03/2024   Protein electrophoresis, serum    Standing Status:  Future    Number of Occurrences:   1    Standing Expiration Date:   02/03/2024   Lactate dehydrogenase    Standing Status:   Future    Number of Occurrences:   1    Standing Expiration Date:   02/03/2024   Immunofixation electrophoresis    Standing Status:   Future    Number of Occurrences:   1    Standing Expiration Date:   02/03/2024   Reticulocytes    Standing Status:   Future    Number of Occurrences:   1    Standing Expiration Date:   02/03/2024      I,Katie Daubenspeck,acting as a scribe for [name], MD.,have documented all relevant documentation on the behalf of [name], MD,as directed by  [name], MD while in the presence of [name], MD.   I, [name] MD, have reviewed the above documentation for accuracy and completeness, and I agree with the above.   [name], MD   5/6/20246:56 PM  CHIEF COMPLAINT/PURPOSE OF CONSULT:   Diagnosis: low platelet count  Current  Therapy: None  HISTORY OF PRESENT ILLNESS:   [name] [name] is a 57 y.o. male presenting to clinic today for evaluation of low platelet count at the request of Colleen Can, NP.  Today, he states that he is doing well overall. His appetite level is at 100%. His energy level is at 50%.  He has a history of low platelet count dating back to at least 10/2015 (first recorded level in Epic), at which time it was 130k. This dropped further to 97k in 03/2016 and stabilized to 108k in 10/2016. However, more recently, it has decreased further to 51k in 02/2021. His most recent level obtained with his PCP on 11/24/22 was 48k.  PAST MEDICAL HISTORY:   Past Medical History: Past Medical History:  Diagnosis Date   Arthritis    Cirrhosis (HCC)    Depression    Diabetes (HCC)    Umbilical hernia     Surgical History: Past Surgical History:  Procedure Laterality Date   BIOPSY  03/27/2021   Procedure: BIOPSY;  Surgeon: Lanelle Bal, DO;  [facility];  Service: Endoscopy;;  gastric   COLONOSCOPY N/A 05/14/2016   Procedure: COLONOSCOPY;  Surgeon: West Bali, MD;  [facility];  Service: Endoscopy;  Laterality: N/A;  1000 - moved to 1:30 - office to notify pt   COLONOSCOPY WITH PROPOFOL N/A 03/27/2021   Procedure: COLONOSCOPY WITH PROPOFOL;  Surgeon: Lanelle Bal, DO;  [facility];  Service: Endoscopy;  Laterality: N/A;  1:30pm   ESOPHAGOGASTRODUODENOSCOPY N/A 11/01/2015   Procedure: ESOPHAGOGASTRODUODENOSCOPY (EGD);  Surgeon: West Bali, MD;  [facility];  Service: Endoscopy;  Laterality: N/A;  245 - moved to 2/1 - office to notify    ESOPHAGOGASTRODUODENOSCOPY (EGD) WITH PROPOFOL N/A 03/27/2021   Procedure: ESOPHAGOGASTRODUODENOSCOPY (EGD) WITH PROPOFOL;  Surgeon: Lanelle Bal, DO;  [facility];  Service: Endoscopy;  Laterality: N/A;   FOOT SURGERY Right    club foot repair   HAND SURGERY Right    tendon repair   KNEE SURGERY  Right    POLYPECTOMY  03/27/2021   Procedure: POLYPECTOMY;  Surgeon: Lanelle Bal, DO;  [facility];  Service: Endoscopy;;    Social History: Social History   Socioeconomic History   Marital status: Married    Spouse name: Not on file   Number of  children: Not on file   Years of education: Not on file   Highest education level: Not on file  Occupational History   Not on file  Tobacco Use   Smoking status: Former    Packs/day: 0.25    Years: 10.00    Additional pack years: 0.00    Total pack years: 2.50    Types: Cigarettes    Quit date: 02/27/1999    Years since quitting: 23.9    Passive exposure: Past   Smokeless tobacco: Never  Vaping Use   Vaping Use: Never used  Substance and Sexual Activity   Alcohol use: No   Drug use: No   Sexual activity: Never  Other Topics Concern   Not on file  Social History Narrative   Not on file   Social Determinants of Health   Financial Resource Strain: Not on file  Food Insecurity: No Food Insecurity (02/03/2023)   Hunger Vital Sign    Worried About Running Out of Food in the Last Year: Never true    Ran Out of Food in the Last Year: Never true  Transportation Needs: No Transportation Needs (02/03/2023)   PRAPARE - Administrator, Civil Service (Medical): No    Lack of Transportation (Non-Medical): No  Physical Activity: Not on file  Stress: Not on file  Social Connections: Not on file  Intimate Partner Violence: Not At Risk (02/03/2023)   Humiliation, Afraid, Rape, and Kick questionnaire    Fear of Current or Ex-Partner: No    Emotionally Abused: No    Physically Abused: No    Sexually Abused: No    Family History: Family History  Problem Relation Age of Onset   Colon cancer Neg Hx    Colon polyps Neg Hx    Stomach cancer Neg Hx    Pancreatic cancer Neg Hx     Current Medications:  Current Outpatient Medications:    atorvastatin (LIPITOR) 10 MG tablet, Take 10 mg by mouth daily., Disp: ,  Rfl:    JARDIANCE 25 MG TABS tablet, Take 25 mg by mouth daily., Disp: , Rfl:    loratadine (CLARITIN) 10 MG tablet, Take 10 mg by mouth daily., Disp: , Rfl:    Semaglutide, 1 MG/DOSE, 2 MG/1.5ML SOPN, Inject 2 mg into the skin every Sunday., Disp: , Rfl:    Allergies: No Known Allergies  REVIEW OF SYSTEMS:   Review of Systems  Constitutional:  Negative for chills, fatigue and fever.  HENT:   Negative for lump/mass, mouth sores, nosebleeds, sore throat and trouble swallowing.   Eyes:  Negative for eye problems.  Respiratory:  Positive for shortness of breath. Negative for cough.   Cardiovascular:  Negative for chest pain, leg swelling and palpitations.  Gastrointestinal:  Negative for abdominal pain, constipation, diarrhea, nausea and vomiting.  Genitourinary:  Negative for bladder incontinence, difficulty urinating, dysuria, frequency, hematuria and nocturia.   Musculoskeletal:  Positive for arthralgias. Negative for back pain, flank pain, myalgias and neck pain.  Skin:  Negative for itching and rash.  Neurological:  Negative for dizziness, headaches and numbness.  Hematological:  Does not bruise/bleed easily.  Psychiatric/Behavioral:  Negative for depression, sleep disturbance and suicidal ideas. The patient is not nervous/anxious.   All other systems reviewed and are negative.    VITALS:   Blood pressure 139/75, pulse 86, temperature 98.5 F (36.9 C), temperature source Oral, resp. rate 19, height 5\' 11"  (1.803 m), weight 283 lb 9.6 oz (128.6 kg), SpO2 96 %.  Wt Readings from Last 3 Encounters:  02/03/23 283 lb 9.6 oz (128.6 kg)  11/20/22 279 lb 3.2 oz (126.6 kg)  07/04/21 283 lb 12.8 oz (128.7 kg)    Body mass index is 39.55 kg/m.   PHYSICAL EXAM:   Physical Exam Vitals and nursing note reviewed. Exam conducted with a chaperone present.  Constitutional:      Appearance: Normal appearance.  Cardiovascular:     Rate and Rhythm: Normal rate and regular rhythm.      Pulses: Normal pulses.     Heart sounds: Normal heart sounds.  Pulmonary:     Effort: Pulmonary effort is normal.     Breath sounds: Normal breath sounds.  Abdominal:     Palpations: Abdomen is soft. There is no hepatomegaly, splenomegaly or mass.     Tenderness: There is no abdominal tenderness.  Musculoskeletal:     Right lower leg: No edema.     Left lower leg: No edema.  Lymphadenopathy:     Cervical: No cervical adenopathy.     Right cervical: No superficial, deep or posterior cervical adenopathy.    Left cervical: No superficial, deep or posterior cervical adenopathy.     Upper Body:     Right upper body: No supraclavicular or axillary adenopathy.     Left upper body: No supraclavicular or axillary adenopathy.  Neurological:     General: No focal deficit present.     Mental Status: He is alert and oriented to person, place, and time.  Psychiatric:        Mood and Affect: Mood normal.        Behavior: Behavior normal.     LABS:      Latest Ref Rng & Units 02/03/2023   12:13 PM 03/21/2021   11:40 AM 11/12/2016    9:33 AM  CBC  WBC 4.0 - 10.5 K/uL 3.7  2.8  4.2   Hemoglobin 13.0 - 17.0 g/dL 16.1  09.6  04.5   Hematocrit 39.0 - 52.0 % 42.6  43.4  44.8   Platelets 150 - 400 K/uL 62  51  108       Latest Ref Rng & Units 12/16/2022    3:57 PM 07/04/2021   11:28 AM 04/26/2021    9:43 AM  CMP  Glucose 70 - 99 mg/dL  409  811   BUN 6 - 24 mg/dL  11  10   Creatinine 9.14 - 1.24 mg/dL 7.82  9.56  2.13   Sodium 134 - 144 mmol/L  143  142   Potassium 3.5 - 5.2 mmol/L  3.6  3.9   Chloride 96 - 106 mmol/L  106  105   CO2 20 - 29 mmol/L  23  21   Calcium 8.7 - 10.2 mg/dL  9.1  8.7   Total Protein 6.0 - 8.5 g/dL  7.1  7.1   Total Bilirubin 0.0 - 1.2 mg/dL  0.8  0.8   Alkaline Phos 44 - 121 IU/L  164  199   AST 0 - 40 IU/L  70  95   ALT 0 - 44 IU/L  61  72      No results found for: "CEA1", "CEA" / No results found for: "CEA1", "CEA" No results found for: "PSA1" No results  found for: "YQM578" No results found for: "CAN125"  No results found for: "TOTALPROTELP", "ALBUMINELP", "A1GS", "A2GS", "BETS", "BETA2SER", "GAMS", "MSPIKE", "SPEI" Lab Results  Component Value Date   TIBC 326 07/04/2021   FERRITIN  180 07/04/2021   IRONPCTSAT 23 07/04/2021   Lab Results  Component Value Date   LDH 174 02/03/2023     STUDIES:   No results found.

## 2023-02-03 ENCOUNTER — Inpatient Hospital Stay: Payer: 59

## 2023-02-03 ENCOUNTER — Inpatient Hospital Stay: Payer: 59 | Attending: Hematology | Admitting: Hematology

## 2023-02-03 VITALS — BP 139/75 | HR 86 | Temp 98.5°F | Resp 19 | Ht 71.0 in | Wt 283.6 lb

## 2023-02-03 DIAGNOSIS — K766 Portal hypertension: Secondary | ICD-10-CM | POA: Insufficient documentation

## 2023-02-03 DIAGNOSIS — M2550 Pain in unspecified joint: Secondary | ICD-10-CM | POA: Insufficient documentation

## 2023-02-03 DIAGNOSIS — Z803 Family history of malignant neoplasm of breast: Secondary | ICD-10-CM | POA: Insufficient documentation

## 2023-02-03 DIAGNOSIS — R0602 Shortness of breath: Secondary | ICD-10-CM | POA: Insufficient documentation

## 2023-02-03 DIAGNOSIS — D696 Thrombocytopenia, unspecified: Secondary | ICD-10-CM

## 2023-02-03 DIAGNOSIS — Z8379 Family history of other diseases of the digestive system: Secondary | ICD-10-CM | POA: Diagnosis not present

## 2023-02-03 DIAGNOSIS — Z87891 Personal history of nicotine dependence: Secondary | ICD-10-CM | POA: Diagnosis not present

## 2023-02-03 DIAGNOSIS — Z79899 Other long term (current) drug therapy: Secondary | ICD-10-CM | POA: Diagnosis not present

## 2023-02-03 DIAGNOSIS — K7460 Unspecified cirrhosis of liver: Secondary | ICD-10-CM | POA: Diagnosis not present

## 2023-02-03 DIAGNOSIS — R161 Splenomegaly, not elsewhere classified: Secondary | ICD-10-CM | POA: Insufficient documentation

## 2023-02-03 LAB — CBC WITH DIFFERENTIAL/PLATELET
Abs Immature Granulocytes: 0 10*3/uL (ref 0.00–0.07)
Basophils Absolute: 0 10*3/uL (ref 0.0–0.1)
Basophils Relative: 1 %
Eosinophils Absolute: 0.1 10*3/uL (ref 0.0–0.5)
Eosinophils Relative: 2 %
HCT: 42.6 % (ref 39.0–52.0)
Hemoglobin: 15 g/dL (ref 13.0–17.0)
Immature Granulocytes: 0 %
Lymphocytes Relative: 24 %
Lymphs Abs: 0.9 10*3/uL (ref 0.7–4.0)
MCH: 31 pg (ref 26.0–34.0)
MCHC: 35.2 g/dL (ref 30.0–36.0)
MCV: 88 fL (ref 80.0–100.0)
Monocytes Absolute: 0.3 10*3/uL (ref 0.1–1.0)
Monocytes Relative: 9 %
Neutro Abs: 2.4 10*3/uL (ref 1.7–7.7)
Neutrophils Relative %: 64 %
Platelets: 62 10*3/uL — ABNORMAL LOW (ref 150–400)
RBC: 4.84 MIL/uL (ref 4.22–5.81)
RDW: 13 % (ref 11.5–15.5)
WBC: 3.7 10*3/uL — ABNORMAL LOW (ref 4.0–10.5)
nRBC: 0 % (ref 0.0–0.2)

## 2023-02-03 LAB — IMMATURE PLATELET FRACTION: Immature Platelet Fraction: 15.5 % — ABNORMAL HIGH (ref 1.2–8.6)

## 2023-02-03 LAB — RETICULOCYTES
Immature Retic Fract: 8 % (ref 2.3–15.9)
RBC.: 4.76 MIL/uL (ref 4.22–5.81)
Retic Count, Absolute: 76.6 10*3/uL (ref 19.0–186.0)
Retic Ct Pct: 1.6 % (ref 0.4–3.1)

## 2023-02-03 LAB — FOLATE: Folate: 15.9 ng/mL (ref >5.9)

## 2023-02-03 LAB — LACTATE DEHYDROGENASE: LDH: 174 U/L (ref 98–192)

## 2023-02-03 LAB — VITAMIN B12: Vitamin B-12: 379 pg/mL (ref 180–914)

## 2023-02-03 LAB — HEPATITIS C ANTIBODY: HCV Ab: NONREACTIVE

## 2023-02-03 LAB — HEPATITIS B SURFACE ANTIBODY,QUALITATIVE: Hep B S Ab: NONREACTIVE

## 2023-02-03 LAB — HEPATITIS B SURFACE ANTIGEN: Hepatitis B Surface Ag: NONREACTIVE

## 2023-02-03 LAB — HEPATITIS B CORE ANTIBODY, TOTAL: Hep B Core Total Ab: NONREACTIVE

## 2023-02-03 NOTE — Patient Instructions (Signed)
You were seen and examined today by Dr. Ellin Saba. Dr. Ellin Saba is a hematologist, meaning that he specializes in blood abnormalities. Dr. Ellin Saba discussed your past medical history, family history of cancers/blood conditions and the events that led to you being here today.  You were referred to Dr. Ellin Saba due to low white blood cell count and low platelet count.  Dr. Ellin Saba has recommended additional labs today for further evaluation.  Follow-up as scheduled.

## 2023-02-04 LAB — KAPPA/LAMBDA LIGHT CHAINS
Kappa free light chain: 33.4 mg/L — ABNORMAL HIGH (ref 3.3–19.4)
Kappa, lambda light chain ratio: 1.24 (ref 0.26–1.65)
Lambda free light chains: 26.9 mg/L — ABNORMAL HIGH (ref 5.7–26.3)

## 2023-02-05 LAB — RHEUMATOID FACTOR: Rheumatoid fact SerPl-aCnc: <10 IU/mL (ref <14.0)

## 2023-02-05 LAB — COPPER, SERUM: Copper: 83 ug/dL (ref 69–132)

## 2023-02-06 LAB — METHYLMALONIC ACID, SERUM: Methylmalonic Acid, Quantitative: 116 nmol/L (ref 0–378)

## 2023-02-06 LAB — PROTEIN ELECTROPHORESIS, SERUM
A/G Ratio: 1 (ref 0.7–1.7)
Albumin ELP: 3.4 g/dL (ref 2.9–4.4)
Alpha-1-Globulin: 0.2 g/dL (ref 0.0–0.4)
Alpha-2-Globulin: 0.6 g/dL (ref 0.4–1.0)
Beta Globulin: 1.3 g/dL (ref 0.7–1.3)
Gamma Globulin: 1.2 g/dL (ref 0.4–1.8)
Globulin, Total: 3.3 g/dL (ref 2.2–3.9)
Total Protein ELP: 6.7 g/dL (ref 6.0–8.5)

## 2023-02-07 LAB — IMMUNOFIXATION ELECTROPHORESIS
IgA: 568 mg/dL — ABNORMAL HIGH (ref 90–386)
IgG (Immunoglobin G), Serum: 1134 mg/dL (ref 603–1613)
IgM (Immunoglobulin M), Srm: 102 mg/dL (ref 20–172)
Total Protein ELP: 6.8 g/dL (ref 6.0–8.5)

## 2023-02-26 ENCOUNTER — Ambulatory Visit (HOSPITAL_COMMUNITY)
Admission: RE | Admit: 2023-02-26 | Discharge: 2023-02-26 | Disposition: A | Discharge status: Home / Self Care | Payer: 59 | Source: Ambulatory Visit | Attending: Adult Health | Admitting: Adult Health

## 2023-02-26 ENCOUNTER — Other Ambulatory Visit (HOSPITAL_COMMUNITY): Payer: Self-pay | Admitting: Adult Health

## 2023-02-26 DIAGNOSIS — M25562 Pain in left knee: Secondary | ICD-10-CM

## 2023-03-03 ENCOUNTER — Inpatient Hospital Stay: Payer: 59 | Admitting: Hematology

## 2023-03-04 NOTE — Progress Notes (Signed)
[facility] [address] [place] [address]    Clinic Day:  03/05/2023  Referring physician: Pearson Grippe, MD  Patient Care Team: Pearson Grippe, MD as PCP - General (Internal Medicine) West Bali, MD (Inactive) as Consulting Physician (Gastroenterology)   ASSESSMENT & PLAN:   Assessment: 1.  Severe thrombocytopenia: - Patient seen at the request of Colleen Can, NP - PLT (11/24/2022): 48, 76 (02/2018), 108 (10/2016), 130 (10/2015) - CT abdomen (12/16/2022): Cirrhosis with sequela of portal hypertension, splenomegaly 17.8 cm.   2.  Social/family history: - He is seen with his wife Elnita Maxwell.  He works as a Psychologist, occupational.  Non-smoker.  Nonalcoholic. - Mother had nonalcoholic fatty liver disease.  Father had cirrhosis.  Maternal aunt had breast cancer.  Maternal grandmother had breast cancer.  Brother had throat cancer.    Plan: 1.  Severe thrombocytopenia: - We have discussed labs from 02/03/2023: Normal LDH, folic acid, B12, copper and MMA.  SPEP was negative.  Free light chain ratio and immunofixation were normal.  Hepatitis B serology was negative. - Repeat CBC showed white count 3.7 with normal ANC.  Platelet count improved to 62 K.  Hemoglobin normal at 15.  Immature platelet fraction is elevated at 15.5 indicating peripheral destruction. - Thrombocytopenia from splenomegaly and splenic sequestration.  There might be a small component of platelet destruction. - I have recommended observation at this time.  RTC 6 months with repeat CBC and IPF.    Orders Placed This Encounter  Procedures   CBC with Differential/Platelet    Standing Status:   Future    Standing Expiration Date:   03/04/2024    Order Specific Question:   Release to patient    Answer:   Immediate   Immature Platelet Fraction    Standing Status:   Future    Standing Expiration Date:   03/04/2024      I,Katie Daubenspeck,acting as a scribe for [name], MD.,have documented all relevant  documentation on the behalf of [name], MD,as directed by  [name], MD while in the presence of [name], MD.   I, [name] MD, have reviewed the above documentation for accuracy and completeness, and I agree with the above.   [name], MD   6/5/20244:42 PM  CHIEF COMPLAINT:   Diagnosis: severe thrombocytopenia    Cancer Staging  No matching staging information was found for the patient.   Prior Therapy: none  Current Therapy: Observation   HISTORY OF PRESENT ILLNESS:   Oncology History   No history exists.     INTERVAL HISTORY:   Christofer is a 57 y.o. male presenting to clinic today for follow up of severe thrombocytopenia. He was last seen by me on 02/03/23 in consultation.  Today, he states that he is doing well overall. His appetite level is at 100%. His energy level is at 75%.  PAST MEDICAL HISTORY:   Past Medical History: Past Medical History:  Diagnosis Date   Arthritis    Cirrhosis (HCC)    Depression    Diabetes (HCC)    Umbilical hernia     Surgical History: Past Surgical History:  Procedure Laterality Date   BIOPSY  03/27/2021   Procedure: BIOPSY;  Surgeon: Lanelle Bal, DO;  [facility];  Service: Endoscopy;;  gastric   COLONOSCOPY N/A 05/14/2016   Procedure: COLONOSCOPY;  Surgeon: West Bali, MD;  [facility];  Service: Endoscopy;  Laterality: N/A;  1000 -  moved to 1:30 - office to notify pt   COLONOSCOPY WITH PROPOFOL N/A 03/27/2021   Procedure: COLONOSCOPY WITH PROPOFOL;  Surgeon: Lanelle Bal, DO;  [facility];  Service: Endoscopy;  Laterality: N/A;  1:30pm   ESOPHAGOGASTRODUODENOSCOPY N/A 11/01/2015   Procedure: ESOPHAGOGASTRODUODENOSCOPY (EGD);  Surgeon: West Bali, MD;  [facility];  Service: Endoscopy;  Laterality: N/A;  245 - moved to 2/1 - office to notify    ESOPHAGOGASTRODUODENOSCOPY (EGD) WITH PROPOFOL N/A 03/27/2021    Procedure: ESOPHAGOGASTRODUODENOSCOPY (EGD) WITH PROPOFOL;  Surgeon: Lanelle Bal, DO;  [facility];  Service: Endoscopy;  Laterality: N/A;   FOOT SURGERY Right    club foot repair   HAND SURGERY Right    tendon repair   KNEE SURGERY Right    POLYPECTOMY  03/27/2021   Procedure: POLYPECTOMY;  Surgeon: Lanelle Bal, DO;  [facility];  Service: Endoscopy;;    Social History: Social History   Socioeconomic History   Marital status: Married    Spouse name: Not on file   Number of children: Not on file   Years of education: Not on file   Highest education level: Not on file  Occupational History   Not on file  Tobacco Use   Smoking status: Former    Packs/day: 0.25    Years: 10.00    Additional pack years: 0.00    Total pack years: 2.50    Types: Cigarettes    Quit date: 02/27/1999    Years since quitting: 24.0    Passive exposure: Past   Smokeless tobacco: Never  Vaping Use   Vaping Use: Never used  Substance and Sexual Activity   Alcohol use: No   Drug use: No   Sexual activity: Never  Other Topics Concern   Not on file  Social History Narrative   Not on file   Social Determinants of Health   Financial Resource Strain: Not on file  Food Insecurity: No Food Insecurity (02/03/2023)   Hunger Vital Sign    Worried About Running Out of Food in the Last Year: Never true    Ran Out of Food in the Last Year: Never true  Transportation Needs: No Transportation Needs (02/03/2023)   PRAPARE - Administrator, Civil Service (Medical): No    Lack of Transportation (Non-Medical): No  Physical Activity: Not on file  Stress: Not on file  Social Connections: Not on file  Intimate Partner Violence: Not At Risk (02/03/2023)   Humiliation, Afraid, Rape, and Kick questionnaire    Fear of Current or Ex-Partner: No    Emotionally Abused: No    Physically Abused: No    Sexually Abused: No    Family History: Family History  Problem Relation  Age of Onset   Colon cancer Neg Hx    Colon polyps Neg Hx    Stomach cancer Neg Hx    Pancreatic cancer Neg Hx     Current Medications:  Current Outpatient Medications:    atorvastatin (LIPITOR) 10 MG tablet, Take 10 mg by mouth daily., Disp: , Rfl:    metFORMIN (GLUCOPHAGE) 500 MG tablet, Take 500 mg by mouth daily., Disp: , Rfl:    methylPREDNISolone (MEDROL DOSEPAK) 4 MG TBPK tablet, Take by mouth as directed., Disp: , Rfl:    OZEMPIC, 2 MG/DOSE, 8 MG/3ML SOPN, Inject 2 mg into the skin once a week., Disp: , Rfl:    Allergies: No Known Allergies  REVIEW  OF SYSTEMS:   Review of Systems  Constitutional:  Negative for chills, fatigue and fever.  HENT:   Negative for lump/mass, mouth sores, nosebleeds, sore throat and trouble swallowing.   Eyes:  Negative for eye problems.  Respiratory:  Negative for cough and shortness of breath.   Cardiovascular:  Negative for chest pain, leg swelling and palpitations.  Gastrointestinal:  Negative for abdominal pain, constipation, diarrhea, nausea and vomiting.  Genitourinary:  Negative for bladder incontinence, difficulty urinating, dysuria, frequency, hematuria and nocturia.   Musculoskeletal:  Negative for arthralgias, back pain, flank pain, myalgias and neck pain.  Skin:  Negative for itching and rash.  Neurological:  Negative for dizziness, headaches and numbness.  Hematological:  Does not bruise/bleed easily.  Psychiatric/Behavioral:  Negative for depression, sleep disturbance and suicidal ideas. The patient is not nervous/anxious.   All other systems reviewed and are negative.    VITALS:   Blood pressure 139/83, pulse 75, temperature 98.6 F (37 C), temperature source Oral, resp. rate 17, weight 276 lb 1.6 oz (125.2 kg), SpO2 97 %.  Wt Readings from Last 3 Encounters:  03/05/23 276 lb 1.6 oz (125.2 kg)  02/03/23 283 lb 9.6 oz (128.6 kg)  11/20/22 279 lb 3.2 oz (126.6 kg)    Body mass index is 38.51 kg/m.  Performance status  (ECOG): 0 - Asymptomatic  PHYSICAL EXAM:   Physical Exam Vitals and nursing note reviewed. Exam conducted with a chaperone present.  Constitutional:      Appearance: Normal appearance.  Abdominal:     Palpations: There is no hepatomegaly, splenomegaly or mass.     Tenderness: There is no abdominal tenderness.  Musculoskeletal:     Right lower leg: No edema.     Left lower leg: No edema.  Neurological:     Mental Status: He is alert.     LABS:      Latest Ref Rng & Units 02/03/2023   12:13 PM 03/21/2021   11:40 AM 11/12/2016    9:33 AM  CBC  WBC 4.0 - 10.5 K/uL 3.7  2.8  4.2   Hemoglobin 13.0 - 17.0 g/dL 56.3  87.5  64.3   Hematocrit 39.0 - 52.0 % 42.6  43.4  44.8   Platelets 150 - 400 K/uL 62  51  108       Latest Ref Rng & Units 12/16/2022    3:57 PM 07/04/2021   11:28 AM 04/26/2021    9:43 AM  CMP  Glucose 70 - 99 mg/dL  329  518   BUN 6 - 24 mg/dL  11  10   Creatinine 8.41 - 1.24 mg/dL 6.60  6.30  1.60   Sodium 134 - 144 mmol/L  143  142   Potassium 3.5 - 5.2 mmol/L  3.6  3.9   Chloride 96 - 106 mmol/L  106  105   CO2 20 - 29 mmol/L  23  21   Calcium 8.7 - 10.2 mg/dL  9.1  8.7   Total Protein 6.0 - 8.5 g/dL  7.1  7.1   Total Bilirubin 0.0 - 1.2 mg/dL  0.8  0.8   Alkaline Phos 44 - 121 IU/L  164  199   AST 0 - 40 IU/L  70  95   ALT 0 - 44 IU/L  61  72      No results found for: "CEA1", "CEA" / No results found for: "CEA1", "CEA" No results found for: "PSA1" No results found for: "FUX323" No results  found for: "CAN125"  Lab Results  Component Value Date   TOTALPROTELP 6.7 02/03/2023   ALBUMINELP 3.4 02/03/2023   A1GS 0.2 02/03/2023   A2GS 0.6 02/03/2023   BETS 1.3 02/03/2023   GAMS 1.2 02/03/2023   MSPIKE Not Observed 02/03/2023   SPEI Comment 02/03/2023   Lab Results  Component Value Date   TIBC 326 07/04/2021   FERRITIN 180 07/04/2021   IRONPCTSAT 23 07/04/2021   Lab Results  Component Value Date   LDH 174 02/03/2023     STUDIES:   DG  Knee 3 Views Left  Result Date: 02/28/2023 CLINICAL DATA:  Stepped in a hole about 2-3 weeks ago. Knee pain and swelling. EXAM: LEFT KNEE - 3 VIEW COMPARISON:  None Available. FINDINGS: Mild medial compartment joint space narrowing with minimal peripheral degenerative osteophytes. Mild patellofemoral joint space narrowing and superior and inferior degenerative osteophytosis. Mild-to-moderate joint effusion. Moderate chronic enthesopathic change at the patellar tendon origin at the inferior patella. No acute fracture or dislocation. IMPRESSION: 1. Mild medial and patellofemoral compartment osteoarthritis. 2. Mild-to-moderate joint effusion. Electronically Signed   By: Neita Garnet M.D.   On: 02/28/2023 17:33

## 2023-03-05 ENCOUNTER — Inpatient Hospital Stay: Payer: 59 | Attending: Hematology | Admitting: Hematology

## 2023-03-05 VITALS — BP 139/83 | HR 75 | Temp 98.6°F | Resp 17 | Wt 276.1 lb

## 2023-03-05 DIAGNOSIS — K766 Portal hypertension: Secondary | ICD-10-CM | POA: Diagnosis not present

## 2023-03-05 DIAGNOSIS — Z79899 Other long term (current) drug therapy: Secondary | ICD-10-CM | POA: Insufficient documentation

## 2023-03-05 DIAGNOSIS — R161 Splenomegaly, not elsewhere classified: Secondary | ICD-10-CM | POA: Insufficient documentation

## 2023-03-05 DIAGNOSIS — Z8379 Family history of other diseases of the digestive system: Secondary | ICD-10-CM | POA: Diagnosis not present

## 2023-03-05 DIAGNOSIS — Z803 Family history of malignant neoplasm of breast: Secondary | ICD-10-CM | POA: Diagnosis not present

## 2023-03-05 DIAGNOSIS — K7460 Unspecified cirrhosis of liver: Secondary | ICD-10-CM | POA: Insufficient documentation

## 2023-03-05 DIAGNOSIS — D696 Thrombocytopenia, unspecified: Secondary | ICD-10-CM | POA: Diagnosis present

## 2023-03-05 DIAGNOSIS — Z87891 Personal history of nicotine dependence: Secondary | ICD-10-CM | POA: Diagnosis not present

## 2023-03-05 DIAGNOSIS — M1990 Unspecified osteoarthritis, unspecified site: Secondary | ICD-10-CM | POA: Insufficient documentation

## 2023-03-05 NOTE — Patient Instructions (Addendum)
[facility]  Discharge Instructions  You were seen and examined today by Dr. Ellin Saba.  Dr. Ellin Saba discussed your most recent lab work which revealed that everything looks stable.  Follow up with the Gastrologist about your Liver.  Follow-up as scheduled in 6 months.    Thank you for choosing [facility] to provide your oncology and hematology care.   To afford each patient quality time with our provider, please arrive at least 15 minutes before your scheduled appointment time. You may need to reschedule your appointment if you arrive late (10 or more minutes). Arriving late affects you and other patients whose appointments are after yours.  Also, if you miss three or more appointments without notifying the office, you may be dismissed from the clinic at the provider's discretion.    Again, thank you for choosing [facility].  Our hope is that these requests will decrease the amount of time that you wait before being seen by our physicians.   If you have a lab appointment with the Cancer Center - please note that after April 8th, all labs will be drawn in the cancer center.  You do not have to check in or register with the main entrance as you have in the past but will complete your check-in at the cancer center.            _____________________________________________________________  Should you have questions after your visit to [facility], please contact our office at [phone] and follow the prompts.  Our office hours are 8:00 a.m. to 4:30 p.m. Monday - Thursday and 8:00 a.m. to 2:30 p.m. Friday.  Please note that voicemails left after 4:00 p.m. may not be returned until the following business day.  We are closed weekends and all major holidays.  You do have access to a nurse 24-7, just call the main number to the clinic [phone] and do not press any options, hold on the line and a nurse will  answer the phone.    For prescription refill requests, have your pharmacy contact our office and allow 72 hours.    Masks are no longer required in the cancer centers. If you would like for your care team to wear a mask while they are taking care of you, please let them know. You may have one support person who is at least 57 years old accompany you for your appointments.

## 2023-04-29 ENCOUNTER — Ambulatory Visit (INDEPENDENT_AMBULATORY_CARE_PROVIDER_SITE_OTHER): Payer: 59 | Admitting: Orthopaedic Surgery

## 2023-04-29 ENCOUNTER — Encounter: Payer: Self-pay | Admitting: Orthopaedic Surgery

## 2023-04-29 VITALS — BP 125/80 | HR 81 | Ht 71.0 in | Wt 277.4 lb

## 2023-04-29 DIAGNOSIS — Q6689 Other  specified congenital deformities of feet: Secondary | ICD-10-CM | POA: Diagnosis not present

## 2023-04-29 DIAGNOSIS — M25562 Pain in left knee: Secondary | ICD-10-CM

## 2023-04-29 DIAGNOSIS — G8929 Other chronic pain: Secondary | ICD-10-CM

## 2023-04-29 NOTE — Patient Instructions (Addendum)
To schedule the MRI please go ahead and call to schedule your appointment with [facility] within at least one (1) week. If they do not have any appointments available ask if one of the [facility] can get you in sooner, sometimes [facility] or [facility] may have sooner [employer] [phone]

## 2023-04-29 NOTE — Progress Notes (Signed)
Subjective:    Patient ID: [name] [name], male    DOB: 06-01-1966, 57 y.o.   MRN: [id number]  HPI He has had pain in the left knee for over four to five months.  It is getting worse.  He has seen his family doctor several times for this beginning in lat May.  He had X-rays of the knee on 02-28-23 which I have reviewed.  He was last seen at the [facility] on 04-24-23.  I have reviewed the notes.  He is not getting better.  He has been on NSAIDs and prednisone by mouth with little help.  He has more pain anteriorly and laterally.  He has swelling and popping but no giving way yet.  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 Because of the right limited motion of the ankle, he has depended on the left knee and left side to walk better.  Now he has limitations in walking.  He is not improving and feels he is getting worse.  He would like something done.  I have independently reviewed and interpreted x-rays of this patient done at another site by another physician or qualified health professional.    Review of Systems  Constitutional:  Positive for activity change.  Musculoskeletal:  Positive for arthralgias, gait problem, joint swelling and myalgias.  All other systems reviewed and are negative. For Review of Systems, all other systems reviewed and are negative.  The following is a summary of the past history medically, past history surgically, known current medicines, social history and family history.  This information is gathered electronically by the computer from prior information and documentation.  I review this each visit and have found including this information at this point in the chart is beneficial and informative.   Past Medical History:  Diagnosis Date   Arthritis    Cirrhosis (HCC)    Depression    Diabetes (HCC)    Umbilical hernia     Past Surgical History:  Procedure Laterality Date   BIOPSY  03/27/2021   Procedure: BIOPSY;  Surgeon:  Lanelle Bal, DO;  [facility];  Service: Endoscopy;;  gastric   COLONOSCOPY N/A 05/14/2016   Procedure: COLONOSCOPY;  Surgeon: West Bali, MD;  [facility];  Service: Endoscopy;  Laterality: N/A;  1000 - moved to 1:30 - office to notify pt   COLONOSCOPY WITH PROPOFOL N/A 03/27/2021   Procedure: COLONOSCOPY WITH PROPOFOL;  Surgeon: Lanelle Bal, DO;  [facility];  Service: Endoscopy;  Laterality: N/A;  1:30pm   ESOPHAGOGASTRODUODENOSCOPY N/A 11/01/2015   Procedure: ESOPHAGOGASTRODUODENOSCOPY (EGD);  Surgeon: West Bali, MD;  [facility];  Service: Endoscopy;  Laterality: N/A;  245 - moved to 2/1 - office to notify    ESOPHAGOGASTRODUODENOSCOPY (EGD) WITH PROPOFOL N/A 03/27/2021   Procedure: ESOPHAGOGASTRODUODENOSCOPY (EGD) WITH PROPOFOL;  Surgeon: Lanelle Bal, DO;  [facility];  Service: Endoscopy;  Laterality: N/A;   FOOT SURGERY Right    club foot repair   HAND SURGERY Right    tendon repair   KNEE SURGERY Right    POLYPECTOMY  03/27/2021   Procedure: POLYPECTOMY;  Surgeon: Lanelle Bal, DO;  [facility];  Service: Endoscopy;;    Current Outpatient Medications on File Prior to Visit  Medication Sig Dispense Refill   atorvastatin (LIPITOR) 10 MG tablet Take 10 mg by mouth daily.  metFORMIN (GLUCOPHAGE) 500 MG tablet Take 500 mg by mouth daily.     OZEMPIC, 2 MG/DOSE, 8 MG/3ML SOPN Inject 2 mg into the skin once a week.     methylPREDNISolone (MEDROL DOSEPAK) 4 MG TBPK tablet Take by mouth as directed. (Patient not taking: Reported on 04/29/2023)     No current facility-administered medications on file prior to visit.    Social History   Socioeconomic History   Marital status: Married    Spouse name: Not on file   Number of children: Not on file   Years of education: Not on file   Highest education level: Not on file  Occupational History   Not on file  Tobacco Use   Smoking  status: Former    Current packs/day: 0.00    Average packs/day: 0.3 packs/day for 10.0 years (2.5 ttl pk-yrs)    Types: Cigarettes    Start date: 02/26/1989    Quit date: 02/27/1999    Years since quitting: 24.1    Passive exposure: Past   Smokeless tobacco: Never  Vaping Use   Vaping status: Never Used  Substance and Sexual Activity   Alcohol use: No   Drug use: No   Sexual activity: Never  Other Topics Concern   Not on file  Social History Narrative   Not on file   Social Determinants of Health   Financial Resource Strain: Not on file  Food Insecurity: No Food Insecurity (02/03/2023)   Hunger Vital Sign    Worried About Running Out of Food in the Last Year: Never true    Ran Out of Food in the Last Year: Never true  Transportation Needs: No Transportation Needs (02/03/2023)   PRAPARE - Administrator, Civil Service (Medical): No    Lack of Transportation (Non-Medical): No  Physical Activity: Not on file  Stress: Not on file  Social Connections: Not on file  Intimate Partner Violence: Not At Risk (02/03/2023)   Humiliation, Afraid, Rape, and Kick questionnaire    Fear of Current or Ex-Partner: No    Emotionally Abused: No    Physically Abused: No    Sexually Abused: No    Family History  Problem Relation Age of Onset   Colon cancer Neg Hx    Colon polyps Neg Hx    Stomach cancer Neg Hx    Pancreatic cancer Neg Hx     BP 125/80   Pulse 81   Ht 5\' 11"  (1.803 m)   Wt 277 lb 6.4 oz (125.8 kg)   BMI 38.69 kg/m   Body mass index is 38.69 kg/m.      Objective:   Physical Exam Vitals and nursing note reviewed. Exam conducted with a chaperone present.  Constitutional:      Appearance: He is well-developed.  HENT:     Head: Normocephalic and atraumatic.  Eyes:     Conjunctiva/sclera: Conjunctivae normal.     Pupils: Pupils are equal, round, and reactive to light.  Cardiovascular:     Rate and Rhythm: Normal rate and regular rhythm.  Pulmonary:      Effort: Pulmonary effort is normal.  Abdominal:     Palpations: Abdomen is soft.  Musculoskeletal:     Cervical back: Normal range of motion and neck supple.       Legs:  Skin:    General: Skin is warm and dry.  Neurological:     Mental Status: He is alert and oriented to person, place, and time.  Cranial Nerves: No cranial nerve deficit.     Motor: No abnormal muscle tone.     Coordination: Coordination normal.     Deep Tendon Reflexes: Reflexes are normal and symmetric. Reflexes normal.  Psychiatric:        Behavior: Behavior normal.        Thought Content: Thought content normal.        Judgment: Judgment normal.           Assessment & Plan:   Encounter Diagnoses  Name Primary?   Chronic pain of left knee Yes   Right club foot    I am concerned about a meniscus tear of the left knee, possible ligamentous tear also.  I will get MRI of the left knee.  Return in two weeks.  If MRI not done before then, then in one month.  Call if any problem.  Precautions discussed.  Electronically Signed Darreld Mclean, MD 7/30/202410:41 AM

## 2023-05-09 ENCOUNTER — Ambulatory Visit (HOSPITAL_BASED_OUTPATIENT_CLINIC_OR_DEPARTMENT_OTHER)
Admission: RE | Admit: 2023-05-09 | Discharge: 2023-05-09 | Disposition: A | Discharge status: Home / Self Care | Payer: HMO | Source: Ambulatory Visit | Attending: Orthopaedic Surgery | Admitting: Orthopaedic Surgery

## 2023-05-09 DIAGNOSIS — G8929 Other chronic pain: Secondary | ICD-10-CM | POA: Diagnosis not present

## 2023-05-09 DIAGNOSIS — M1712 Unilateral primary osteoarthritis, left knee: Secondary | ICD-10-CM | POA: Insufficient documentation

## 2023-05-09 DIAGNOSIS — M7122 Synovial cyst of popliteal space [Baker], left knee: Secondary | ICD-10-CM | POA: Insufficient documentation

## 2023-05-09 DIAGNOSIS — M25562 Pain in left knee: Secondary | ICD-10-CM | POA: Diagnosis not present

## 2023-05-09 DIAGNOSIS — R609 Edema, unspecified: Secondary | ICD-10-CM | POA: Diagnosis not present

## 2023-05-13 ENCOUNTER — Encounter: Payer: Self-pay | Admitting: Orthopaedic Surgery

## 2023-05-13 ENCOUNTER — Ambulatory Visit (INDEPENDENT_AMBULATORY_CARE_PROVIDER_SITE_OTHER): Payer: HMO | Admitting: Orthopaedic Surgery

## 2023-05-13 VITALS — Ht 71.0 in | Wt 277.0 lb

## 2023-05-13 DIAGNOSIS — Q6689 Other  specified congenital deformities of feet: Secondary | ICD-10-CM | POA: Diagnosis not present

## 2023-05-13 DIAGNOSIS — M23304 Other meniscus derangements, unspecified medial meniscus, left knee: Secondary | ICD-10-CM

## 2023-05-13 DIAGNOSIS — M23222 Derangement of posterior horn of medial meniscus due to old tear or injury, left knee: Secondary | ICD-10-CM

## 2023-05-13 DIAGNOSIS — G8929 Other chronic pain: Secondary | ICD-10-CM

## 2023-05-13 NOTE — Progress Notes (Signed)
My knee still hurts.  He had MRI of the left knee showing: IMPRESSION: 1. Degeneration of the posterior horn of the medial meniscus with a complex tear of the posterior horn. Horizontal tear of the body of the medial meniscus extending to the inferior articular surface. 2. Tricompartmental cartilage abnormalities as described above. 3. Moderate joint effusion. 4. Mild edema in superolateral Hoffa's fat as can be seen with patellar tendon-lateral femoral condyle friction syndrome.  I have explained the findings to him.  I have shown him a model.  I have recommended he see Dr. Romeo Apple for arthroscopy.  I have explained the procedure to him.  He is agreeable.  I have independently reviewed the MRI.    Left knee has slight effusion, ROM 0 to 110, positive medial McMurray, limp right, crepitus, NV intact.  No distal edema.  Encounter Diagnoses  Name Primary?   Chronic pain of left knee Yes   Right club foot    I will have Dr. Romeo Apple see him.  Call if any problem.  Precautions discussed.  Electronically Signed Darreld Mclean, MD 8/13/20249:23 AM

## 2023-05-13 NOTE — Patient Instructions (Signed)
Surgical consult Dr Romeo Apple for left knee

## 2023-05-16 ENCOUNTER — Other Ambulatory Visit (HOSPITAL_BASED_OUTPATIENT_CLINIC_OR_DEPARTMENT_OTHER): Payer: 59

## 2023-05-21 ENCOUNTER — Ambulatory Visit (INDEPENDENT_AMBULATORY_CARE_PROVIDER_SITE_OTHER): Payer: HMO | Admitting: Internal Medicine

## 2023-05-21 ENCOUNTER — Encounter: Payer: Self-pay | Admitting: Internal Medicine

## 2023-05-21 ENCOUNTER — Encounter: Payer: Self-pay | Admitting: *Deleted

## 2023-05-21 VITALS — BP 133/83 | HR 75 | Temp 97.8°F | Ht 71.0 in | Wt 278.0 lb

## 2023-05-21 DIAGNOSIS — K7581 Nonalcoholic steatohepatitis (NASH): Secondary | ICD-10-CM

## 2023-05-21 DIAGNOSIS — K429 Umbilical hernia without obstruction or gangrene: Secondary | ICD-10-CM

## 2023-05-21 DIAGNOSIS — K7460 Unspecified cirrhosis of liver: Secondary | ICD-10-CM

## 2023-05-21 DIAGNOSIS — Z6838 Body mass index (BMI) 38.0-38.9, adult: Secondary | ICD-10-CM

## 2023-05-21 NOTE — Progress Notes (Signed)
Referring Provider: Pearson Grippe, MD Primary Care Physician:  Kara Pacer, NP Primary GI:  Dr. Marletta Lor  Chief Complaint  Patient presents with   Cirrhosis    6 month follow up on cirrhosis. States doing well and no concerns.     HPI:   [name] [name] is a 57 y.o. male who presents to the clinic today for follow-up visit.  He has a history of well compensated cirrhosis presumably due to [facility]  Previous serological workup negative 06/2021.   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  Has chronically elevated aminotransferases and alkaline phosphatase.  Patient denies any confusion or severe fatigue.  No melena hematochezia.  No abdominal or leg swelling.  Patient does not drink alcohol.  EGD 03/27/2021 without esophageal varices.  Gastric mucosa appeared inflamed but biopsies negative.  Colonoscopy 03/27/2021 with 1 tubular adenoma removed.  Recommended recall 02/2026.  HCC screening:  Ultrasound 03/06/2021 showed potential liver lesion.    MRI 03/22/2021 multiple LIRADS 3 lesions of the liver with recommended 42-month follow-up MRI. AFP WNL.   MRI 02/11/22 showed resolution of previously noted lesions. Did show portal HTN findings with splenomegaly, gastric and distal esophageal varices.   [place] 12/03/22 cirrhosis without hepatoma.  Cholelithiasis without cholecystitis.  Shadowing focus seen between the gallbladder and liver of uncertain etiology.  Recommended CT to further evaluate.  CT abd.pelvis 12/03/22 no finding to correspond with shattering echogenic focus on ultrasound.  Likely from intraluminal gas within the duodenal bulb.  Cirrhosis with portal hypertension.  Cholelithiasis  Today, patient states he is doing well.  No complaints.    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  Past Medical History:  Diagnosis Date   Arthritis    Cirrhosis (HCC)    Depression    Diabetes (HCC)    Umbilical hernia      Past Surgical History:  Procedure Laterality Date   BIOPSY  03/27/2021   Procedure: BIOPSY;  Surgeon: Lanelle Bal, DO;  [facility];  Service: Endoscopy;;  gastric   COLONOSCOPY N/A 05/14/2016   Procedure: COLONOSCOPY;  Surgeon: West Bali, MD;  [facility];  Service: Endoscopy;  Laterality: N/A;  1000 - moved to 1:30 - office to notify pt   COLONOSCOPY WITH PROPOFOL N/A 03/27/2021   Procedure: COLONOSCOPY WITH PROPOFOL;  Surgeon: Lanelle Bal, DO;  [facility];  Service: Endoscopy;  Laterality: N/A;  1:30pm   ESOPHAGOGASTRODUODENOSCOPY N/A 11/01/2015   Procedure: ESOPHAGOGASTRODUODENOSCOPY (EGD);  Surgeon: West Bali, MD;  [facility];  Service: Endoscopy;  Laterality: N/A;  245 - moved to 2/1 - office to notify    ESOPHAGOGASTRODUODENOSCOPY (EGD) WITH PROPOFOL N/A 03/27/2021   Procedure: ESOPHAGOGASTRODUODENOSCOPY (EGD) WITH PROPOFOL;  Surgeon: Lanelle Bal, DO;  [facility];  Service: Endoscopy;  Laterality: N/A;   FOOT SURGERY Right    club foot repair   HAND SURGERY Right    tendon repair   KNEE SURGERY Right    POLYPECTOMY  03/27/2021   Procedure: POLYPECTOMY;  Surgeon: Lanelle Bal, DO;  [facility];  Service: Endoscopy;;    Current Outpatient Medications  Medication Sig Dispense Refill   atorvastatin (LIPITOR) 10 MG tablet Take 10 mg by mouth daily.     metFORMIN (GLUCOPHAGE) 500 MG tablet Take 500 mg by mouth daily.     OZEMPIC, 2 MG/DOSE, 8 MG/3ML SOPN Inject 2  mg into the skin once a week.     No current facility-administered medications for this visit.    Allergies as of 05/21/2023   (No Known Allergies)    Family History  Problem Relation Age of Onset   Colon cancer Neg Hx    Colon polyps Neg Hx    Stomach cancer Neg Hx    Pancreatic cancer Neg Hx     Social History   Socioeconomic History   Marital status: Married    Spouse name: Not on file   Number of  children: Not on file   Years of education: Not on file   Highest education level: Not on file  Occupational History   Not on file  Tobacco Use   Smoking status: Former    Current packs/day: 0.00    Average packs/day: 0.3 packs/day for 10.0 years (2.5 ttl pk-yrs)    Types: Cigarettes    Start date: 02/26/1989    Quit date: 02/27/1999    Years since quitting: 24.2    Passive exposure: Past   Smokeless tobacco: Never  Vaping Use   Vaping status: Never Used  Substance and Sexual Activity   Alcohol use: No   Drug use: No   Sexual activity: Never  Other Topics Concern   Not on file  Social History Narrative   Not on file   Social Determinants of Health   Financial Resource Strain: Not on file  Food Insecurity: No Food Insecurity (02/03/2023)   Hunger Vital Sign    Worried About Running Out of Food in the Last Year: Never true    Ran Out of Food in the Last Year: Never true  Transportation Needs: No Transportation Needs (02/03/2023)   PRAPARE - Administrator, Civil Service (Medical): No    Lack of Transportation (Non-Medical): No  Physical Activity: Not on file  Stress: Not on file  Social Connections: Not on file    Subjective: Review of Systems  Constitutional:  Negative for chills and fever.  HENT:  Negative for congestion and hearing loss.   Eyes:  Negative for blurred vision and double vision.  Respiratory:  Negative for cough and shortness of breath.   Cardiovascular:  Negative for chest pain and palpitations.  Gastrointestinal:  Negative for abdominal pain, blood in stool, constipation, diarrhea, heartburn, melena and vomiting.  Genitourinary:  Negative for dysuria and urgency.  Musculoskeletal:  Negative for joint pain and myalgias.  Skin:  Negative for itching and rash.  Neurological:  Negative for dizziness and headaches.  Psychiatric/Behavioral:  Negative for depression. The patient is not nervous/anxious.      Objective: BP 133/83   Pulse 75    Temp 97.8 F (36.6 C) (Oral)   Ht 5\' 11"  (1.803 m)   Wt 278 lb (126.1 kg)   BMI 38.77 kg/m  Physical Exam Constitutional:      Appearance: Normal appearance. He is obese.  HENT:     Head: Normocephalic and atraumatic.  Eyes:     Extraocular Movements: Extraocular movements intact.     Conjunctiva/sclera: Conjunctivae normal.  Cardiovascular:     Rate and Rhythm: Normal rate and regular rhythm.  Pulmonary:     Effort: Pulmonary effort is normal.     Breath sounds: Normal breath sounds.  Abdominal:     General: Bowel sounds are normal.     Palpations: Abdomen is soft.  Musculoskeletal:        General: Normal range of motion.  Cervical back: Normal range of motion and neck supple.  Skin:    General: Skin is warm.  Neurological:     General: No focal deficit present.     Mental Status: He is alert and oriented to person, place, and time.  Psychiatric:        Mood and Affect: Mood normal.        Behavior: Behavior normal.      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    RUQ [place] ordered for 06/2023 for [facility] screening. Check AFP.   No evidence of hypervolemia on exam today.  Counseled on low-sodium diet.  No history of encephalopathy.  Due for repeat EGD for variceal screening. Will schedule today. The risks including infection, bleed, or perforation as well as benefits, limitations, alternatives and imponderables have been reviewed with the patient. Potential for esophageal dilation, biopsy, etc. have also been reviewed.  Questions have been answered. All parties agreeable.  Discussed MASH in depth with him today.  Counseled on the vast importance of keeping his diabetes and dyslipidemia under good control.  Also recommended weight loss of at least 10% which would be 29 pounds.  Recommended he institute 30 minutes of exercise 5  days a week.  States his biggest issue is with snacking throughout the day on his kids foods including butter fingers.    Patient counseled on avoiding heavy lifting or other strenuous exercises/activities. Work on weight loss.  If patient has significant pain in the region, redness or tenderness, and is unable to reduce the hernia while lying flat, recommend going to the ER for further evaluation.   Follow-up in 6 months.   05/21/2023 11:01 AM   Disclaimer: This note was dictated with voice recognition software. Similar sounding words can inadvertently be transcribed and may not be corrected upon review.

## 2023-05-21 NOTE — Addendum Note (Signed)
Addended by: Armstead Peaks on: 05/21/2023 01:33 PM   Modules accepted: Orders

## 2023-05-21 NOTE — Patient Instructions (Signed)
I am going to order blood work today at [facility] to check your liver numbers.  We will call with results.  You will be due for ultrasound next month for liver cancer screening.  We will call with results.  We will schedule you for upper endoscopy for variceal screening.  We do these every 2 years.  Otherwise follow-up in 6 months.  Continue to work on weight loss.  It was very nice seeing you again today.  Dr. Marletta Lor

## 2023-05-23 LAB — COMPLETE METABOLIC PANEL WITH GFR
AG Ratio: 1.4 (calc) (ref 1.0–2.5)
ALT: 44 U/L (ref 9–46)
AST: 59 U/L — ABNORMAL HIGH (ref 10–35)
Albumin: 4 g/dL (ref 3.6–5.1)
Alkaline phosphatase (APISO): 183 U/L — ABNORMAL HIGH (ref 35–144)
BUN: 8 mg/dL (ref 7–25)
CO2: 26 mmol/L (ref 20–32)
Calcium: 9.2 mg/dL (ref 8.6–10.3)
Chloride: 106 mmol/L (ref 98–110)
Creat: 0.77 mg/dL (ref 0.70–1.30)
Globulin: 2.9 g/dL (ref 1.9–3.7)
Glucose, Bld: 252 mg/dL — ABNORMAL HIGH (ref 65–99)
Potassium: 3.9 mmol/L (ref 3.5–5.3)
Sodium: 140 mmol/L (ref 135–146)
Total Bilirubin: 1.1 mg/dL (ref 0.2–1.2)
Total Protein: 6.9 g/dL (ref 6.1–8.1)
eGFR: 104 mL/min/{1.73_m2} (ref >=60)

## 2023-05-23 LAB — PROTIME-INR
INR: 1.1
Prothrombin Time: 11.8 s — ABNORMAL HIGH (ref 9.0–11.5)

## 2023-05-23 LAB — AFP TUMOR MARKER: AFP-Tumor Marker: 6.3 ng/mL — ABNORMAL HIGH (ref <6.1)

## 2023-06-05 ENCOUNTER — Ambulatory Visit (INDEPENDENT_AMBULATORY_CARE_PROVIDER_SITE_OTHER): Payer: HMO | Admitting: Orthopedic Surgery

## 2023-06-05 ENCOUNTER — Encounter: Payer: Self-pay | Admitting: Orthopedic Surgery

## 2023-06-05 VITALS — BP 121/77 | HR 75 | Ht 71.0 in | Wt 275.8 lb

## 2023-06-05 DIAGNOSIS — M1712 Unilateral primary osteoarthritis, left knee: Secondary | ICD-10-CM

## 2023-06-05 DIAGNOSIS — M23322 Other meniscus derangements, posterior horn of medial meniscus, left knee: Secondary | ICD-10-CM | POA: Diagnosis not present

## 2023-06-05 DIAGNOSIS — Z01818 Encounter for other preprocedural examination: Secondary | ICD-10-CM

## 2023-06-05 NOTE — Addendum Note (Signed)
Addended byCaffie Damme on: 06/05/2023 02:38 PM   Modules accepted: Orders

## 2023-06-05 NOTE — Patient Instructions (Addendum)
Your surgery will be at [facility] by Dr Romeo Apple  The hospital will contact you with a preoperative appointment to discuss Anesthesia.  Please arrive on time or 15 minutes early for the preoperative appointment, they have a very tight schedule if you are late or do not come in your surgery will be cancelled.  The phone number is [phone]. Please bring your medications with you for the appointment. They will tell you the arrival time and medication instructions when you have your preoperative evaluation. Do not wear nail polish the day of your surgery and if you take Phentermine you need to stop this medication ONE WEEK prior to your surgery. If you take Docia Barrier, Jardiance, or Steglatro) - Hold 72 hours before the procedure.  If you take Ozempic,  Mounjaro, Bydureon or Trulicity do not take for 8 days before your surgery. If you take Victoza, Rybelsis, Saxenda or Adlyxi stop 24 hours before the procedure.  Please arrive at the hospital 2 hours before procedure if scheduled at 9:30 or later in the day or at the time the nurse tells you at your preoperative visit.   If you have my chart do not use the time given in my chart use the time given to you by the nurse during your preoperative visit.   Your surgery  time may change. Please be available for phone calls the day of your surgery and the day before. The Short Stay department may need to discuss changes about your surgery time. Not reaching the you could lead to procedure delays and possible cancellation.  You must have a ride home and someone to stay with you for 24 to 48 hours. The person taking you home will receive and sign for the your discharge instructions.  Please be prepared to give your support person's name and telephone number to Central Registration. Dr Romeo Apple will need that name and phone number post procedure.  If you need to reschedule call office ask for Talea Manges

## 2023-06-05 NOTE — Progress Notes (Signed)
Preop evaluation  57 year old male presents for evaluation of left knee for possible surgery with a complaint of left knee pain  History he is 57 years old he has a right clubfoot status post multiple surgeries she complains of left knee pain after stepping in a hole back in March  Dr. Hilda Lias has been seeing him eventually got an MRI which showed a torn medial meniscus and arthritis of all 3 compartments  Over the last 3 weeks the pain is decreased but the swelling has persisted and is difficult for him to get out of a seated position  Prior to the injury had no history of knee pain left side  Review of systems is negative  Past Medical History:  Diagnosis Date   Arthritis    Cirrhosis (HCC)    Depression    Diabetes (HCC)    Umbilical hernia    Past Surgical History:  Procedure Laterality Date   BIOPSY  03/27/2021   Procedure: BIOPSY;  Surgeon: Lanelle Bal, DO;  Location: [facility];  Service: Endoscopy;;  gastric   COLONOSCOPY N/A 05/14/2016   Procedure: COLONOSCOPY;  Surgeon: West Bali, MD;  Location: [facility];  Service: Endoscopy;  Laterality: N/A;  1000 - moved to 1:30 - office to notify pt   COLONOSCOPY WITH PROPOFOL N/A 03/27/2021   Procedure: COLONOSCOPY WITH PROPOFOL;  Surgeon: Lanelle Bal, DO;  Location: [facility];  Service: Endoscopy;  Laterality: N/A;  1:30pm   ESOPHAGOGASTRODUODENOSCOPY N/A 11/01/2015   Procedure: ESOPHAGOGASTRODUODENOSCOPY (EGD);  Surgeon: West Bali, MD;  Location: [facility];  Service: Endoscopy;  Laterality: N/A;  245 - moved to 2/1 - office to notify    ESOPHAGOGASTRODUODENOSCOPY (EGD) WITH PROPOFOL N/A 03/27/2021   Procedure: ESOPHAGOGASTRODUODENOSCOPY (EGD) WITH PROPOFOL;  Surgeon: Lanelle Bal, DO;  Location: [facility];  Service: Endoscopy;  Laterality: N/A;   FOOT SURGERY Right    club foot repair   HAND SURGERY Right    tendon repair   KNEE SURGERY Right    POLYPECTOMY  03/27/2021   Procedure:  POLYPECTOMY;  Surgeon: Lanelle Bal, DO;  Location: [facility];  Service: Endoscopy;;   Family History  Problem Relation Age of Onset   Colon cancer Neg Hx    Colon polyps Neg Hx    Stomach cancer Neg Hx    Pancreatic cancer Neg Hx    Social History   Tobacco Use   Smoking status: Former    Current packs/day: 0.00    Average packs/day: 0.3 packs/day for 10.0 years (2.5 ttl pk-yrs)    Types: Cigarettes    Start date: 02/26/1989    Quit date: 02/27/1999    Years since quitting: 24.2    Passive exposure: Past   Smokeless tobacco: Never  Vaping Use   Vaping status: Never Used  Substance Use Topics   Alcohol use: No   Drug use: No   No Known Allergies  Physical Exam Vitals and nursing note reviewed.  Constitutional:      General: He is not in acute distress.    Appearance: Normal appearance. He is not ill-appearing, toxic-appearing or diaphoretic.  HENT:     Head: Normocephalic and atraumatic.     Nose: Nose normal. No congestion or rhinorrhea.  Eyes:     General: No scleral icterus.       Right eye: No discharge.        Left eye: No discharge.     Extraocular Movements:  Extraocular movements intact.     Conjunctiva/sclera: Conjunctivae normal.     Pupils: Pupils are equal, round, and reactive to light.  Cardiovascular:     Pulses: Normal pulses.  Pulmonary:     Effort: Pulmonary effort is normal.     Breath sounds: No wheezing.  Musculoskeletal:     Left knee: Effusion present.  Skin:    General: Skin is warm and dry.     Capillary Refill: Capillary refill takes less than 2 seconds.     Coloration: Skin is not jaundiced.     Findings: No erythema.  Neurological:     General: No focal deficit present.     Mental Status: He is alert and oriented to person, place, and time.  Psychiatric:        Mood and Affect: Mood normal.        Behavior: Behavior normal.        Thought Content: Thought content normal.        Judgment: Judgment normal.    Left Knee  Exam   Muscle Strength  The patient has normal left knee strength.  Tenderness  The patient is experiencing tenderness in the medial joint line.  Range of Motion  Extension:  normal  Flexion:  normal   Tests  Varus: negative Valgus: negative Drawer:  Anterior - negative     Posterior - negative  Other  Erythema: absent Scars: absent Sensation: normal Pulse: present Effusion: effusion present       Radiography  Image #1 knee medial joint space narrowing grade 1 arthritis  Image #2 MRI torn medial meniscus complex tear posterior horn with osteoarthritis medial compartment, lateral compartment, patellofemoral joint  Encounter Diagnoses  Name Primary?   Primary osteoarthritis of left knee Yes   Derangement of posterior horn of medial meniscus of left knee     Recommend arthroscopic surgery left knee medial meniscectomy  The procedure has been fully reviewed with the patient; The risks and benefits of surgery have been discussed and explained and understood. Alternative treatment has also been reviewed, questions were encouraged and answered. The postoperative plan is also been reviewed.  He understands that his arthritis may cause him persistent discomfort  Expect 4 to 6-week recovery

## 2023-06-20 ENCOUNTER — Ambulatory Visit (HOSPITAL_COMMUNITY)
Admission: RE | Admit: 2023-06-20 | Discharge: 2023-06-20 | Disposition: A | Discharge status: Home / Self Care | Payer: HMO | Source: Ambulatory Visit | Attending: Internal Medicine | Admitting: Internal Medicine

## 2023-06-20 DIAGNOSIS — K7460 Unspecified cirrhosis of liver: Secondary | ICD-10-CM | POA: Insufficient documentation

## 2023-06-26 NOTE — Patient Instructions (Signed)
[name] [name]  06/26/2023     @[email or web address] @   Your procedure is scheduled on 07/02/23.  Report to [facility] at 0600 A.M.  Call this number if you have problems the morning of surgery:  [phone]  If you experience any cold or flu symptoms such as cough, fever, chills, shortness of breath, etc. between now and your scheduled surgery, please notify us at the above number.   Remember:  Do not eat or drink after midnight.    Take these medicines the morning of surgery with A SIP OF WATER none needed.  LAST DOSE OF JARDIANCE SHOULD BE 06/30/23. LAST DOSE OF OZEMPIC SHOULD BE 06/24/23.  DO NOT TAKE ANY DIABETIC MEDICATION MORNING OF PROCEDURE.    Do not wear jewelry, make-up or nail polish, including gel polish,  artificial nails, or any other type of covering on natural nails (fingers and  toes).  Do not wear lotions, powders, or perfumes, or deodorant.  Do not shave 48 hours prior to surgery.  Men may shave face and neck.  Do not bring valuables to the hospital.  [facility] is not responsible for any belongings or valuables.  Contacts, dentures or bridgework may not be worn into surgery.  Leave your suitcase in the car.  After surgery it may be brought to your room.  For patients admitted to the hospital, discharge time will be determined by your treatment team.  Patients discharged the day of surgery will not be allowed to drive home.   Name and phone number of your driver:   family Special instructions:  n/a  Please read over the following fact sheets that you were given. Coughing and Deep Breathing, Anesthesia Post-op Instructions, and Care and Recovery After Surgery      PATIENT INSTRUCTIONS POST-ANESTHESIA  IMMEDIATELY FOLLOWING SURGERY:  Do not drive or operate machinery for the first twenty four hours after surgery.  Do not make any important decisions for twenty four hours after surgery or while taking narcotic pain medications or sedatives.  If you  develop intractable nausea and vomiting or a severe headache please notify your doctor immediately.  FOLLOW-UP:  Please make an appointment with your surgeon as instructed. You do not need to follow up with anesthesia unless specifically instructed to do so.  WOUND CARE INSTRUCTIONS (if applicable):  Keep a dry clean dressing on the anesthesia/puncture wound site if there is drainage.  Once the wound has quit draining you may leave it open to air.  Generally you should leave the bandage intact for twenty four hours unless there is drainage.  If the epidural site drains for more than 36-48 hours please call the anesthesia department.  QUESTIONS?:  Please feel free to call your physician or the hospital operator if you have any questions, and they will be happy to assist you.      Knee Arthroscopy Knee arthroscopy is a surgery to examine the inside of the knee joint and repair any damage to cartilage, surfaces, and other soft tissues around the joint. You may have this surgery if nonsurgical treatment has not relieved your symptoms. Knee arthroscopy may be used to: Repair a torn ligament or other torn tissues. Ligaments are tissues that connect bones to each other. Remove bone fragments. Remove a fluid-filled sac (cyst). Treat kneecap (patella)problems. Treat septic knee. This is an advanced infection in the knee. Arthroscopic surgery is done using a thin tube that has a light and camera on the end of it (arthroscope). The  arthroscope is placed through a small incision, and the camera sends images to a screen in the operating room. The images are used to help perform the surgery. Tell a health care provider about: Any allergies you have. All medicines you are taking, including vitamins, herbs, eye drops, creams, and over-the-counter medicines. Any problems you or family members have had with anesthetic medicines. Any blood disorders you have. Any surgeries you have had. Any medical conditions  you have. Whether you are pregnant or may be pregnant. What are the risks? Generally, this is a safe procedure. However, problems may occur, including: Infection. Bleeding. Allergic reactions to medicines. Damage to blood vessels, nerves, or tissues in the knee. A blood clot that forms in the leg and travels to the lung (pulmonary embolism). Failure of the surgery to relieve symptoms. Knee stiffness. What happens before the procedure? Staying hydrated Follow instructions from your health care provider about hydration, which may include: Up to 2 hours before the procedure - you may continue to drink clear liquids, such as water, clear fruit juice, black coffee, and plain tea.  Eating and drinking restrictions Follow instructions from your health care provider about eating and drinking, which may include: 8 hours before the procedure - stop eating heavy meals or foods, such as meat, fried foods, or fatty foods. 6 hours before the procedure - stop eating light meals or foods, such as toast or cereal. 6 hours before the procedure - stop drinking milk or drinks that contain milk. 2 hours before the procedure - stop drinking clear liquids. Medicines Ask your health care provider about: Changing or stopping your regular medicines. This is especially important if you are taking diabetes medicines or blood thinners. Taking medicines such as aspirin and ibuprofen. These medicines can thin your blood. Do not take these medicines unless your health care provider tells you to take them. Taking over-the-counter medicines, vitamins, herbs, and supplements. General instructions You may have a physical exam and tests, such as an X-ray, CT scan, or MRI. Do not drink alcohol unless your health care provider says that you can. Do not use any products that contain nicotine or tobacco for at least 4 weeks before the procedure. These products include cigarettes, e-cigarettes, and chewing tobacco. If you need  help quitting, ask your health care provider. Plan to have a responsible adult take you home from the hospital or clinic. If you will be going home right after the procedure, plan to have a responsible adult care for you for the time you are told. This is important. Ask your health care provider: How your surgery site will be marked. What steps will be taken to help prevent infection. These steps may include: Removing hair at the surgery site. Washing skin with a germ-killing soap. Taking antibiotic medicine. What happens during the procedure?  An IV will be inserted into one of your veins. You will be given one or more of the following: A medicine to help you relax (sedative). A medicine to numb the knee area (local anesthetic). A medicine to make you fall asleep (general anesthetic). A medicine that is injected into an area of your body to numb everything below the injection site (regional anesthetic). This may be injected into your groin or thigh. A cuff may be placed around your upper leg to slow blood flow to your lower leg during the procedure. Several small incisions will be made around your knee. Your knee joint will be rinsed (flushed) and filled with sterile saline. This  is a germ-free solution made of salt and water. This expands the area to help your surgeon see your joint more clearly. An arthroscope will be passed through one of your incisions, into your knee joint. Other surgical instruments will be passed through the other incisions. Then, your surgeon will examine and repair your knee as needed. The sterile saline will be drained from your knee, and the cuff will be removed from your upper leg. Your incisions will be closed with adhesive strips or stitches, also called sutures, and covered with a bandage (dressing). The procedure may vary among health care providers and hospitals. What happens after the procedure?  Your blood pressure, heart rate, breathing rate, and blood  oxygen level will be monitored until you leave the hospital or clinic. You will be given pain medicine as needed. You may be given medicine to lower your risk of blood clots. You may have to wear compression stockings. These stockings help to prevent blood clots and reduce swelling in your legs. You may be given a knee brace or immobilizer. Do not drive or use machinery until your health care provider approves. Summary Knee arthroscopy is a surgery to examine or repair the inside of your knee joint. Before the procedure, follow instructions from your health care provider about eating and drinking. Plan to have a responsible adult take you home from the hospital or clinic. This information is not intended to replace advice given to you by your health care provider. Make sure you discuss any questions you have with your health care provider. Document Revised: 01/17/2020 Document Reviewed: 01/17/2020 Elsevier Patient Education  2024 [employer] Knee Arthroscopy, Care After This sheet gives you information about how to care for yourself after your procedure. Your health care provider may also give you more specific instructions. If you have problems or questions, contact your health care provider. What can I expect after the procedure? After the procedure, it is common to have: Soreness. Swelling. Pain. A small amount of fluid from the incisions. Follow these instructions at home: Medicines Take over-the-counter and prescription medicines only as told by your health care provider. Ask your health care provider if the medicine prescribed to you: Requires you to avoid driving or using machinery. Can cause constipation. You may need to take these actions to prevent or treat constipation: Drink enough fluid to keep your urine pale yellow. Take over-the-counter or prescription medicines. Eat foods that are high in fiber, such as beans, whole grains, and fresh fruits and vegetables. Limit  foods that are high in fat and processed sugars, such as fried or sweet foods. If you have a brace or immobilizer: Wear it as told by your health care provider. Remove it only as told by your health care provider. Loosen it if your toes tingle, become numb, or turn cold and blue. Keep it clean and dry. Bathing Do not take baths, swim, or use a hot tub until your health care provider approves. Ask your health care provider if you may take showers. Keep your bandage (dressing) dry until your health care provider says that it can be removed. If the brace or immobilizer is not waterproof: Do not let it get wet. Cover it with a watertight covering when you take a bath or shower. Incision care  Follow instructions from your health care provider about how to take care of your incisions. Make sure you: Wash your hands with soap and water for at least 20 seconds before and after you change your  dressing. If soap and water are not available, use hand sanitizer. Change your dressing as told by your health care provider. Leave stitches (sutures) or adhesive strips in place. These skin closures may need to stay in place for 2 weeks or longer. If adhesive strip edges start to loosen and curl up, you may trim the loose edges. Do not remove adhesive strips completely unless your health care provider tells you to do that. Check your incision areas every day for signs of infection. Check for: Redness. More swelling or pain. Blood or more fluid. Warmth. Pus or a bad smell. Managing pain, stiffness, and swelling  If directed, put ice on the injured area. To do this: If you have a removable brace or immobilizer, remove it as told by your health care provider. Put ice in a plastic bag or use the icing device (cold therapy unit) that you were given. Follow instructions about how to use the icing device. Place a towel between your skin and the bag or between your skin and the icing device. Leave the ice on for  20 minutes, 2-3 times a day. Remove the ice if your skin turns bright red. This is very important. If you cannot feel pain, heat, or cold, you have a greater risk of damage to the area. Move your toes often to reduce stiffness and swelling. Raise (elevate) the injured area above the level of your heart while you are sitting or lying down. Activity Do not use your knee to support your body weight until your health care provider says that you can. Follow weight-bearing restrictions as told. Use crutches or other devices to help you move around (assistive devices) as told by your health care provider. Ask your health care provider what activities are safe for you during recovery, and what activities you need to avoid. If physical therapy was prescribed, do exercises as told by your health care provider. Doing exercises may help improve knee movement, range of motion, and flexibility. Do not lift anything that is heavier than 10 lb (4.5 kg), or the limit that you are told, until your health care provider says that it is safe. General instructions Do not drive until your health care provider approves. You may be able to drive after 1-3 weeks. Do not use any products that contain nicotine or tobacco, such as cigarettes, e-cigarettes, and chewing tobacco. These can delay incision or bone healing after surgery. If you need help quitting, ask your health care provider. Wear compression stockings as told by your health care provider. These stockings help to prevent blood clots and reduce swelling in your legs. Keep all follow-up visits. This is important. Contact a health care provider if: You have any of these signs of infection: Redness or more pain around an incision. Blood or more fluid coming from an incision. Warmth coming from an incision. Pus or a bad smell coming from an incision. More swelling in your knee. A fever or chills. You have severe knee pain, and medicine does not help. An incision  opens up. Get help right away if: You have trouble breathing or shortness of breath. You have chest pain. You develop pain or swelling in your lower leg or at the back of your knee. You have numbness or tingling in your lower leg or your foot. You notice that your foot or toes look darker than normal or are cooler than normal. These symptoms may represent a serious problem that is an emergency. Do not wait to see if  the symptoms will go away. Get medical help right away. Call your local emergency services (911 in the [place] Do not drive yourself to the hospital. Summary To help relieve pain and swelling, put ice on the injured area for 20 minutes, 2-3 times a day. Raise (elevate) the injured area above the level of your heart while you are sitting or lying down. If physical therapy was prescribed, do exercises as told by your health care provider. Exercises may help improve range of motion. This information is not intended to replace advice given to you by your health care provider. Make sure you discuss any questions you have with your health care provider. Document Revised: 01/17/2020 Document Reviewed: 01/17/2020 Elsevier Patient Education  2024 [employer] How to Use Chlorhexidine Before Surgery Chlorhexidine gluconate (CHG) is a germ-killing (antiseptic) solution that is used to clean the skin. It can get rid of the bacteria that normally live on the skin and can keep them away for about 24 hours. To clean your skin with CHG, you may be given: A CHG solution to use in the shower or as part of a sponge bath. A prepackaged cloth that contains CHG. Cleaning your skin with CHG may help lower the risk for infection: While you are staying in the intensive care unit of the hospital. If you have a vascular access, such as a central line, to provide short-term or long-term access to your veins. If you have a catheter to drain urine from your bladder. If you are on a ventilator. A ventilator is  a machine that helps you breathe by moving air in and out of your lungs. After surgery. What are the risks? Risks of using CHG include: A skin reaction. Hearing loss, if CHG gets in your ears and you have a perforated eardrum. Eye injury, if CHG gets in your eyes and is not rinsed out. The CHG product catching fire. Make sure that you avoid smoking and flames after applying CHG to your skin. Do not use CHG: If you have a chlorhexidine allergy or have previously reacted to chlorhexidine. On babies younger than 2 months of age. How to use CHG solution Use CHG only as told by your health care provider, and follow the instructions on the label. Use the full amount of CHG as directed. Usually, this is one bottle. During a shower Follow these steps when using CHG solution during a shower (unless your health care provider gives you different instructions): Start the shower. Use your normal soap and shampoo to wash your face and hair. Turn off the shower or move out of the shower stream. Pour the CHG onto a clean washcloth. Do not use any type of brush or rough-edged sponge. Starting at your neck, lather your body down to your toes. Make sure you follow these instructions: If you will be having surgery, pay special attention to the part of your body where you will be having surgery. Scrub this area for at least 1 minute. Do not use CHG on your head or face. If the solution gets into your ears or eyes, rinse them well with water. Avoid your genital area. Avoid any areas of skin that have broken skin, cuts, or scrapes. Scrub your back and under your arms. Make sure to wash skin folds. Let the lather sit on your skin for 1-2 minutes or as long as told by your health care provider. Thoroughly rinse your entire body in the shower. Make sure that all body creases and crevices are  rinsed well. Dry off with a clean towel. Do not put any substances on your body afterward--such as powder, lotion, or  perfume--unless you are told to do so by your health care provider. Only use lotions that are recommended by the manufacturer. Put on clean clothes or pajamas. If it is the night before your surgery, sleep in clean sheets.  During a sponge bath Follow these steps when using CHG solution during a sponge bath (unless your health care provider gives you different instructions): Use your normal soap and shampoo to wash your face and hair. Pour the CHG onto a clean washcloth. Starting at your neck, lather your body down to your toes. Make sure you follow these instructions: If you will be having surgery, pay special attention to the part of your body where you will be having surgery. Scrub this area for at least 1 minute. Do not use CHG on your head or face. If the solution gets into your ears or eyes, rinse them well with water. Avoid your genital area. Avoid any areas of skin that have broken skin, cuts, or scrapes. Scrub your back and under your arms. Make sure to wash skin folds. Let the lather sit on your skin for 1-2 minutes or as long as told by your health care provider. Using a different clean, wet washcloth, thoroughly rinse your entire body. Make sure that all body creases and crevices are rinsed well. Dry off with a clean towel. Do not put any substances on your body afterward--such as powder, lotion, or perfume--unless you are told to do so by your health care provider. Only use lotions that are recommended by the manufacturer. Put on clean clothes or pajamas. If it is the night before your surgery, sleep in clean sheets. How to use CHG prepackaged cloths Only use CHG cloths as told by your health care provider, and follow the instructions on the label. Use the CHG cloth on clean, dry skin. Do not use the CHG cloth on your head or face unless your health care provider tells you to. When washing with the CHG cloth: Avoid your genital area. Avoid any areas of skin that have broken  skin, cuts, or scrapes. Before surgery Follow these steps when using a CHG cloth to clean before surgery (unless your health care provider gives you different instructions): Using the CHG cloth, vigorously scrub the part of your body where you will be having surgery. Scrub using a back-and-forth motion for 3 minutes. The area on your body should be completely wet with CHG when you are done scrubbing. Do not rinse. Discard the cloth and let the area air-dry. Do not put any substances on the area afterward, such as powder, lotion, or perfume. Put on clean clothes or pajamas. If it is the night before your surgery, sleep in clean sheets.  For general bathing Follow these steps when using CHG cloths for general bathing (unless your health care provider gives you different instructions). Use a separate CHG cloth for each area of your body. Make sure you wash between any folds of skin and between your fingers and toes. Wash your body in the following order, switching to a new cloth after each step: The front of your neck, shoulders, and chest. Both of your arms, under your arms, and your hands. Your stomach and groin area, avoiding the genitals. Your right leg and foot. Your left leg and foot. The back of your neck, your back, and your buttocks. Do not rinse. Discard the  cloth and let the area air-dry. Do not put any substances on your body afterward--such as powder, lotion, or perfume--unless you are told to do so by your health care provider. Only use lotions that are recommended by the manufacturer. Put on clean clothes or pajamas. Contact a health care provider if: Your skin gets irritated after scrubbing. You have questions about using your solution or cloth. You swallow any chlorhexidine. Call your local poison control center ([phone] in the [place] Get help right away if: Your eyes itch badly, or they become very red or swollen. Your skin itches badly and is red or swollen. Your  hearing changes. You have trouble seeing. You have swelling or tingling in your mouth or throat. You have trouble breathing. These symptoms may represent a serious problem that is an emergency. Do not wait to see if the symptoms will go away. Get medical help right away. Call your local emergency services (911 in the [place] Do not drive yourself to the hospital. Summary Chlorhexidine gluconate (CHG) is a germ-killing (antiseptic) solution that is used to clean the skin. Cleaning your skin with CHG may help to lower your risk for infection. You may be given CHG to use for bathing. It may be in a bottle or in a prepackaged cloth to use on your skin. Carefully follow your health care provider's instructions and the instructions on the product label. Do not use CHG if you have a chlorhexidine allergy. Contact your health care provider if your skin gets irritated after scrubbing. This information is not intended to replace advice given to you by your health care provider. Make sure you discuss any questions you have with your health care provider. Document Revised: 01/14/2022 Document Reviewed: 11/27/2020 Elsevier Patient Education  2023 [employer].

## 2023-06-27 ENCOUNTER — Encounter (HOSPITAL_COMMUNITY)
Admission: RE | Admit: 2023-06-27 | Discharge: 2023-06-27 | Disposition: A | Discharge status: Home / Self Care | Payer: HMO | Source: Ambulatory Visit | Attending: Orthopedic Surgery | Admitting: Orthopedic Surgery

## 2023-06-27 ENCOUNTER — Other Ambulatory Visit: Payer: Self-pay

## 2023-06-27 ENCOUNTER — Encounter (HOSPITAL_COMMUNITY): Payer: Self-pay

## 2023-06-27 VITALS — BP 127/67 | HR 73 | Resp 17 | Ht 71.0 in | Wt 277.0 lb

## 2023-06-27 DIAGNOSIS — Z01818 Encounter for other preprocedural examination: Secondary | ICD-10-CM | POA: Diagnosis present

## 2023-06-27 DIAGNOSIS — E119 Type 2 diabetes mellitus without complications: Secondary | ICD-10-CM | POA: Insufficient documentation

## 2023-06-27 DIAGNOSIS — R9431 Abnormal electrocardiogram [ECG] [EKG]: Secondary | ICD-10-CM | POA: Diagnosis not present

## 2023-06-27 DIAGNOSIS — M23322 Other meniscus derangements, posterior horn of medial meniscus, left knee: Secondary | ICD-10-CM | POA: Diagnosis not present

## 2023-06-27 LAB — CBC WITH DIFFERENTIAL/PLATELET
Abs Immature Granulocytes: 0 10*3/uL (ref 0.00–0.07)
Basophils Absolute: 0 10*3/uL (ref 0.0–0.1)
Basophils Relative: 1 %
Eosinophils Absolute: 0.1 10*3/uL (ref 0.0–0.5)
Eosinophils Relative: 3 %
HCT: 44 % (ref 39.0–52.0)
Hemoglobin: 15.3 g/dL (ref 13.0–17.0)
Immature Granulocytes: 0 %
Lymphocytes Relative: 24 %
Lymphs Abs: 0.8 10*3/uL (ref 0.7–4.0)
MCH: 30.6 pg (ref 26.0–34.0)
MCHC: 34.8 g/dL (ref 30.0–36.0)
MCV: 88 fL (ref 80.0–100.0)
Monocytes Absolute: 0.2 10*3/uL (ref 0.1–1.0)
Monocytes Relative: 7 %
Neutro Abs: 2 10*3/uL (ref 1.7–7.7)
Neutrophils Relative %: 65 %
Platelets: 50 10*3/uL — ABNORMAL LOW (ref 150–400)
RBC: 5 MIL/uL (ref 4.22–5.81)
RDW: 12.6 % (ref 11.5–15.5)
WBC: 3.1 10*3/uL — ABNORMAL LOW (ref 4.0–10.5)
nRBC: 0 % (ref 0.0–0.2)

## 2023-06-27 LAB — BASIC METABOLIC PANEL
Anion gap: 9 (ref 5–15)
BUN: 8 mg/dL (ref 6–20)
CO2: 23 mmol/L (ref 22–32)
Calcium: 8.4 mg/dL — ABNORMAL LOW (ref 8.9–10.3)
Chloride: 103 mmol/L (ref 98–111)
Creatinine, Ser: 0.7 mg/dL (ref 0.61–1.24)
GFR, Estimated: >60 mL/min (ref >60)
Glucose, Bld: 326 mg/dL — ABNORMAL HIGH (ref 70–99)
Potassium: 3.6 mmol/L (ref 3.5–5.1)
Sodium: 135 mmol/L (ref 135–145)

## 2023-06-27 LAB — HEMOGLOBIN A1C
Hgb A1c MFr Bld: 8.9 % — ABNORMAL HIGH (ref 4.8–5.6)
Mean Plasma Glucose: 208.73 mg/dL

## 2023-07-01 NOTE — Anesthesia Preprocedure Evaluation (Signed)
Anesthesia Evaluation  Patient identified by MRN, date of birth, ID band Patient awake    Reviewed: Allergy & Precautions, NPO status , Patient's Chart, lab work & pertinent test results  History of Anesthesia Complications Negative for: history of anesthetic complications  Airway Mallampati: I  TM Distance: >3 FB Neck ROM: Full    Dental  (+) Dental Advisory Given, Caps   Pulmonary former smoker Stop Bang 4   Pulmonary exam normal breath sounds clear to auscultation       Cardiovascular Exercise Tolerance: Good negative cardio ROS Normal cardiovascular exam Rhythm:Regular Rate:Normal      Neuro/Psych  PSYCHIATRIC DISORDERS  Depression    negative neurological ROS     GI/Hepatic negative GI ROS,,,(+) Cirrhosis         Endo/Other  diabetes, Type 2  Morbid obesity  Renal/GU negative Renal ROS     Musculoskeletal  (+) Arthritis ,    Abdominal Normal abdominal exam  (+)   Peds  Hematology  (+) Blood dyscrasia (thrombocytopenia)   Anesthesia Other Findings IMPRESSION: 1. LI-RADS category 3 lesions in the liver. Marked cirrhosis is noted with numerous steatotic nodules. Suggest 3 month follow-up MRI with and without contrast as an initial follow-up interval. 2. Stigmata of portal hypertension with marked splenomegaly. 3. Upper abdominal portosystemic collaterals.   Electronically Signed   By: Donzetta Kohut M.D.   On: 03/22/2021 19:17  Reproductive/Obstetrics negative OB ROS                             Anesthesia Physical Anesthesia Plan  ASA: 3  Anesthesia Plan: General   Post-op Pain Management:    Induction: Intravenous  PONV Risk Score and Plan: Propofol infusion  Airway Management Planned: Nasal Cannula and Natural Airway  Additional Equipment:   Intra-op Plan:   Post-operative Plan:   Informed Consent: I have reviewed the patients History and Physical,  chart, labs and discussed the procedure including the risks, benefits and alternatives for the proposed anesthesia with the patient or authorized representative who has indicated his/her understanding and acceptance.     Dental advisory given  Plan Discussed with: CRNA  Anesthesia Plan Comments:         Anesthesia Quick Evaluation

## 2023-07-02 ENCOUNTER — Telehealth: Payer: Self-pay | Admitting: Orthopedic Surgery

## 2023-07-02 ENCOUNTER — Encounter (HOSPITAL_COMMUNITY): Payer: Self-pay | Admitting: Orthopedic Surgery

## 2023-07-02 ENCOUNTER — Other Ambulatory Visit: Payer: Self-pay

## 2023-07-02 ENCOUNTER — Ambulatory Visit (HOSPITAL_COMMUNITY)
Admission: RE | Admit: 2023-07-02 | Discharge: 2023-07-02 | Disposition: A | Discharge status: Home / Self Care | Payer: HMO | Attending: Orthopedic Surgery | Admitting: Orthopedic Surgery

## 2023-07-02 ENCOUNTER — Encounter (HOSPITAL_COMMUNITY)
Admission: RE | Disposition: A | Discharge status: Home / Self Care | Payer: Self-pay | Source: Home / Self Care | Attending: Orthopedic Surgery

## 2023-07-02 ENCOUNTER — Other Ambulatory Visit: Payer: Self-pay | Admitting: Orthopedic Surgery

## 2023-07-02 ENCOUNTER — Ambulatory Visit (HOSPITAL_COMMUNITY): Payer: Self-pay | Admitting: Certified Registered"

## 2023-07-02 ENCOUNTER — Ambulatory Visit (HOSPITAL_BASED_OUTPATIENT_CLINIC_OR_DEPARTMENT_OTHER): Payer: Self-pay | Admitting: Certified Registered"

## 2023-07-02 DIAGNOSIS — S83242A Other tear of medial meniscus, current injury, left knee, initial encounter: Secondary | ICD-10-CM | POA: Diagnosis not present

## 2023-07-02 DIAGNOSIS — M23322 Other meniscus derangements, posterior horn of medial meniscus, left knee: Secondary | ICD-10-CM

## 2023-07-02 DIAGNOSIS — Z6838 Body mass index (BMI) 38.0-38.9, adult: Secondary | ICD-10-CM | POA: Diagnosis not present

## 2023-07-02 DIAGNOSIS — E119 Type 2 diabetes mellitus without complications: Secondary | ICD-10-CM

## 2023-07-02 DIAGNOSIS — Q6689 Other  specified congenital deformities of feet: Secondary | ICD-10-CM | POA: Diagnosis not present

## 2023-07-02 DIAGNOSIS — Z9889 Other specified postprocedural states: Secondary | ICD-10-CM | POA: Insufficient documentation

## 2023-07-02 DIAGNOSIS — W1842XA Slipping, tripping and stumbling without falling due to stepping into hole or opening, initial encounter: Secondary | ICD-10-CM | POA: Insufficient documentation

## 2023-07-02 DIAGNOSIS — K7460 Unspecified cirrhosis of liver: Secondary | ICD-10-CM | POA: Diagnosis not present

## 2023-07-02 DIAGNOSIS — Z87891 Personal history of nicotine dependence: Secondary | ICD-10-CM | POA: Insufficient documentation

## 2023-07-02 DIAGNOSIS — S83232A Complex tear of medial meniscus, current injury, left knee, initial encounter: Secondary | ICD-10-CM | POA: Insufficient documentation

## 2023-07-02 DIAGNOSIS — M1712 Unilateral primary osteoarthritis, left knee: Secondary | ICD-10-CM | POA: Insufficient documentation

## 2023-07-02 DIAGNOSIS — G8918 Other acute postprocedural pain: Secondary | ICD-10-CM

## 2023-07-02 HISTORY — PX: KNEE ARTHROSCOPY WITH MEDIAL MENISECTOMY: SHX5651

## 2023-07-02 LAB — GLUCOSE, CAPILLARY
Glucose-Capillary: 249 mg/dL — ABNORMAL HIGH (ref 70–99)
Glucose-Capillary: 238 mg/dL — ABNORMAL HIGH (ref 70–99)

## 2023-07-02 SURGERY — ARTHROSCOPY, KNEE, WITH MEDIAL MENISCECTOMY
Anesthesia: General | Site: Knee | Laterality: Left

## 2023-07-02 MED ORDER — BUPIVACAINE-EPINEPHRINE (PF) 0.5% -1:200000 IJ SOLN
INTRAMUSCULAR | Status: DC | PRN
  Administered 2023-07-02: 30 mL via PERINEURAL

## 2023-07-02 MED ORDER — IBUPROFEN 800 MG PO TABS: 800.0000 mg | ORAL_TABLET | Freq: Three times a day (TID) | ORAL | 1 refills | Status: DC | PRN

## 2023-07-02 MED ORDER — EPINEPHRINE PF 1 MG/ML IJ SOLN
INTRAMUSCULAR | Status: AC
  Filled 2023-07-02: qty 4

## 2023-07-02 MED ORDER — SEVOFLURANE IN SOLN
RESPIRATORY_TRACT | Status: AC
  Filled 2023-07-02: qty 250

## 2023-07-02 MED ORDER — FENTANYL CITRATE (PF) 100 MCG/2ML IJ SOLN
INTRAMUSCULAR | Status: DC | PRN
  Administered 2023-07-02: 50 ug via INTRAVENOUS
  Administered 2023-07-02 (×2): 25 ug via INTRAVENOUS

## 2023-07-02 MED ORDER — PHENYLEPHRINE 80 MCG/ML (10ML) SYRINGE FOR IV PUSH (FOR BLOOD PRESSURE SUPPORT)
PREFILLED_SYRINGE | INTRAVENOUS | Status: AC
  Filled 2023-07-02: qty 10

## 2023-07-02 MED ORDER — LACTATED RINGERS IV SOLN: INTRAVENOUS | Status: DC

## 2023-07-02 MED ORDER — MIDAZOLAM HCL 5 MG/5ML IJ SOLN
INTRAMUSCULAR | Status: DC | PRN
  Administered 2023-07-02: 2 mg via INTRAVENOUS

## 2023-07-02 MED ORDER — ONDANSETRON HCL 4 MG/2ML IJ SOLN
4.0000 mg | Freq: Once | INTRAMUSCULAR | Status: AC
  Administered 2023-07-02: 4 mg via INTRAVENOUS

## 2023-07-02 MED ORDER — ORAL CARE MOUTH RINSE: 15.0000 mL | Freq: Once | OROMUCOSAL | Status: AC

## 2023-07-02 MED ORDER — OXYCODONE HCL 5 MG/5ML PO SOLN: 5.0000 mg | Freq: Once | ORAL | Status: AC | PRN

## 2023-07-02 MED ORDER — HYDROCODONE-ACETAMINOPHEN 5-325 MG PO TABS: 1.0000 | ORAL_TABLET | ORAL | Status: DC | PRN

## 2023-07-02 MED ORDER — BUPIVACAINE-EPINEPHRINE (PF) 0.5% -1:200000 IJ SOLN
INTRAMUSCULAR | Status: AC
  Filled 2023-07-02: qty 30

## 2023-07-02 MED ORDER — PROPOFOL 10 MG/ML IV BOLUS
INTRAVENOUS | Status: AC
  Filled 2023-07-02: qty 20

## 2023-07-02 MED ORDER — HYDROCODONE-ACETAMINOPHEN 10-325 MG PO TABS: 1.0000 | ORAL_TABLET | ORAL | 0 refills | Status: DC | PRN

## 2023-07-02 MED ORDER — PROPOFOL 10 MG/ML IV BOLUS
INTRAVENOUS | Status: DC | PRN
  Administered 2023-07-02: 50 mg via INTRAVENOUS
  Administered 2023-07-02: 200 mg via INTRAVENOUS

## 2023-07-02 MED ORDER — SODIUM CHLORIDE 0.9 % IR SOLN
Status: DC | PRN
  Administered 2023-07-02 (×3): 3000 mL

## 2023-07-02 MED ORDER — FENTANYL CITRATE PF 50 MCG/ML IJ SOSY
25.0000 ug | PREFILLED_SYRINGE | INTRAMUSCULAR | Status: DC | PRN
  Administered 2023-07-02: 50 ug via INTRAVENOUS
  Filled 2023-07-02: qty 1

## 2023-07-02 MED ORDER — OXYCODONE HCL 5 MG PO TABS
5.0000 mg | ORAL_TABLET | Freq: Once | ORAL | Status: AC | PRN
  Administered 2023-07-02: 5 mg via ORAL
  Filled 2023-07-02: qty 1

## 2023-07-02 MED ORDER — ONDANSETRON HCL 4 MG/2ML IJ SOLN
4.0000 mg | Freq: Once | INTRAMUSCULAR | Status: DC | PRN
  Filled 2023-07-02: qty 2

## 2023-07-02 MED ORDER — GLYCOPYRROLATE PF 0.2 MG/ML IJ SOSY
PREFILLED_SYRINGE | INTRAMUSCULAR | Status: DC | PRN
  Administered 2023-07-02: .2 mg via INTRAVENOUS

## 2023-07-02 MED ORDER — CEFAZOLIN SODIUM-DEXTROSE 2-4 GM/100ML-% IV SOLN
INTRAVENOUS | Status: AC
  Filled 2023-07-02: qty 100

## 2023-07-02 MED ORDER — CHLORHEXIDINE GLUCONATE 0.12 % MT SOLN: 15.0000 mL | Freq: Once | OROMUCOSAL | Status: AC

## 2023-07-02 MED ORDER — KETOROLAC TROMETHAMINE 30 MG/ML IJ SOLN
INTRAMUSCULAR | Status: AC
  Filled 2023-07-02: qty 1

## 2023-07-02 MED ORDER — DEXAMETHASONE SODIUM PHOSPHATE 10 MG/ML IJ SOLN
INTRAMUSCULAR | Status: AC
  Filled 2023-07-02: qty 1

## 2023-07-02 MED ORDER — CHLORHEXIDINE GLUCONATE 0.12 % MT SOLN
OROMUCOSAL | Status: AC
  Administered 2023-07-02: 15 mL via OROMUCOSAL
  Filled 2023-07-02: qty 15

## 2023-07-02 MED ORDER — LIDOCAINE 2% (20 MG/ML) 5 ML SYRINGE
INTRAMUSCULAR | Status: DC | PRN
  Administered 2023-07-02: 100 mg via INTRAVENOUS

## 2023-07-02 MED ORDER — TRAMADOL HCL 50 MG PO TABS
100.0000 mg | ORAL_TABLET | Freq: Four times a day (QID) | ORAL | 0 refills | Status: AC | PRN
Start: 2023-07-02 — End: 2023-07-07

## 2023-07-02 MED ORDER — GLYCOPYRROLATE PF 0.2 MG/ML IJ SOSY
PREFILLED_SYRINGE | INTRAMUSCULAR | Status: AC
  Filled 2023-07-02: qty 1

## 2023-07-02 MED ORDER — MIDAZOLAM HCL 2 MG/2ML IJ SOLN
INTRAMUSCULAR | Status: AC
  Filled 2023-07-02: qty 2

## 2023-07-02 MED ORDER — DEXAMETHASONE SODIUM PHOSPHATE 4 MG/ML IJ SOLN
INTRAMUSCULAR | Status: DC | PRN
  Administered 2023-07-02: 5 mg via INTRAVENOUS

## 2023-07-02 MED ORDER — DEXTROSE 5 % IV SOLN
INTRAVENOUS | Status: DC | PRN
  Administered 2023-07-02: 3 g via INTRAVENOUS

## 2023-07-02 MED ORDER — PHENYLEPHRINE 80 MCG/ML (10ML) SYRINGE FOR IV PUSH (FOR BLOOD PRESSURE SUPPORT)
PREFILLED_SYRINGE | INTRAVENOUS | Status: DC | PRN
Start: 2023-07-02 — End: 2023-07-02
  Administered 2023-07-02 (×2): 160 ug via INTRAVENOUS

## 2023-07-02 MED ORDER — DEXMEDETOMIDINE HCL IN NACL 80 MCG/20ML IV SOLN
INTRAVENOUS | Status: AC
  Filled 2023-07-02: qty 20

## 2023-07-02 MED ORDER — LIDOCAINE HCL (PF) 2 % IJ SOLN
INTRAMUSCULAR | Status: AC
  Filled 2023-07-02: qty 5

## 2023-07-02 MED ORDER — CEFAZOLIN IN SODIUM CHLORIDE 3-0.9 GM/100ML-% IV SOLN
3.0000 g | INTRAVENOUS | Status: DC
  Filled 2023-07-02: qty 100

## 2023-07-02 MED ORDER — ONDANSETRON HCL 4 MG/2ML IJ SOLN
INTRAMUSCULAR | Status: AC
  Filled 2023-07-02: qty 2

## 2023-07-02 MED ORDER — DEXMEDETOMIDINE HCL IN NACL 80 MCG/20ML IV SOLN
INTRAVENOUS | Status: DC | PRN
Start: 2023-07-02 — End: 2023-07-02
  Administered 2023-07-02 (×2): 8 ug via INTRAVENOUS

## 2023-07-02 MED ORDER — KETOROLAC TROMETHAMINE 30 MG/ML IJ SOLN
INTRAMUSCULAR | Status: DC | PRN
Start: 2023-07-02 — End: 2023-07-02
  Administered 2023-07-02: 30 mg via INTRAVENOUS

## 2023-07-02 MED ORDER — FENTANYL CITRATE (PF) 100 MCG/2ML IJ SOLN
INTRAMUSCULAR | Status: AC
  Filled 2023-07-02: qty 2

## 2023-07-02 MED ORDER — ONDANSETRON HCL 4 MG/2ML IJ SOLN
INTRAMUSCULAR | Status: DC | PRN
  Administered 2023-07-02: 4 mg via INTRAVENOUS

## 2023-07-02 SURGICAL SUPPLY — 45 items
ABLATOR ASPIRATE 50D MULTI-PRT (SURGICAL WAND) IMPLANT
APL PRP STRL LF DISP 70% ISPRP (MISCELLANEOUS) ×1
BAG HAMPER (MISCELLANEOUS) ×1 IMPLANT
BANDAGE ESMARK 6X9 LF (GAUZE/BANDAGES/DRESSINGS) ×1 IMPLANT
BLADE SHAVER TORPEDO 4X13 (MISCELLANEOUS) IMPLANT
BLADE SURG SZ11 CARB STEEL (BLADE) ×1 IMPLANT
BNDG CMPR 9X6 STRL LF SNTH (GAUZE/BANDAGES/DRESSINGS) ×1
BNDG CMPR STD VLCR NS LF 5.8X6 (GAUZE/BANDAGES/DRESSINGS) ×1
BNDG ELASTIC 6X5.8 VLCR NS LF (GAUZE/BANDAGES/DRESSINGS) ×1 IMPLANT
BNDG ESMARK 6X9 LF (GAUZE/BANDAGES/DRESSINGS) ×1
CHLORAPREP W/TINT 26 (MISCELLANEOUS) ×1 IMPLANT
CLOTH BEACON ORANGE TIMEOUT ST (SAFETY) ×1 IMPLANT
COOLER ICEMAN CLASSIC (MISCELLANEOUS) ×1 IMPLANT
COVER LIGHT HANDLE STERIS (MISCELLANEOUS) ×2 IMPLANT
DECANTER SPIKE VIAL GLASS SM (MISCELLANEOUS) ×2 IMPLANT
DRAPE HALF SHEET 40X57 (DRAPES) ×1 IMPLANT
GAUZE SPONGE 4X4 12PLY STRL (GAUZE/BANDAGES/DRESSINGS) ×1 IMPLANT
GAUZE XEROFORM 5X9 LF (GAUZE/BANDAGES/DRESSINGS) ×1 IMPLANT
GLOVE BIOGEL PI IND STRL 7.0 (GLOVE) ×2 IMPLANT
GLOVE SS N UNI LF 8.5 STRL (GLOVE) ×1 IMPLANT
GLOVE SURG POLYISO LF SZ8 (GLOVE) ×1 IMPLANT
GOWN STRL REUS W/TWL LRG LVL3 (GOWN DISPOSABLE) ×1 IMPLANT
GOWN STRL REUS W/TWL XL LVL3 (GOWN DISPOSABLE) ×1 IMPLANT
IV NS IRRIG 3000ML ARTHROMATIC (IV SOLUTION) ×2 IMPLANT
KIT BLADEGUARD II DBL (SET/KITS/TRAYS/PACK) ×1 IMPLANT
KIT TURNOVER CYSTO (KITS) ×1 IMPLANT
MANIFOLD NEPTUNE II (INSTRUMENTS) ×1 IMPLANT
MARKER SKIN DUAL TIP RULER LAB (MISCELLANEOUS) ×1 IMPLANT
NDL HYPO 18GX1.5 BLUNT FILL (NEEDLE) ×1 IMPLANT
NDL HYPO 21X1.5 SAFETY (NEEDLE) ×1 IMPLANT
NDL SPNL 18GX3.5 QUINCKE PK (NEEDLE) ×1 IMPLANT
NEEDLE HYPO 18GX1.5 BLUNT FILL (NEEDLE) ×1
NEEDLE HYPO 21X1.5 SAFETY (NEEDLE) ×1
NEEDLE SPNL 18GX3.5 QUINCKE PK (NEEDLE) ×1
PACK ARTHROSCOPY WL (CUSTOM PROCEDURE TRAY) ×1 IMPLANT
PAD ABD 5X9 TENDERSORB (GAUZE/BANDAGES/DRESSINGS) ×1 IMPLANT
PAD ARMBOARD 7.5X6 YLW CONV (MISCELLANEOUS) ×1 IMPLANT
PAD COLD SHLDR SM WRAP-ON (PAD) IMPLANT
PADDING CAST COTTON 6X4 STRL (CAST SUPPLIES) ×1 IMPLANT
SET ARTHROSCOPY INST (INSTRUMENTS) ×1 IMPLANT
SET BASIN LINEN APH (SET/KITS/TRAYS/PACK) ×1 IMPLANT
SUT ETHILON 3 0 FSL (SUTURE) ×1 IMPLANT
SYR 10ML LL (SYRINGE) ×1 IMPLANT
SYR 30ML LL (SYRINGE) ×2 IMPLANT
TUBING IN/OUT FLOW W/MAIN PUMP (TUBING) ×1 IMPLANT

## 2023-07-02 NOTE — Anesthesia Procedure Notes (Addendum)
Procedure Name: LMA Insertion Date/Time: 07/02/2023 7:42 AM  Performed by: Julian Reil, CRNAPre-anesthesia Checklist: Patient identified, Emergency Drugs available, Suction available and Patient being monitored Patient Re-evaluated:Patient Re-evaluated prior to induction Oxygen Delivery Method: Circle system utilized Preoxygenation: Pre-oxygenation with 100% oxygen Induction Type: IV induction Ventilation: Mask ventilation without difficulty LMA: LMA with gastric port inserted LMA Size: 5.0 Tube type: Oral Number of attempts: 1 Placement Confirmation: positive ETCO2 Tube secured with: Tape Dental Injury: Teeth and Oropharynx as per pre-operative assessment

## 2023-07-02 NOTE — Progress Notes (Signed)
Meds ordered this encounter  Medications   traMADol (ULTRAM) 50 MG tablet    Sig: Take 2 tablets (100 mg total) by mouth every 6 (six) hours as needed for up to 5 days.    Dispense:  40 tablet    Refill:  0

## 2023-07-02 NOTE — Telephone Encounter (Signed)
I called told him he does that sometimes hydrocodone for severe pain and tramadol for  less severe pain but if he can't get both, just give the Hydrocodone.

## 2023-07-02 NOTE — Telephone Encounter (Signed)
Dr. Mort Sawyers pt - Clifton Custard w/[facility] [phone] lvm stating he got a script for this patient today for Hydrocodone 5-325 and Tramadol 50mg  and usually it's one or the other, not both.  He would like to know which one to fill.

## 2023-07-02 NOTE — Op Note (Addendum)
07/02/2023  8:35 AM   07/02/2023  8:34 AM  PATIENT:  [name] [name]  57 y.o. 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   Delay start of Pharmacological VTE agent (>24hrs) due to surgical blood loss or risk of bleeding: not applicable    The patient was identified in the preoperative holding area using 2 approved identification mechanisms. The chart was reviewed and updated. The surgical site was confirmed as left knee and marked with an indelible marker.  The patient was taken to the operating room for anesthesia. After successful general anesthesia, 3 g Ancef was used as IV antibiotics.  The patient was placed in the supine position with the (left) the operative extremity in an arthroscopic leg holder and the opposite extremity in a padded leg holder.  The timeout was executed.  A lateral portal was established with an 11 blade and the scope was introduced into the joint. A diagnostic arthroscopy was performed in circumferential manner examining the  entire knee joint. A medial portal was established and the diagnostic arthroscopy was repeated using a probe to palpate intra-articular structures as they were encountered.   Access to the medial compartment was blocked by an osteophyte in the anteromedial aspect of the knee just anterior to the medial tibial spine  This was handled by switching portals to gain better access and releasing the inner surface of the medial collateral ligament   The medial meniscus was resected using a duckbill forceps. The meniscal fragments were removed with a motorized shaver. The meniscus was balanced with a combination of a motorized shaver and a 50 ArthroCare wand until a stable rim was obtained.   The arthroscopic pump was placed on the wash mode and any excess debris was removed from the joint using suction.  60 cc of Marcaine with epinephrine was injected through the arthroscope.  The portals were closed with 3-0 nylon suture.  A sterile bandage, Ace wrap and Cryo/Cuff was placed and the Cryo/Cuff was activated. The patient was taken to the recovery room in stable condition.  POST OP PLAN  WB as tolerated SUTURES OUT IN A WEEK  AROM  BRACE not needed

## 2023-07-02 NOTE — H&P (Signed)
Preop evaluation   57 year old male presents for evaluation of left knee for possible surgery with a complaint of left knee pain   History he is 57 years old he has a right clubfoot status post multiple surgeries she complains of left knee pain after stepping in a hole back in March   Dr. Hilda Lias has been seeing him eventually got an MRI which showed a torn medial meniscus and arthritis of all 3 compartments   Over the last 3 weeks the pain is decreased but the swelling has persisted and is difficult for him to get out of a seated position   Prior to the injury had no history of knee pain left side   Review of systems is negative       Past Medical History:  Diagnosis Date   Arthritis     Cirrhosis (HCC)     Depression     Diabetes (HCC)     Umbilical hernia               Past Surgical History:  Procedure Laterality Date   BIOPSY   03/27/2021    Procedure: BIOPSY;  Surgeon: Lanelle Bal, DO;  [facility];  Service: Endoscopy;;  gastric   COLONOSCOPY N/A 05/14/2016    Procedure: COLONOSCOPY;  Surgeon: West Bali, MD;  [facility]: [facility];  Service: Endoscopy;  Laterality: N/A;  1000 - moved to 1:30 - office to notify pt   COLONOSCOPY WITH PROPOFOL N/A 03/27/2021    Procedure: COLONOSCOPY WITH PROPOFOL;  Surgeon: Lanelle Bal, DO;  [facility]: [facility];  Service: Endoscopy;  Laterality: N/A;  1:30pm   ESOPHAGOGASTRODUODENOSCOPY N/A 11/01/2015    Procedure: ESOPHAGOGASTRODUODENOSCOPY (EGD);  Surgeon: West Bali, MD;  [facility]: [facility];  Service: Endoscopy;  Laterality: N/A;  245 - moved to 2/1 - office to notify     ESOPHAGOGASTRODUODENOSCOPY (EGD) WITH PROPOFOL N/A 03/27/2021    Procedure: ESOPHAGOGASTRODUODENOSCOPY (EGD) WITH PROPOFOL;  Surgeon: Lanelle Bal, DO;  [facility]: [facility];  Service: Endoscopy;  Laterality: N/A;   FOOT SURGERY Right      club foot repair   HAND SURGERY Right      tendon repair   KNEE SURGERY Right      POLYPECTOMY   03/27/2021    Procedure: POLYPECTOMY;  Surgeon: Lanelle Bal, DO;  [facility]: [facility];  Service: Endoscopy;;             Family History  Problem Relation Age of Onset   Colon cancer Neg Hx     Colon polyps Neg Hx     Stomach cancer Neg Hx     Pancreatic cancer Neg Hx          Social History  Social History         Tobacco Use   Smoking status: Former      Current packs/day: 0.00      Average packs/day: 0.3 packs/day for 10.0 years (2.5 ttl pk-yrs)      Types: Cigarettes      Start date: 02/26/1989      Quit date: 02/27/1999      Years since quitting: 24.2      Passive exposure: Past   Smokeless tobacco: Never  Vaping Use   Vaping status: Never Used  Substance Use Topics   Alcohol use: No   Drug use: No      Allergies  No Known Allergies     Physical Exam  Vitals and nursing note reviewed.  Constitutional:      General: He is not in acute distress.    Appearance: Normal appearance. He is not ill-appearing, toxic-appearing or diaphoretic.  HENT:     Head: Normocephalic and atraumatic.     Nose: Nose normal. No congestion or rhinorrhea.  Eyes:     General: No scleral icterus.       Right eye: No discharge.        Left eye: No discharge.     Extraocular Movements: Extraocular movements intact.     Conjunctiva/sclera: Conjunctivae normal.     Pupils: Pupils are equal, round, and reactive to light.  Cardiovascular:     Pulses: Normal pulses.  Pulmonary:     Effort: Pulmonary effort is normal.     Breath sounds: No wheezing.  Musculoskeletal:     Left knee: Effusion present.  Skin:    General: Skin is warm and dry.     Capillary Refill: Capillary refill takes less than 2 seconds.     Coloration: Skin is not jaundiced.     Findings: No erythema.  Neurological:     General: No focal deficit present.     Mental Status: He is alert and oriented to person, place, and time.  Psychiatric:        Mood and Affect: Mood normal.         Behavior: Behavior normal.        Thought Content: Thought content normal.        Judgment: Judgment normal.      Left Knee Exam    Muscle Strength  The patient has normal left knee strength.   Tenderness  The patient is experiencing tenderness in the medial joint line.   Range of Motion  Extension:  normal  Flexion:  normal    Tests  Varus: negative Valgus: negative Drawer:  Anterior - negative     Posterior - negative   Other  Erythema: absent Scars: absent Sensation: normal Pulse: present Effusion: effusion present           Radiography   Image #1 knee medial joint space narrowing grade 1 arthritis   Image #2 MRI torn medial meniscus complex tear posterior horn with osteoarthritis medial compartment, lateral compartment, patellofemoral joint       Encounter Diagnoses  Name Primary?   Primary osteoarthritis of left knee Yes   Derangement of posterior horn of medial meniscus of left knee        Recommend arthroscopic surgery left knee medial meniscectomy   The procedure has been fully reviewed with the patient; The risks and benefits of surgery have been discussed and explained and understood. Alternative treatment has also been reviewed, questions were encouraged and answered. The postoperative plan is also been reviewed.   He understands that his arthritis may cause him persistent discomfort   Expect 4 to 6-week recovery

## 2023-07-02 NOTE — Discharge Instructions (Signed)
Weightbearing as tolerated!

## 2023-07-02 NOTE — Transfer of Care (Signed)
Immediate Anesthesia Transfer of Care Note  Patient: [name] [name]  Procedure(s) Performed: KNEE ARTHROSCOPY WITH MEDIAL MENISCECTOMY (Left: Knee)  Patient Location: PACU  Anesthesia Type:General  Level of Consciousness: drowsy  Airway & Oxygen Therapy: Patient Spontanous Breathing and Patient connected to face mask oxygen  Post-op Assessment: Report given to RN and Post -op Vital signs reviewed and stable  Post vital signs: Reviewed and stable  Last Vitals:  Vitals Value Taken Time  BP 122/62   Temp 97.8   Pulse 85 07/02/23 0841  Resp 14 07/02/23 0841  SpO2 98 % 07/02/23 0841  Vitals shown include unfiled device data.  Last Pain:  Vitals:   07/02/23 0648  TempSrc: Oral  PainSc: 0-No pain      Patients Stated Pain Goal: 5 (07/02/23 1308)  Complications: No notable events documented.

## 2023-07-02 NOTE — Interval H&P Note (Signed)
History and Physical Interval Note:  07/02/2023 7:19 AM  [name] [name]  has presented today for surgery, with the diagnosis of left knee medial meniscus tear.  The various methods of treatment have been discussed with the patient and family. After consideration of risks, benefits and other options for treatment, the patient has consented to  Procedure(s): KNEE ARTHROSCOPY WITH MEDIAL MENISCECTOMY (Left) as a surgical intervention.  The patient's history has been reviewed, patient examined, no change in status, stable for surgery.  I have reviewed the patient's chart and labs.  Questions were answered to the patient's satisfaction.     Fuller Canada

## 2023-07-02 NOTE — Anesthesia Postprocedure Evaluation (Signed)
Anesthesia Post Note  Patient: [name] [name]  Procedure(s) Performed: KNEE ARTHROSCOPY WITH MEDIAL MENISCECTOMY (Left: Knee)  Patient location during evaluation: PACU Anesthesia Type: General Level of consciousness: awake and alert Pain management: pain level controlled Vital Signs Assessment: post-procedure vital signs reviewed and stable Respiratory status: spontaneous breathing, nonlabored ventilation, respiratory function stable and patient connected to nasal cannula oxygen Cardiovascular status: blood pressure returned to baseline and stable Postop Assessment: no apparent nausea or vomiting Anesthetic complications: no   There were no known notable events for this encounter.   Last Vitals:  Vitals:   07/02/23 0915 07/02/23 0925  BP: 119/70 117/71  Pulse: 71 69  Resp: 16 16  Temp:  (!) 36.2 C  SpO2: 98% 98%    Last Pain:  Vitals:   07/02/23 0925  TempSrc: Oral  PainSc: 4                  Cord Wilczynski L Abubakar Crispo

## 2023-07-02 NOTE — Brief Op Note (Signed)
07/02/2023  8:34 AM  PATIENT:  [name] [name]  57 y.o. male  PRE-OPERATIVE DIAGNOSIS:  left knee medial meniscus tear  POST-OPERATIVE DIAGNOSIS:  left knee medial meniscus tear  PROCEDURE:  Procedure(s): KNEE ARTHROSCOPY WITH MEDIAL MENISCECTOMY (Left)  SURGEON:  Surgeons and Role:    Vickki Hearing, MD - Primary  PHYSICIAN ASSISTANT:   ASSISTANTS: none   ANESTHESIA:   general  EBL:  2 mL   BLOOD ADMINISTERED:none  DRAINS: none   LOCAL MEDICATIONS USED:  MARCAINE     SPECIMEN:  No Specimen  DISPOSITION OF SPECIMEN:  N/A  COUNTS:  YES  TOURNIQUET:  * No tourniquets in log *  DICTATION: .Dragon Dictation  PLAN OF CARE: Discharge to home after PACU  PATIENT DISPOSITION:  PACU - hemodynamically stable.   Delay start of Pharmacological VTE agent (>24hrs) due to surgical blood loss or risk of bleeding: not applicable

## 2023-07-02 NOTE — Addendum Note (Signed)
Addendum  created 07/02/23 1456 by Ronelle Nigh, MD   Clinical Note Signed

## 2023-07-08 ENCOUNTER — Encounter (HOSPITAL_COMMUNITY): Payer: Self-pay | Admitting: Orthopedic Surgery

## 2023-07-08 DIAGNOSIS — Z9889 Other specified postprocedural states: Secondary | ICD-10-CM | POA: Insufficient documentation

## 2023-07-09 ENCOUNTER — Encounter: Payer: Self-pay | Admitting: *Deleted

## 2023-07-10 ENCOUNTER — Ambulatory Visit (INDEPENDENT_AMBULATORY_CARE_PROVIDER_SITE_OTHER): Payer: HMO | Admitting: Orthopedic Surgery

## 2023-07-10 DIAGNOSIS — Z9889 Other specified postprocedural states: Secondary | ICD-10-CM

## 2023-07-10 NOTE — Progress Notes (Signed)
Postop appointment postop visit #1  Encounter Diagnosis  Name Primary?   S/P left knee arthroscopy 07/02/23 Yes    Chief Complaint  Patient presents with   Follow-up    Recheck on left knee, DOS 07-02-23.    Medial knee pain.  This came from surgery he has a lot of bruising there the MCL popped a little bit  Range of motion is 0-80 maybe a little less  Sutures were removed no signs of infection  Assessment and plan status post left knee arthroscopy medial meniscectomy  More aggressive with range of motion Continue ice 3-4 times a day Hinged knee brace as needed Return in 3 weeks Home exercise program

## 2023-07-16 ENCOUNTER — Encounter: Payer: Self-pay | Admitting: *Deleted

## 2023-07-17 ENCOUNTER — Encounter: Payer: Self-pay | Admitting: *Deleted

## 2023-07-31 ENCOUNTER — Ambulatory Visit: Payer: HMO | Admitting: Orthopedic Surgery

## 2023-07-31 DIAGNOSIS — Z9889 Other specified postprocedural states: Secondary | ICD-10-CM

## 2023-07-31 NOTE — Progress Notes (Signed)
Postop visit #2/postop day number29/week #4  Chief Complaint  Patient presents with   Routine Post Op    R KA DOS 07/02/23 Has swelling and sensitivity at incision sites.     PROCEDURE:  Procedure(s): KNEE ARTHROSCOPY WITH MEDIAL MENISCECTOMY (Left)   He is progressing really well he cannot bend his knee well past 90 degrees he has some soreness over the portal sites and some residual swelling  He is doing his exercises icing once every 3 days  Everything looks good in the knee other than the effusion he does have some medial pain where we released the medial collateral ligament to help open up the joint that is getting better  Recommend continue ice 3 times a day if he can, the home exercise program for quad strengthening and he can start some walking    Follow-up in 4 weeks   Operative findings FINDINGS:    Medial compartment posterior horn medial meniscus tear Chondral changes consistent with grade 2 arthritis mainly on the femoral side   ACL PCL intact   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

## 2023-07-31 NOTE — Patient Instructions (Addendum)
Ice the knee more frequently/ use machine daily several times a day   Take advil or aleve once a day   Modify your activity increase slowly as tolerated

## 2023-09-01 ENCOUNTER — Ambulatory Visit (INDEPENDENT_AMBULATORY_CARE_PROVIDER_SITE_OTHER): Payer: HMO | Admitting: Orthopedic Surgery

## 2023-09-01 DIAGNOSIS — Z9889 Other specified postprocedural states: Secondary | ICD-10-CM

## 2023-09-01 NOTE — Progress Notes (Signed)
   Office Visit Note   Patient: [name] [name]           Date of Birth: 29-Nov-1965           MRN: [id number] Visit Date: 09/01/2023 Requested by: Kara Pacer, NP [address] [place],  [place] [address] PCP: Kara Pacer, NP   Assessment & Plan:   Encounter Diagnosis  Name Primary?   S/P left knee arthroscopy 07/02/23 Yes    No orders of the defined types were placed in this encounter.   57 year old male persistent knee swelling after arthroscopy  In reviewing his MRI it shows that he has 3 compartment degenerative changes with chondral damage  His knee is has improved significantly and he can bend it and straighten it well   He still has a mild effusion in the knee He is bending his knee well and is walking unsupported he is happy with his knee he would like a recheck in 2 months which is fine  Recommend ice as needed and ibuprofen as needed   Subjective: Chief Complaint  Patient presents with   Follow-up    Recheck on left knee, DOS 07/02/23.

## 2023-09-02 ENCOUNTER — Other Ambulatory Visit: Payer: Self-pay | Admitting: Orthopedic Surgery

## 2023-09-03 NOTE — Patient Instructions (Signed)
FALLON SALAIZ  09/03/2023     @PREFPERIOPPHARMACY @   Your procedure is scheduled on 09/09/2023.   Report to [facility] at  [phone]  A.M.   Call this number if you have problems the morning of surgery:  [phone]  If you experience any cold or flu symptoms such as cough, fever, chills, shortness of breath, etc. between now and your scheduled surgery, please notify us at the above number.   Remember:       Your last dose of ozempic should have been on 09/01/2023.      Your last dose of jardiance should be on 09/05/2023.       DO NOT take any medications for diabetes the morning of your procedure.   Follow the diet instructions given to you by the office.    You may drink clear liquids until 0600 am on 09/09/2023.        Clear liquids allowed are:                    Water, Juice (No red color; non-citric and without pulp; diabetics please choose diet or no sugar options), Carbonated beverages (diabetics please choose diet or no sugar options), Clear Tea (No creamer, milk, or cream, including half & half and powdered creamer), Black Coffee Only (No creamer, milk or cream, including half & half and powdered creamer), and Clear Sports drink (No red color; diabetics please choose diet or no sugar options)    Take these medicines the morning of surgery with A SIP OF WATER                                                   None.    Do not wear jewelry, make-up or nail polish, including gel polish,  artificial nails, or any other type of covering on natural nails (fingers and  toes).  Do not wear lotions, powders, or perfumes, or deodorant.  Do not shave 48 hours prior to surgery.  Men may shave face and neck.  Do not bring valuables to the hospital.  [facility] is not responsible for any belongings or valuables.  Contacts, dentures or bridgework may not be worn into surgery.  Leave your suitcase in the car.  After surgery it may be brought to your room.  For patients  admitted to the hospital, discharge time will be determined by your treatment team.  Patients discharged the day of surgery will not be allowed to drive home and must have someone with them for 24 hours.    Special instructions:   DO NOT smoke tobacco or vape for 24 hours before your procedure.  Please read over the following fact sheets that you were given. Anesthesia Post-op Instructions and Care and Recovery After Surgery      Upper Endoscopy, Adult, Care After After the procedure, it is common to have a sore throat. It is also common to have: Mild stomach pain or discomfort. Bloating. Nausea. Follow these instructions at home: The instructions below may help you care for yourself at home. Your health care provider may give you more instructions. If you have questions, ask your health care provider. If you were given a sedative during the procedure, it can affect you for several hours. Do not drive or operate machinery until your health  care provider says that it is safe. If you will be going home right after the procedure, plan to have a responsible adult: Take you home from the hospital or clinic. You will not be allowed to drive. Care for you for the time you are told. Follow instructions from your health care provider about what you may eat and drink. Return to your normal activities as told by your health care provider. Ask your health care provider what activities are safe for you. Take over-the-counter and prescription medicines only as told by your health care provider. Contact a health care provider if you: Have a sore throat that lasts longer than one day. Have trouble swallowing. Have a fever. Get help right away if you: Vomit blood or your vomit looks like coffee grounds. Have bloody, black, or tarry stools. Have a very bad sore throat or you cannot swallow. Have difficulty breathing or very bad pain in your chest or abdomen. These symptoms may be an emergency. Get  help right away. Call 911. Do not wait to see if the symptoms will go away. Do not drive yourself to the hospital. Summary After the procedure, it is common to have a sore throat, mild stomach discomfort, bloating, and nausea. If you were given a sedative during the procedure, it can affect you for several hours. Do not drive until your health care provider says that it is safe. Follow instructions from your health care provider about what you may eat and drink. Return to your normal activities as told by your health care provider. This information is not intended to replace advice given to you by your health care provider. Make sure you discuss any questions you have with your health care provider. Document Revised: 12/26/2021 Document Reviewed: 12/26/2021 Elsevier Patient Education  2024 [employer] Monitored Anesthesia Care, Care After The following information offers guidance on how to care for yourself after your procedure. Your health care provider may also give you more specific instructions. If you have problems or questions, contact your health care provider. What can I expect after the procedure? After the procedure, it is common to have: Tiredness. Little or no memory about what happened during or after the procedure. Impaired judgment when it comes to making decisions. Nausea or vomiting. Some trouble with balance. Follow these instructions at home: For the time period you were told by your health care provider:  Rest. Do not participate in activities where you could fall or become injured. Do not drive or use machinery. Do not drink alcohol. Do not take sleeping pills or medicines that cause drowsiness. Do not make important decisions or sign legal documents. Do not take care of children on your own. Medicines Take over-the-counter and prescription medicines only as told by your health care provider. If you were prescribed antibiotics, take them as told by your health  care provider. Do not stop using the antibiotic even if you start to feel better. Eating and drinking Follow instructions from your health care provider about what you may eat and drink. Drink enough fluid to keep your urine pale yellow. If you vomit: Drink clear fluids slowly and in small amounts as you are able. Clear fluids include water, ice chips, low-calorie sports drinks, and fruit juice that has water added to it (diluted fruit juice). Eat light and bland foods in small amounts as you are able. These foods include bananas, applesauce, rice, lean meats, toast, and crackers. General instructions  Have a responsible adult stay with you for  the time you are told. It is important to have someone help care for you until you are awake and alert. If you have sleep apnea, surgery and some medicines can increase your risk for breathing problems. Follow instructions from your health care provider about wearing your sleep device: When you are sleeping. This includes during daytime naps. While taking prescription pain medicines, sleeping medicines, or medicines that make you drowsy. Do not use any products that contain nicotine or tobacco. These products include cigarettes, chewing tobacco, and vaping devices, such as e-cigarettes. If you need help quitting, ask your health care provider. Contact a health care provider if: You feel nauseous or vomit every time you eat or drink. You feel light-headed. You are still sleepy or having trouble with balance after 24 hours. You get a rash. You have a fever. You have redness or swelling around the IV site. Get help right away if: You have trouble breathing. You have new confusion after you get home. These symptoms may be an emergency. Get help right away. Call 911. Do not wait to see if the symptoms will go away. Do not drive yourself to the hospital. This information is not intended to replace advice given to you by your health care provider. Make  sure you discuss any questions you have with your health care provider. Document Revised: 02/11/2022 Document Reviewed: 02/11/2022 Elsevier Patient Education  2024 [employer].

## 2023-09-05 ENCOUNTER — Encounter (HOSPITAL_COMMUNITY): Payer: Self-pay

## 2023-09-05 ENCOUNTER — Other Ambulatory Visit: Payer: Self-pay

## 2023-09-05 ENCOUNTER — Encounter (HOSPITAL_COMMUNITY)
Admission: RE | Admit: 2023-09-05 | Discharge: 2023-09-05 | Disposition: A | Discharge status: Home / Self Care | Payer: HMO | Source: Ambulatory Visit | Attending: Internal Medicine

## 2023-09-05 VITALS — BP 135/73 | HR 20 | Temp 97.8°F | Resp 18 | Ht 71.0 in | Wt 276.9 lb

## 2023-09-05 DIAGNOSIS — Z01812 Encounter for preprocedural laboratory examination: Secondary | ICD-10-CM | POA: Diagnosis present

## 2023-09-05 DIAGNOSIS — Z01818 Encounter for other preprocedural examination: Secondary | ICD-10-CM

## 2023-09-05 LAB — COMPREHENSIVE METABOLIC PANEL
ALT: 40 U/L (ref 0–44)
AST: 46 U/L — ABNORMAL HIGH (ref 15–41)
Albumin: 3.6 g/dL (ref 3.5–5.0)
Alkaline Phosphatase: 165 U/L — ABNORMAL HIGH (ref 38–126)
Anion gap: 9 (ref 5–15)
BUN: 9 mg/dL (ref 6–20)
CO2: 23 mmol/L (ref 22–32)
Calcium: 8.8 mg/dL — ABNORMAL LOW (ref 8.9–10.3)
Chloride: 106 mmol/L (ref 98–111)
Creatinine, Ser: 0.66 mg/dL (ref 0.61–1.24)
GFR, Estimated: >60 mL/min (ref >60)
Glucose, Bld: 208 mg/dL — ABNORMAL HIGH (ref 70–99)
Potassium: 3.7 mmol/L (ref 3.5–5.1)
Sodium: 138 mmol/L (ref 135–145)
Total Bilirubin: 1.3 mg/dL — ABNORMAL HIGH (ref <1.2)
Total Protein: 7 g/dL (ref 6.5–8.1)

## 2023-09-05 LAB — CBC
HCT: 43.7 % (ref 39.0–52.0)
Hemoglobin: 15.1 g/dL (ref 13.0–17.0)
MCH: 30.1 pg (ref 26.0–34.0)
MCHC: 34.6 g/dL (ref 30.0–36.0)
MCV: 87.2 fL (ref 80.0–100.0)
Platelets: 45 10*3/uL — ABNORMAL LOW (ref 150–400)
RBC: 5.01 MIL/uL (ref 4.22–5.81)
RDW: 13.2 % (ref 11.5–15.5)
WBC: 2.9 10*3/uL — ABNORMAL LOW (ref 4.0–10.5)
nRBC: 0.7 % — ABNORMAL HIGH (ref 0.0–0.2)

## 2023-09-05 LAB — PROTIME-INR
INR: 1.2 (ref 0.8–1.2)
Prothrombin Time: 14.9 s (ref 11.4–15.2)

## 2023-09-09 ENCOUNTER — Encounter (HOSPITAL_COMMUNITY): Payer: Self-pay

## 2023-09-09 ENCOUNTER — Ambulatory Visit (HOSPITAL_COMMUNITY)
Admission: RE | Admit: 2023-09-09 | Discharge: 2023-09-09 | Disposition: A | Discharge status: Home / Self Care | Payer: HMO | Attending: Internal Medicine | Admitting: Internal Medicine

## 2023-09-09 ENCOUNTER — Other Ambulatory Visit: Payer: Self-pay

## 2023-09-09 ENCOUNTER — Encounter (HOSPITAL_COMMUNITY)
Admission: RE | Disposition: A | Discharge status: Home / Self Care | Payer: Self-pay | Source: Home / Self Care | Attending: Internal Medicine

## 2023-09-09 ENCOUNTER — Ambulatory Visit (HOSPITAL_BASED_OUTPATIENT_CLINIC_OR_DEPARTMENT_OTHER): Payer: HMO | Admitting: Anesthesiology

## 2023-09-09 ENCOUNTER — Ambulatory Visit (HOSPITAL_COMMUNITY): Payer: HMO | Admitting: Anesthesiology

## 2023-09-09 DIAGNOSIS — E119 Type 2 diabetes mellitus without complications: Secondary | ICD-10-CM | POA: Insufficient documentation

## 2023-09-09 DIAGNOSIS — K7460 Unspecified cirrhosis of liver: Secondary | ICD-10-CM

## 2023-09-09 DIAGNOSIS — Z87891 Personal history of nicotine dependence: Secondary | ICD-10-CM | POA: Diagnosis not present

## 2023-09-09 DIAGNOSIS — K449 Diaphragmatic hernia without obstruction or gangrene: Secondary | ICD-10-CM | POA: Insufficient documentation

## 2023-09-09 DIAGNOSIS — Z7985 Long-term (current) use of injectable non-insulin antidiabetic drugs: Secondary | ICD-10-CM | POA: Diagnosis not present

## 2023-09-09 DIAGNOSIS — Z7984 Long term (current) use of oral hypoglycemic drugs: Secondary | ICD-10-CM | POA: Insufficient documentation

## 2023-09-09 DIAGNOSIS — K2970 Gastritis, unspecified, without bleeding: Secondary | ICD-10-CM | POA: Diagnosis not present

## 2023-09-09 DIAGNOSIS — I10 Essential (primary) hypertension: Secondary | ICD-10-CM | POA: Insufficient documentation

## 2023-09-09 HISTORY — PX: ESOPHAGOGASTRODUODENOSCOPY (EGD) WITH PROPOFOL: SHX5813

## 2023-09-09 LAB — GLUCOSE, CAPILLARY: Glucose-Capillary: 193 mg/dL — ABNORMAL HIGH (ref 70–99)

## 2023-09-09 SURGERY — ESOPHAGOGASTRODUODENOSCOPY (EGD) WITH PROPOFOL
Anesthesia: General

## 2023-09-09 MED ORDER — LACTATED RINGERS IV SOLN: INTRAVENOUS | Status: DC

## 2023-09-09 MED ORDER — PROPOFOL 500 MG/50ML IV EMUL
INTRAVENOUS | Status: DC | PRN
  Administered 2023-09-09: 50 mg via INTRAVENOUS
  Administered 2023-09-09: 120 mg via INTRAVENOUS
  Administered 2023-09-09: 30 mg via INTRAVENOUS

## 2023-09-09 MED ORDER — LIDOCAINE HCL (CARDIAC) PF 100 MG/5ML IV SOSY
PREFILLED_SYRINGE | INTRAVENOUS | Status: DC | PRN
  Administered 2023-09-09: 100 mg via INTRAVENOUS

## 2023-09-09 NOTE — Transfer of Care (Signed)
Immediate Anesthesia Transfer of Care Note  Patient: [name] [name]  Procedure(s) Performed: ESOPHAGOGASTRODUODENOSCOPY (EGD) WITH PROPOFOL  Patient Location: Short Stay  Anesthesia Type:General  Level of Consciousness: awake  Airway & Oxygen Therapy: Patient Spontanous Breathing  Post-op Assessment: Report given to RN and Post -op Vital signs reviewed and stable  Post vital signs: Reviewed and stable  Last Vitals:  Vitals Value Taken Time  BP 117/59 09/09/23 0820  Temp 37.1 C 09/09/23 0820  Pulse 90 09/09/23 0820  Resp 18 09/09/23 0820  SpO2 95 % 09/09/23 0820    Last Pain:  Vitals:   09/09/23 0820  TempSrc: Oral  PainSc: 0-No pain      Patients Stated Pain Goal: 7 (09/09/23 0820)  Complications: No notable events documented.

## 2023-09-09 NOTE — Anesthesia Postprocedure Evaluation (Signed)
Anesthesia Post Note  Patient: [name] [name]  Procedure(s) Performed: ESOPHAGOGASTRODUODENOSCOPY (EGD) WITH PROPOFOL  Patient location during evaluation: Phase II Anesthesia Type: General Level of consciousness: awake Pain management: pain level controlled Vital Signs Assessment: post-procedure vital signs reviewed and stable Respiratory status: spontaneous breathing and respiratory function stable Cardiovascular status: blood pressure returned to baseline and stable Postop Assessment: no headache and no apparent nausea or vomiting Anesthetic complications: no Comments: Late entry   No notable events documented.   Last Vitals:  Vitals:   09/09/23 0706 09/09/23 0820  BP: (!) 141/80 (!) 117/59  Pulse: 83 90  Resp: 15 18  Temp: 36.8 C 37.1 C  SpO2: 95% 95%    Last Pain:  Vitals:   09/09/23 0820  TempSrc: Oral  PainSc: 0-No pain                 Windell Norfolk

## 2023-09-09 NOTE — Op Note (Signed)
[facility] Patient Name: [name] [name] Procedure Date: 09/09/2023 8:00 AM MRN: [id number] Date of Birth: 1966-08-14 Attending MD: Hennie Duos. Marletta Lor , [place], [id number] CSN: [id number] Age: 57 Admit Type: Outpatient Procedure:                Upper GI endoscopy Indications:              Cirrhosis rule out esophageal varices Providers:                Hennie Duos. Marletta Lor, DO, Nena Polio, RN, Francoise Ceo                            RN, RN, Dyann Ruddle Referring MD:              Medicines:                See the Anesthesia note for documentation of the                            administered medications Complications:            No immediate complications. Estimated Blood Loss:     Estimated blood loss: none. Procedure:                Pre-Anesthesia Assessment:                           - The anesthesia plan was to use monitored                            anesthesia care (MAC).                           After obtaining informed consent, the endoscope was                            passed under direct vision. Throughout the                            procedure, the patient's blood pressure, pulse, and                            oxygen saturations were monitored continuously. The                            [id number]-H190 ([id number]) scope was introduced through the                            mouth, and advanced to the second part of duodenum.                            The upper GI endoscopy was accomplished without                            difficulty. The patient tolerated the procedure  well. Scope In: 8:11:25 AM Scope Out: 8:15:40 AM Total Procedure Duration: 0 hours 4 minutes 15 seconds  Findings:      There is no endoscopic evidence of varices in the entire esophagus.      A small hiatal hernia was present.      Patchy mild inflammation characterized by erythema was found in the       gastric body.      The duodenal bulb, first portion of the duodenum and  second portion of       the duodenum were normal. Impression:               - Small hiatal hernia.                           - Gastritis.                           - Normal duodenal bulb, first portion of the                            duodenum and second portion of the duodenum.                           - No specimens collected. Moderate Sedation:      Per Anesthesia Care Recommendation:           - Patient has a contact number available for                            emergencies. The signs and symptoms of potential                            delayed complications were discussed with the                            patient. Return to normal activities tomorrow.                            Written discharge instructions were provided to the                            patient.                           - Resume previous diet.                           - Continue present medications.                           - Repeat upper endoscopy in 2 years for screening                            purposes.                           - Return to GI clinic in 3 months. Procedure Code(s):        ---  Professional ---                           [phone], Esophagogastroduodenoscopy, flexible,                            transoral; diagnostic, including collection of                            specimen(s) by brushing or washing, when performed                            (separate procedure) Diagnosis Code(s):        --- Professional ---                           K44.9, Diaphragmatic hernia without obstruction or                            gangrene                           K29.70, Gastritis, unspecified, without bleeding                           K74.60, Unspecified cirrhosis of liver CPT copyright 2022 American Medical Association. All rights reserved. The codes documented in this report are preliminary and upon coder review may  be revised to meet current compliance requirements. Hennie Duos. Marletta Lor,  DO Hennie Duos. Marletta Lor, DO 09/09/2023 8:18:21 AM This report has been signed electronically. Number of Addenda: 0

## 2023-09-09 NOTE — Discharge Instructions (Addendum)
EGD Discharge instructions Please read the instructions outlined below and refer to this sheet in the next few weeks. These discharge instructions provide you with general information on caring for yourself after you leave the hospital. Your doctor may also give you specific instructions. While your treatment has been planned according to the most current medical practices available, unavoidable complications occasionally occur. If you have any problems or questions after discharge, please call your doctor. ACTIVITY You may resume your regular activity but move at a slower pace for the next 24 hours.  Take frequent rest periods for the next 24 hours.  Walking will help expel (get rid of) the air and reduce the bloated feeling in your abdomen.  No driving for 24 hours (because of the anesthesia (medicine) used during the test).  You may shower.  Do not sign any important legal documents or operate any machinery for 24 hours (because of the anesthesia used during the test).  NUTRITION Drink plenty of fluids.  You may resume your normal diet.  Begin with a light meal and progress to your normal diet.  Avoid alcoholic beverages for 24 hours or as instructed by your caregiver.  MEDICATIONS You may resume your normal medications unless your caregiver tells you otherwise.  WHAT YOU CAN EXPECT TODAY You may experience abdominal discomfort such as a feeling of fullness or "gas" pains.  FOLLOW-UP Your doctor will discuss the results of your test with you.  SEEK IMMEDIATE MEDICAL ATTENTION IF ANY OF THE FOLLOWING OCCUR: Excessive nausea (feeling sick to your stomach) and/or vomiting.  Severe abdominal pain and distention (swelling).  Trouble swallowing.  Temperature over 101 F (37.8 C).  Rectal bleeding or vomiting of blood.    I did not see any evidence of esophageal varices on today's examination.  Small hiatal hernia again noted.  Stomach with mild inflammation, normal duodenum.  Repeat upper  endoscopy in 2 years.  Follow-up in 3 to 4 months.    I hope you have a fantastic Christmas season. Continue to work on weight loss.   Hennie Duos. Marletta Lor, D.O. Gastroenterology and Hepatology [facility]

## 2023-09-09 NOTE — Anesthesia Preprocedure Evaluation (Signed)
Anesthesia Evaluation  Patient identified by MRN, date of birth, ID band Patient awake    Reviewed: Allergy & Precautions, H&P , NPO status , Patient's Chart, lab work & pertinent test results, reviewed documented beta blocker date and time   Airway Mallampati: II  TM Distance: >3 FB Neck ROM: full    Dental no notable dental hx.    Pulmonary neg pulmonary ROS, former smoker   Pulmonary exam normal breath sounds clear to auscultation       Cardiovascular Exercise Tolerance: Good hypertension, negative cardio ROS  Rhythm:regular Rate:Normal     Neuro/Psych  PSYCHIATRIC DISORDERS  Depression    negative neurological ROS  negative psych ROS   GI/Hepatic negative GI ROS,,,(+) Cirrhosis         Endo/Other  negative endocrine ROSdiabetes    Renal/GU negative Renal ROS  negative genitourinary   Musculoskeletal   Abdominal   Peds  Hematology negative hematology ROS (+)   Anesthesia Other Findings   Reproductive/Obstetrics negative OB ROS                             Anesthesia Physical Anesthesia Plan  ASA: 3  Anesthesia Plan: General   Post-op Pain Management:    Induction:   PONV Risk Score and Plan: Propofol infusion  Airway Management Planned:   Additional Equipment:   Intra-op Plan:   Post-operative Plan:   Informed Consent: I have reviewed the patients History and Physical, chart, labs and discussed the procedure including the risks, benefits and alternatives for the proposed anesthesia with the patient or authorized representative who has indicated his/her understanding and acceptance.     Dental Advisory Given  Plan Discussed with: CRNA  Anesthesia Plan Comments:        Anesthesia Quick Evaluation

## 2023-09-09 NOTE — H&P (Signed)
Primary Care Physician:  Kara Pacer, NP Primary Gastroenterologist:  Dr. Marletta Lor  Pre-Procedure History & Physical: HPI:  [name] [name] is a 57 y.o. male is here for an EGD to be performed for cirrhosis, variceal screening  Past Medical History:  Diagnosis Date   Arthritis    Cirrhosis (HCC)    Depression    Diabetes (HCC)    Umbilical hernia     Past Surgical History:  Procedure Laterality Date   BIOPSY  03/27/2021   Procedure: BIOPSY;  Surgeon: Lanelle Bal, DO;  Location: [facility];  Service: Endoscopy;;  gastric   COLONOSCOPY N/A 05/14/2016   Procedure: COLONOSCOPY;  Surgeon: West Bali, MD;  Location: [facility];  Service: Endoscopy;  Laterality: N/A;  1000 - moved to 1:30 - office to notify pt   COLONOSCOPY WITH PROPOFOL N/A 03/27/2021   Procedure: COLONOSCOPY WITH PROPOFOL;  Surgeon: Lanelle Bal, DO;  [facility]: [facility];  Service: Endoscopy;  Laterality: N/A;  1:30pm   ESOPHAGOGASTRODUODENOSCOPY N/A 11/01/2015   Procedure: ESOPHAGOGASTRODUODENOSCOPY (EGD);  Surgeon: West Bali, MD;  Location: [facility];  Service: Endoscopy;  Laterality: N/A;  245 - moved to 2/1 - office to notify    ESOPHAGOGASTRODUODENOSCOPY (EGD) WITH PROPOFOL N/A 03/27/2021   Procedure: ESOPHAGOGASTRODUODENOSCOPY (EGD) WITH PROPOFOL;  Surgeon: Lanelle Bal, DO;  Location: [facility];  Service: Endoscopy;  Laterality: N/A;   FOOT SURGERY Right    club foot repair   HAND SURGERY Right    tendon repair   KNEE ARTHROSCOPY WITH MEDIAL MENISECTOMY Left 07/02/2023   Procedure: KNEE ARTHROSCOPY WITH MEDIAL MENISCECTOMY;  Surgeon: Vickki Hearing, MD;  Location: [facility];  Service: Orthopedics;  Laterality: Left;   KNEE SURGERY Right    POLYPECTOMY  03/27/2021   Procedure: POLYPECTOMY;  Surgeon: Lanelle Bal, DO;  Location: [facility];  Service: Endoscopy;;    Prior to Admission medications   Medication Sig Start Date End Date Taking?  Authorizing Provider  atorvastatin (LIPITOR) 10 MG tablet Take 10 mg by mouth every evening.   Yes [provider]  ibuprofen (ADVIL) 800 MG tablet TAKE 1 TABLET BY MOUTH EVERY 8 HOURS AS NEEDED 09/03/23  Yes Vickki Hearing, MD  JARDIANCE 25 MG TABS tablet Take 25 mg by mouth in the morning. 06/04/23  Yes [provider]  metFORMIN (GLUCOPHAGE) 500 MG tablet Take 500 mg by mouth in the morning. 03/03/23  Yes [provider]  OZEMPIC, 2 MG/DOSE, 8 MG/3ML SOPN Inject 2 mg into the skin every Sunday. 02/27/23  Yes [provider]    Allergies as of 07/16/2023   (No Known Allergies)    Family History  Problem Relation Age of Onset   Colon cancer Neg Hx    Colon polyps Neg Hx    Stomach cancer Neg Hx    Pancreatic cancer Neg Hx     Social History   Socioeconomic History   Marital status: Married    Spouse name: Not on file   Number of children: Not on file   Years of education: Not on file   Highest education level: Not on file  Occupational History   Not on file  Tobacco Use   Smoking status: Former    Current packs/day: 0.00    Average packs/day: 0.3 packs/day for 10.0 years (2.5 ttl pk-yrs)    Types: Cigarettes    Start date: 02/26/1989    Quit date: 02/27/1999  Years since quitting: 24.5    Passive exposure: Past   Smokeless tobacco: Never  Vaping Use   Vaping status: Never Used  Substance and Sexual Activity   Alcohol use: No   Drug use: No   Sexual activity: Never  Other Topics Concern   Not on file  Social History Narrative   Not on file   Social Determinants of Health   Financial Resource Strain: Not on file  Food Insecurity: No Food Insecurity (02/03/2023)   Hunger Vital Sign    Worried About Running Out of Food in the Last Year: Never true    Ran Out of Food in the Last Year: Never true  Transportation Needs: No Transportation Needs (02/03/2023)   PRAPARE - Administrator, Civil Service (Medical): No    Lack  of Transportation (Non-Medical): No  Physical Activity: Not on file  Stress: Not on file  Social Connections: Not on file  Intimate Partner Violence: Not At Risk (02/03/2023)   Humiliation, Afraid, Rape, and Kick questionnaire    Fear of Current or Ex-Partner: No    Emotionally Abused: No    Physically Abused: No    Sexually Abused: No    Review of Systems: General: Negative for fever, chills, fatigue, weakness. Eyes: Negative for vision changes.  ENT: Negative for hoarseness, difficulty swallowing , nasal congestion. CV: Negative for chest pain, angina, palpitations, dyspnea on exertion, peripheral edema.  Respiratory: Negative for dyspnea at rest, dyspnea on exertion, cough, sputum, wheezing.  GI: See history of present illness. GU:  Negative for dysuria, hematuria, urinary incontinence, urinary frequency, nocturnal urination.  MS: Negative for joint pain, low back pain.  Derm: Negative for rash or itching.  Neuro: Negative for weakness, abnormal sensation, seizure, frequent headaches, memory loss, confusion.  Psych: Negative for anxiety, depression Endo: Negative for unusual weight change.  Heme: Negative for bruising or bleeding. Allergy: Negative for rash or hives.  Physical Exam: Vital signs in last 24 hours: Temp:  [98.3 F (36.8 C)] 98.3 F (36.8 C) (12/10 0706) Pulse Rate:  [83] 83 (12/10 0706) Resp:  [15] 15 (12/10 0706) BP: (141)/(80) 141/80 (12/10 0706) SpO2:  [95 %] 95 % (12/10 0706) Weight:  [528 kg] 123 kg (12/10 0706)   General:   Alert,  Well-developed, well-nourished, pleasant and cooperative in NAD Head:  Normocephalic and atraumatic. Eyes:  Sclera clear, no icterus.   Conjunctiva pink. Ears:  Normal auditory acuity. Nose:  No deformity, discharge,  or lesions. Msk:  Symmetrical without gross deformities. Normal posture. Extremities:  Without clubbing or edema. Neurologic:  Alert and  oriented x4;  grossly normal neurologically. Skin:  Intact without  significant lesions or rashes. Psych:  Alert and cooperative. Normal mood and affect.   Impression/Plan: [name] [name] is here for an EGD to be performed for cirrhosis, variceal screening  Risks, benefits, limitations, imponderables and alternatives regarding procedure have been reviewed with the patient. Questions have been answered. All parties agreeable.

## 2023-09-17 ENCOUNTER — Encounter (HOSPITAL_COMMUNITY): Payer: Self-pay | Admitting: Internal Medicine

## 2023-09-22 ENCOUNTER — Inpatient Hospital Stay: Payer: HMO | Attending: Hematology

## 2023-09-22 DIAGNOSIS — Z7985 Long-term (current) use of injectable non-insulin antidiabetic drugs: Secondary | ICD-10-CM | POA: Insufficient documentation

## 2023-09-22 DIAGNOSIS — D696 Thrombocytopenia, unspecified: Secondary | ICD-10-CM | POA: Diagnosis present

## 2023-09-22 DIAGNOSIS — Z803 Family history of malignant neoplasm of breast: Secondary | ICD-10-CM | POA: Diagnosis not present

## 2023-09-22 DIAGNOSIS — K7460 Unspecified cirrhosis of liver: Secondary | ICD-10-CM | POA: Insufficient documentation

## 2023-09-22 DIAGNOSIS — Z8379 Family history of other diseases of the digestive system: Secondary | ICD-10-CM | POA: Insufficient documentation

## 2023-09-22 DIAGNOSIS — Z87891 Personal history of nicotine dependence: Secondary | ICD-10-CM | POA: Diagnosis not present

## 2023-09-22 DIAGNOSIS — Z79899 Other long term (current) drug therapy: Secondary | ICD-10-CM | POA: Insufficient documentation

## 2023-09-22 DIAGNOSIS — E119 Type 2 diabetes mellitus without complications: Secondary | ICD-10-CM | POA: Insufficient documentation

## 2023-09-22 DIAGNOSIS — Z808 Family history of malignant neoplasm of other organs or systems: Secondary | ICD-10-CM | POA: Diagnosis not present

## 2023-09-22 LAB — CBC WITH DIFFERENTIAL/PLATELET
Abs Immature Granulocytes: 0.01 10*3/uL (ref 0.00–0.07)
Basophils Absolute: 0 10*3/uL (ref 0.0–0.1)
Basophils Relative: 1 %
Eosinophils Absolute: 0.1 10*3/uL (ref 0.0–0.5)
Eosinophils Relative: 3 %
HCT: 43.4 % (ref 39.0–52.0)
Hemoglobin: 15 g/dL (ref 13.0–17.0)
Immature Granulocytes: 0 %
Lymphocytes Relative: 29 %
Lymphs Abs: 1 10*3/uL (ref 0.7–4.0)
MCH: 30.4 pg (ref 26.0–34.0)
MCHC: 34.6 g/dL (ref 30.0–36.0)
MCV: 87.9 fL (ref 80.0–100.0)
Monocytes Absolute: 0.3 10*3/uL (ref 0.1–1.0)
Monocytes Relative: 8 %
Neutro Abs: 2.1 10*3/uL (ref 1.7–7.7)
Neutrophils Relative %: 59 %
Platelets: 62 10*3/uL — ABNORMAL LOW (ref 150–400)
RBC: 4.94 MIL/uL (ref 4.22–5.81)
RDW: 13.1 % (ref 11.5–15.5)
WBC: 3.5 10*3/uL — ABNORMAL LOW (ref 4.0–10.5)
nRBC: 0 % (ref 0.0–0.2)

## 2023-09-22 LAB — IMMATURE PLATELET FRACTION: Immature Platelet Fraction: 17.6 % — ABNORMAL HIGH (ref 1.2–8.6)

## 2023-09-28 NOTE — Progress Notes (Signed)
[facility] [address] [place] [address]    Clinic Day:  09/29/2023  Referring physician: Pearson Grippe, MD  Patient Care Team: Nsumanganyi, Colleen Can, NP as PCP - General Fields, Darleene Cleaver, MD (Inactive) as Consulting Physician (Gastroenterology)   ASSESSMENT & PLAN:   Assessment: 1.  Severe thrombocytopenia: - Patient seen at the request of Colleen Can, NP - PLT (11/24/2022): 48, 76 (02/2018), 108 (10/2016), 130 (10/2015) - CT abdomen (12/16/2022): Cirrhosis with sequela of portal hypertension, splenomegaly 17.8 cm. - Previous workup for vitamin deficiencies, SPEP, infections were negative. - Thrombocytopenia was thought to be from splenomegaly and splenic sequestration.   2.  Social/family history: - He is seen with his wife Elnita Maxwell.  He works as a Psychologist, occupational.  Non-smoker.  Nonalcoholic. - Mother had nonalcoholic fatty liver disease.  Father had cirrhosis.  Maternal aunt had breast cancer.  Maternal grandmother had breast cancer.  Brother had throat cancer.    Plan: 1.  Severe thrombocytopenia: - He does not report any significant bleeding or bruising after last visit 6 months ago. - He lost about 10 pounds as he is on Ozempic for his diabetes. - He had a EGD on 09/09/2023: No varices.  Gastritis was seen. - He also had left knee surgery without any bleeding issues. - Reviewed CBC from 09/22/2023: Platelet count is 62K.  White count is 3.5 with normal ANC.  Hemoglobin normal.  Immature platelet fraction is elevated at 17.6.  Question if there is any peripheral destruction of platelets. - Recommend follow-up in 1 year with repeat platelet count.    Orders Placed This Encounter  Procedures   CBC with Differential    Standing Status:   Future    Expected Date:   09/28/2024    Expiration Date:   09/28/2024      I,Katie Daubenspeck,acting as a scribe for [name] Massed, MD.,have documented all relevant documentation on the behalf of [name] Massed, MD,as directed by  [name] Massed, MD while in the presence of [name] Massed, MD.   I, [name] Massed MD, have reviewed the above documentation for accuracy and completeness, and I agree with the above.   [name] Massed, MD   12/30/202410:51 AM  CHIEF COMPLAINT:   Diagnosis: severe thrombocytopenia    Cancer Staging  No matching staging information was found for the patient.    Prior Therapy: none  Current Therapy:  observation   HISTORY OF PRESENT ILLNESS:   Oncology History   No history exists.     INTERVAL HISTORY:   [name] [name] is a 57 y.o. male presenting to clinic today for follow up of severe thrombocytopenia. He was last seen by me on 03/05/23.  Today, he states that he is doing well overall. His appetite level is at 100%. His energy level is at 50%.  PAST MEDICAL HISTORY:   Past Medical History: Past Medical History:  Diagnosis Date   Arthritis    Cirrhosis (HCC)    Depression    Diabetes (HCC)    Umbilical hernia     Surgical History: Past Surgical History:  Procedure Laterality Date   BIOPSY  03/27/2021   Procedure: BIOPSY;  Surgeon: Lanelle Bal, DO;  [facility];  Service: Endoscopy;;  gastric   COLONOSCOPY N/A 05/14/2016   Procedure: COLONOSCOPY;  Surgeon: West Bali, MD;  [facility]: [facility];  Service: Endoscopy;  Laterality: N/A;  1000 - moved to 1:30 - office to notify pt   COLONOSCOPY  WITH PROPOFOL N/A 03/27/2021   Procedure: COLONOSCOPY WITH PROPOFOL;  Surgeon: Lanelle Bal, DO;  [facility]: [facility];  Service: Endoscopy;  Laterality: N/A;  1:30pm   ESOPHAGOGASTRODUODENOSCOPY N/A 11/01/2015   Procedure: ESOPHAGOGASTRODUODENOSCOPY (EGD);  Surgeon: West Bali, MD;  [facility]: [facility];  Service: Endoscopy;  Laterality: N/A;  245 - moved to 2/1 - office to notify    ESOPHAGOGASTRODUODENOSCOPY (EGD) WITH PROPOFOL N/A 03/27/2021   Procedure: ESOPHAGOGASTRODUODENOSCOPY (EGD) WITH  PROPOFOL;  Surgeon: Lanelle Bal, DO;  [facility];  Service: Endoscopy;  Laterality: N/A;   ESOPHAGOGASTRODUODENOSCOPY (EGD) WITH PROPOFOL N/A 09/09/2023   Procedure: ESOPHAGOGASTRODUODENOSCOPY (EGD) WITH PROPOFOL;  Surgeon: Lanelle Bal, DO;  [facility];  Service: Endoscopy;  Laterality: N/A;  800am, asa 3   FOOT SURGERY Right    club foot repair   HAND SURGERY Right    tendon repair   KNEE ARTHROSCOPY WITH MEDIAL MENISECTOMY Left 07/02/2023   Procedure: KNEE ARTHROSCOPY WITH MEDIAL MENISCECTOMY;  Surgeon: Vickki Hearing, MD;  Location: [facility];  Service: Orthopedics;  Laterality: Left;   KNEE SURGERY Right    POLYPECTOMY  03/27/2021   Procedure: POLYPECTOMY;  Surgeon: Lanelle Bal, DO;  [facility];  Service: Endoscopy;;    Social History: Social History   Socioeconomic History   Marital status: Married    Spouse name: Not on file   Number of children: Not on file   Years of education: Not on file   Highest education level: Not on file  Occupational History   Not on file  Tobacco Use   Smoking status: Former    Current packs/day: 0.00    Average packs/day: 0.3 packs/day for 10.0 years (2.5 ttl pk-yrs)    Types: Cigarettes    Start date: 02/26/1989    Quit date: 02/27/1999    Years since quitting: 24.6    Passive exposure: Past   Smokeless tobacco: Never  Vaping Use   Vaping status: Never Used  Substance and Sexual Activity   Alcohol use: No   Drug use: No   Sexual activity: Never  Other Topics Concern   Not on file  Social History Narrative   Not on file   Social Drivers of Health   Financial Resource Strain: Not on file  Food Insecurity: No Food Insecurity (02/03/2023)   Hunger Vital Sign    Worried About Running Out of Food in the Last Year: Never true    Ran Out of Food in the Last Year: Never true  Transportation Needs: No Transportation Needs (02/03/2023)   PRAPARE - Administrator, Civil Service  (Medical): No    Lack of Transportation (Non-Medical): No  Physical Activity: Not on file  Stress: Not on file  Social Connections: Not on file  Intimate Partner Violence: Not At Risk (02/03/2023)   Humiliation, Afraid, Rape, and Kick questionnaire    Fear of Current or Ex-Partner: No    Emotionally Abused: No    Physically Abused: No    Sexually Abused: No    Family History: Family History  Problem Relation Age of Onset   Colon cancer Neg Hx    Colon polyps Neg Hx    Stomach cancer Neg Hx    Pancreatic cancer Neg Hx     Current Medications:  Current Outpatient Medications:    atorvastatin (LIPITOR) 10 MG tablet, Take 10 mg by mouth every evening., Disp: , Rfl:    ibuprofen (ADVIL)  800 MG tablet, TAKE 1 TABLET BY MOUTH EVERY 8 HOURS AS NEEDED, Disp: 90 tablet, Rfl: 1   JARDIANCE 25 MG TABS tablet, Take 25 mg by mouth in the morning., Disp: , Rfl:    metFORMIN (GLUCOPHAGE) 500 MG tablet, Take 500 mg by mouth in the morning., Disp: , Rfl:    OZEMPIC, 2 MG/DOSE, 8 MG/3ML SOPN, Inject 2 mg into the skin every Sunday., Disp: , Rfl:    Allergies: No Known Allergies  REVIEW OF SYSTEMS:   Review of Systems  Constitutional:  Negative for chills, fatigue and fever.  HENT:   Negative for lump/mass, mouth sores, nosebleeds, sore throat and trouble swallowing.   Eyes:  Negative for eye problems.  Respiratory:  Negative for cough and shortness of breath.   Cardiovascular:  Positive for palpitations. Negative for chest pain and leg swelling.  Gastrointestinal:  Positive for diarrhea. Negative for abdominal pain, constipation, nausea and vomiting.  Genitourinary:  Negative for bladder incontinence, difficulty urinating, dysuria, frequency, hematuria and nocturia.   Musculoskeletal:  Negative for arthralgias (Left knee), back pain, flank pain, myalgias and neck pain.  Skin:  Negative for itching and rash.  Neurological:  Negative for dizziness, headaches and numbness.  Hematological:   Does not bruise/bleed easily.  Psychiatric/Behavioral:  Negative for depression, sleep disturbance and suicidal ideas. The patient is not nervous/anxious.   All other systems reviewed and are negative.    VITALS:   Blood pressure 126/71, pulse 74, temperature 98.5 F (36.9 C), temperature source Tympanic, resp. rate 18, weight 276 lb 3.2 oz (125.3 kg), SpO2 96%.  Wt Readings from Last 3 Encounters:  09/29/23 276 lb 3.2 oz (125.3 kg)  09/09/23 271 lb 3.2 oz (123 kg)  09/05/23 276 lb 14.4 oz (125.6 kg)    Body mass index is 38.52 kg/m.  Performance status (ECOG): 0 - Asymptomatic  PHYSICAL EXAM:   Physical Exam Vitals and nursing note reviewed. Exam conducted with a chaperone present.  Constitutional:      Appearance: Normal appearance.  Cardiovascular:     Rate and Rhythm: Normal rate and regular rhythm.     Pulses: Normal pulses.     Heart sounds: Normal heart sounds.  Pulmonary:     Effort: Pulmonary effort is normal.     Breath sounds: Normal breath sounds.  Abdominal:     Palpations: Abdomen is soft. There is no hepatomegaly, splenomegaly or mass.     Tenderness: There is no abdominal tenderness.  Musculoskeletal:     Right lower leg: No edema.     Left lower leg: No edema.  Lymphadenopathy:     Cervical: No cervical adenopathy.     Right cervical: No superficial, deep or posterior cervical adenopathy.    Left cervical: No superficial, deep or posterior cervical adenopathy.     Upper Body:     Right upper body: No supraclavicular or axillary adenopathy.     Left upper body: No supraclavicular or axillary adenopathy.  Neurological:     General: No focal deficit present.     Mental Status: He is alert and oriented to person, place, and time.  Psychiatric:        Mood and Affect: Mood normal.        Behavior: Behavior normal.     LABS:   CBC     Component Value Date/Time   WBC 3.5 (L) 09/22/2023 1040   RBC 4.94 09/22/2023 1040   HGB 15.0 09/22/2023 1040    HGB 15.2  11/12/2016 0933   HCT 43.4 09/22/2023 1040   HCT 44.8 11/12/2016 0933   PLT 62 (L) 09/22/2023 1040   PLT 108 (L) 11/12/2016 0933   MCV 87.9 09/22/2023 1040   MCV 92 11/12/2016 0933   MCH 30.4 09/22/2023 1040   MCHC 34.6 09/22/2023 1040   RDW 13.1 09/22/2023 1040   RDW 13.1 11/12/2016 0933   LYMPHSABS 1.0 09/22/2023 1040   LYMPHSABS 1.3 11/12/2016 0933   MONOABS 0.3 09/22/2023 1040   EOSABS 0.1 09/22/2023 1040   EOSABS 0.1 11/12/2016 0933   BASOSABS 0.0 09/22/2023 1040   BASOSABS 0.1 11/12/2016 0933    CMP      Component Value Date/Time   NA 138 09/05/2023 1012   NA 143 07/04/2021 1128   K 3.7 09/05/2023 1012   CL 106 09/05/2023 1012   CO2 23 09/05/2023 1012   GLUCOSE 208 (H) 09/05/2023 1012   BUN 9 09/05/2023 1012   BUN 11 07/04/2021 1128   CREATININE 0.66 09/05/2023 1012   CREATININE 0.77 05/21/2023 1137   CALCIUM 8.8 (L) 09/05/2023 1012   PROT 7.0 09/05/2023 1012   PROT 7.1 07/04/2021 1128   ALBUMIN 3.6 09/05/2023 1012   ALBUMIN 4.3 07/04/2021 1128   AST 46 (H) 09/05/2023 1012   ALT 40 09/05/2023 1012   ALKPHOS 165 (H) 09/05/2023 1012   BILITOT 1.3 (H) 09/05/2023 1012   BILITOT 0.8 07/04/2021 1128   GFRNONAA >60 09/05/2023 1012   GFRNONAA >89 04/11/2016 1156   GFRAA >89 04/11/2016 1156     No results found for: "CEA1", "CEA" / No results found for: "CEA1", "CEA" No results found for: "PSA1" No results found for: "CAN199" No results found for: "CAN125"  Lab Results  Component Value Date   TOTALPROTELP 6.7 02/03/2023   ALBUMINELP 3.4 02/03/2023   A1GS 0.2 02/03/2023   A2GS 0.6 02/03/2023   BETS 1.3 02/03/2023   GAMS 1.2 02/03/2023   MSPIKE Not Observed 02/03/2023   SPEI Comment 02/03/2023   Lab Results  Component Value Date   TIBC 326 07/04/2021   FERRITIN 180 07/04/2021   IRONPCTSAT 23 07/04/2021   Lab Results  Component Value Date   LDH 174 02/03/2023     STUDIES:   No results found.

## 2023-09-29 ENCOUNTER — Inpatient Hospital Stay (HOSPITAL_BASED_OUTPATIENT_CLINIC_OR_DEPARTMENT_OTHER): Payer: HMO | Admitting: Hematology

## 2023-09-29 VITALS — BP 126/71 | HR 74 | Temp 98.5°F | Resp 18 | Wt 276.2 lb

## 2023-09-29 DIAGNOSIS — D696 Thrombocytopenia, unspecified: Secondary | ICD-10-CM

## 2023-09-29 NOTE — Patient Instructions (Signed)
[facility] at [facility] Discharge Instructions   You were seen and examined today by Dr. Ellin Saba.  Your platelets have improved to 65,000.   We will see you back in one year. We will repeat your platelet count at that time.   Return as scheduled.   Thank you for choosing [facility] at [facility] to provide your oncology and hematology care.  To afford each patient quality time with our provider, please arrive at least 15 minutes before your scheduled appointment time.   If you have a lab appointment with the Cancer Center please come in thru the Main Entrance and check in at the main information desk.  You need to re-schedule your appointment should you arrive 10 or more minutes late.  We strive to give you quality time with our providers, and arriving late affects you and other patients whose appointments are after yours.  Also, if you no show three or more times for appointments you may be dismissed from the clinic at the providers discretion.     Again, thank you for choosing [facility].  Our hope is that these requests will decrease the amount of time that you wait before being seen by our physicians.       _____________________________________________________________  Should you have questions after your visit to [facility], please contact our office at [phone] and follow the prompts.  Our office hours are 8:00 a.m. and 4:30 p.m. Monday - Friday.  Please note that voicemails left after 4:00 p.m. may not be returned until the following business day.  We are closed weekends and major holidays.  You do have access to a nurse 24-7, just call the main number to the clinic [phone] and do not press any options, hold on the line and a nurse will answer the phone.    For prescription refill requests, have your pharmacy contact our office and allow 72 hours.    Due to Covid, you will need to wear a mask  upon entering the hospital. If you do not have a mask, a mask will be given to you at the Main Entrance upon arrival. For doctor visits, patients may have 1 support person age 28 or older with them. For treatment visits, patients can not have anyone with them due to social distancing guidelines and our immunocompromised population.

## 2023-11-03 ENCOUNTER — Ambulatory Visit: Payer: HMO | Admitting: Orthopedic Surgery

## 2023-11-10 ENCOUNTER — Other Ambulatory Visit: Payer: Self-pay | Admitting: Orthopedic Surgery

## 2023-12-08 ENCOUNTER — Encounter: Payer: Self-pay | Admitting: Gastroenterology

## 2024-05-21 ENCOUNTER — Encounter: Payer: Self-pay | Admitting: Radiology

## 2024-08-02 ENCOUNTER — Encounter: Payer: Self-pay | Admitting: Radiology

## 2024-09-27 ENCOUNTER — Encounter: Payer: Self-pay | Admitting: *Deleted

## 2024-09-27 ENCOUNTER — Inpatient Hospital Stay: Attending: Oncology

## 2024-09-27 DIAGNOSIS — D696 Thrombocytopenia, unspecified: Secondary | ICD-10-CM | POA: Diagnosis present

## 2024-09-27 DIAGNOSIS — Z79899 Other long term (current) drug therapy: Secondary | ICD-10-CM | POA: Diagnosis not present

## 2024-09-27 LAB — CBC WITH DIFFERENTIAL/PLATELET
Abs Immature Granulocytes: 0.01 K/uL (ref 0.00–0.07)
Basophils Absolute: 0 K/uL (ref 0.0–0.1)
Basophils Relative: 1 %
Eosinophils Absolute: 0.2 K/uL (ref 0.0–0.5)
Eosinophils Relative: 4 %
HCT: 46.8 % (ref 39.0–52.0)
Hemoglobin: 16.3 g/dL (ref 13.0–17.0)
Immature Granulocytes: 0 %
Lymphocytes Relative: 25 %
Lymphs Abs: 1 K/uL (ref 0.7–4.0)
MCH: 30.1 pg (ref 26.0–34.0)
MCHC: 34.8 g/dL (ref 30.0–36.0)
MCV: 86.5 fL (ref 80.0–100.0)
Monocytes Absolute: 0.3 K/uL (ref 0.1–1.0)
Monocytes Relative: 6 %
Neutro Abs: 2.5 K/uL (ref 1.7–7.7)
Neutrophils Relative %: 64 %
Platelets: 51 K/uL — ABNORMAL LOW (ref 150–400)
RBC: 5.41 MIL/uL (ref 4.22–5.81)
RDW: 12.9 % (ref 11.5–15.5)
WBC: 3.9 K/uL — ABNORMAL LOW (ref 4.0–10.5)
nRBC: 0 % (ref 0.0–0.2)

## 2024-09-28 ENCOUNTER — Inpatient Hospital Stay: Payer: HMO

## 2024-10-05 ENCOUNTER — Inpatient Hospital Stay: Payer: HMO | Attending: Physician Assistant | Admitting: Oncology

## 2024-10-05 VITALS — BP 128/76 | HR 77 | Temp 97.9°F | Resp 18 | Ht 71.0 in | Wt 261.0 lb

## 2024-10-05 DIAGNOSIS — Z8379 Family history of other diseases of the digestive system: Secondary | ICD-10-CM | POA: Diagnosis not present

## 2024-10-05 DIAGNOSIS — E669 Obesity, unspecified: Secondary | ICD-10-CM | POA: Diagnosis not present

## 2024-10-05 DIAGNOSIS — K766 Portal hypertension: Secondary | ICD-10-CM | POA: Insufficient documentation

## 2024-10-05 DIAGNOSIS — K921 Melena: Secondary | ICD-10-CM | POA: Insufficient documentation

## 2024-10-05 DIAGNOSIS — K7460 Unspecified cirrhosis of liver: Secondary | ICD-10-CM | POA: Diagnosis not present

## 2024-10-05 DIAGNOSIS — Z79899 Other long term (current) drug therapy: Secondary | ICD-10-CM | POA: Insufficient documentation

## 2024-10-05 DIAGNOSIS — R161 Splenomegaly, not elsewhere classified: Secondary | ICD-10-CM | POA: Insufficient documentation

## 2024-10-05 DIAGNOSIS — M2550 Pain in unspecified joint: Secondary | ICD-10-CM | POA: Diagnosis not present

## 2024-10-05 DIAGNOSIS — D696 Thrombocytopenia, unspecified: Secondary | ICD-10-CM | POA: Diagnosis not present

## 2024-10-05 DIAGNOSIS — Z803 Family history of malignant neoplasm of breast: Secondary | ICD-10-CM | POA: Diagnosis not present

## 2024-10-05 DIAGNOSIS — E119 Type 2 diabetes mellitus without complications: Secondary | ICD-10-CM | POA: Diagnosis not present

## 2024-10-05 DIAGNOSIS — R5383 Other fatigue: Secondary | ICD-10-CM | POA: Diagnosis not present

## 2024-10-05 DIAGNOSIS — K2970 Gastritis, unspecified, without bleeding: Secondary | ICD-10-CM | POA: Diagnosis not present

## 2024-10-05 DIAGNOSIS — K649 Unspecified hemorrhoids: Secondary | ICD-10-CM | POA: Insufficient documentation

## 2024-10-05 DIAGNOSIS — M25532 Pain in left wrist: Secondary | ICD-10-CM | POA: Diagnosis not present

## 2024-10-05 DIAGNOSIS — Z808 Family history of malignant neoplasm of other organs or systems: Secondary | ICD-10-CM | POA: Insufficient documentation

## 2024-10-05 NOTE — Assessment & Plan Note (Addendum)
-   He does not report any significant bleeding or bruising after last 12 months. - He lost about 10 pounds as he is on Ozempic for his diabetes. - He had a EGD on 09/09/2023: No varices.  Gastritis was seen. - He also had left knee surgery without any bleeding issues. -Patient had ultrasound of his abdomen on 06/20/2023 for cirrhosis follow-up which revealed no focal liver lesions. -Most recent CBC is from 09/27/2024 which shows a platelet count of 51,000.  This is more or less stable from previous.  White blood cell count is 3.9 with a normal ANC.  Hemoglobin is normal. -Previous labs showed an elevated immature platelet fraction at 17.6. Question if there is any peripheral destruction of platelets. - Recommend follow-up in 1 year with repeat platelet count.

## 2024-10-05 NOTE — Progress Notes (Signed)
 "  [name] [name]  Nsumanganyi, [name] Elizabeth, NP  ASSESSMENT & PLAN:    Assessment & Plan Thrombocytopenia - He does not report any significant bleeding or bruising after last 12 months. - He lost about 10 pounds as he is on Ozempic for his diabetes. - He had a EGD on 09/09/2023: No varices.  Gastritis was seen. - He also had left knee surgery without any bleeding issues. -Patient had ultrasound of his abdomen on 06/20/2023 for cirrhosis follow-up which revealed no focal liver lesions. -Most recent CBC is from 09/27/2024 which shows a platelet count of 51,000.  This is more or less stable from previous.  White blood cell count is 3.9 with a normal ANC.  Hemoglobin is normal. -Previous labs showed an elevated immature platelet fraction at 17.6. Question if there is any peripheral destruction of platelets. - Recommend follow-up in 1 year with repeat platelet count.  Orders Placed This Encounter  Procedures   CBC with Differential    Standing Status:   Future    Expected Date:   10/05/2025    Expiration Date:   10/05/2025    INTERVAL HISTORY: Patient returns for follow-up.  [name] [name] is a 59 y.o. male presenting to clinic today for follow up of severe thrombocytopenia in the setting of cirrhosis.  He denies any recent hospitalizations, surgeries or changes to baseline health.  Today, he states that he is doing well overall.   Reports he recently had a tooth pulled and was on antibiotics.  No recent infections.  Appetite is 100% energy levels are 40%.  Reports he has noticed left wrist pain secondary to playing the guitar.  Reports he is going to reach out to his PCP to potentially have this looked at.  Reports this has been an ongoing issue but has worsened over the past couple of months.  He denies any melena or hematochezia.  He occasionally will have hemorrhoidal bleeding but this is self-limiting.  We reviewed CBC.  SUMMARY OF HEMATOLOGIC  HISTORY: Oncology History   No problem history exists.    1.  Severe thrombocytopenia: - Patient seen at the request of [name] Elizabeth, NP - PLT (11/24/2022): 48, 76 (02/2018), 108 (10/2016), 130 (10/2015) - CT abdomen (12/16/2022): Cirrhosis with sequela of portal hypertension, splenomegaly 17.8 cm. - Previous workup for vitamin deficiencies, SPEP, infections were negative. - Thrombocytopenia was thought to be from splenomegaly and splenic sequestration.   2.  Social/family history: - He is seen with his wife [name] [name].  He works as a psychologist, occupational.  Non-smoker.  Nonalcoholic. - Mother had nonalcoholic fatty liver disease.  Father had cirrhosis.  Maternal aunt had breast cancer.  Maternal grandmother had breast cancer.  Brother had throat cancer. CBC    Component Value Date/Time   WBC 3.9 (L) 09/27/2024 0853   RBC 5.41 09/27/2024 0853   HGB 16.3 09/27/2024 0853   HGB 15.2 11/12/2016 0933   HCT 46.8 09/27/2024 0853   HCT 44.8 11/12/2016 0933   PLT 51 (L) 09/27/2024 0853   PLT 108 (L) 11/12/2016 0933   MCV 86.5 09/27/2024 0853   MCV 92 11/12/2016 0933   MCH 30.1 09/27/2024 0853   MCHC 34.8 09/27/2024 0853   RDW 12.9 09/27/2024 0853   RDW 13.1 11/12/2016 0933   LYMPHSABS 1.0 09/27/2024 0853   LYMPHSABS 1.3 11/12/2016 0933   MONOABS 0.3 09/27/2024 0853   EOSABS 0.2 09/27/2024 0853   EOSABS 0.1 11/12/2016 0933   BASOSABS 0.0 09/27/2024 0853  BASOSABS 0.1 11/12/2016 0933       Latest Ref Rng & Units 09/05/2023   10:12 AM 06/27/2023   12:00 PM 05/21/2023   11:37 AM  CMP  Glucose 70 - 99 mg/dL 791  673  747   BUN 6 - 20 mg/dL 9  8  8    Creatinine 0.61 - 1.24 mg/dL 9.33  9.29  9.22   Sodium 135 - 145 mmol/L 138  135  140   Potassium 3.5 - 5.1 mmol/L 3.7  3.6  3.9   Chloride 98 - 111 mmol/L 106  103  106   CO2 22 - 32 mmol/L 23  23  26    Calcium 8.9 - 10.3 mg/dL 8.8  8.4  9.2   Total Protein 6.5 - 8.1 g/dL 7.0   6.9   Total Bilirubin <1.2 mg/dL 1.3   1.1   Alkaline Phos 38 - 126 U/L 165      AST 15 - 41 U/L 46   59   ALT 0 - 44 U/L 40   44      Lab Results  Component Value Date   FERRITIN 180 07/04/2021   VITAMINB12 379 02/03/2023    Vitals:   10/05/24 0957 10/05/24 0958  BP: (!) 143/76 128/76  Pulse: 77   Resp: 18   Temp: 97.9 F (36.6 C)   SpO2: 97%     Review of System:  Review of Systems  Constitutional:  Positive for malaise/fatigue. Negative for weight loss.  Gastrointestinal:  Positive for blood in stool (Hemorrhoids).  Musculoskeletal:  Positive for joint pain.    Physical Exam: Physical Exam Constitutional:      Appearance: Normal appearance. He is obese.  HENT:     Head: Normocephalic and atraumatic.  Eyes:     Pupils: Pupils are equal, round, and reactive to light.  Cardiovascular:     Rate and Rhythm: Normal rate and regular rhythm.     Heart sounds: Normal heart sounds. No murmur heard. Pulmonary:     Effort: Pulmonary effort is normal.     Breath sounds: Normal breath sounds. No wheezing.  Abdominal:     General: Bowel sounds are normal. There is no distension.     Palpations: Abdomen is soft.     Tenderness: There is no abdominal tenderness.  Musculoskeletal:        General: Normal range of motion.     Cervical back: Normal range of motion.  Skin:    General: Skin is warm and dry.     Findings: No rash.  Neurological:     Mental Status: He is alert and oriented to person, place, and time.     Gait: Gait is intact.  Psychiatric:        Mood and Affect: Mood and affect normal.        Cognition and Memory: Memory normal.        Judgment: Judgment normal.      I spent 20 minutes dedicated to the care of this patient (face-to-face and non-face-to-face) on the date of the encounter to include what is described in the assessment and plan.,  [name] Hope, NP 10/05/2024 10:31 AM "

## 2025-10-04 ENCOUNTER — Inpatient Hospital Stay

## 2025-10-11 ENCOUNTER — Inpatient Hospital Stay: Admitting: Oncology
